# Patient Record
Sex: Male | Born: 1935 | Race: White | Hispanic: No | Marital: Married | Smoking: Former smoker
Health system: Southern US, Community
[De-identification: ages and names within clinical notes are randomized; demographics above are authoritative.]

## PROBLEM LIST (undated history)

## (undated) DIAGNOSIS — K219 Gastro-esophageal reflux disease without esophagitis: Secondary | ICD-10-CM

## (undated) DIAGNOSIS — Z860100 Personal history of colon polyps, unspecified: Secondary | ICD-10-CM

## (undated) DIAGNOSIS — M1990 Unspecified osteoarthritis, unspecified site: Secondary | ICD-10-CM

## (undated) DIAGNOSIS — F0390 Unspecified dementia without behavioral disturbance: Secondary | ICD-10-CM

## (undated) DIAGNOSIS — K5732 Diverticulitis of large intestine without perforation or abscess without bleeding: Secondary | ICD-10-CM

## (undated) DIAGNOSIS — I639 Cerebral infarction, unspecified: Secondary | ICD-10-CM

## (undated) DIAGNOSIS — K859 Acute pancreatitis without necrosis or infection, unspecified: Secondary | ICD-10-CM

## (undated) DIAGNOSIS — N138 Other obstructive and reflux uropathy: Secondary | ICD-10-CM

## (undated) DIAGNOSIS — I10 Essential (primary) hypertension: Secondary | ICD-10-CM

## (undated) DIAGNOSIS — R1312 Dysphagia, oropharyngeal phase: Secondary | ICD-10-CM

## (undated) DIAGNOSIS — J302 Other seasonal allergic rhinitis: Secondary | ICD-10-CM

## (undated) DIAGNOSIS — N401 Enlarged prostate with lower urinary tract symptoms: Secondary | ICD-10-CM

## (undated) DIAGNOSIS — J852 Abscess of lung without pneumonia: Secondary | ICD-10-CM

## (undated) DIAGNOSIS — E785 Hyperlipidemia, unspecified: Secondary | ICD-10-CM

## (undated) HISTORY — DX: Other seasonal allergic rhinitis: J30.2

## (undated) HISTORY — DX: Diverticulitis of large intestine without perforation or abscess without bleeding: K57.32

## (undated) HISTORY — DX: Abscess of lung without pneumonia: J85.2

## (undated) HISTORY — PX: APPENDECTOMY: SHX54

## (undated) HISTORY — DX: Benign prostatic hyperplasia with lower urinary tract symptoms: N13.8

## (undated) HISTORY — DX: Gastro-esophageal reflux disease without esophagitis: K21.9

## (undated) HISTORY — DX: Hyperlipidemia, unspecified: E78.5

## (undated) HISTORY — DX: Other obstructive and reflux uropathy: N40.1

## (undated) HISTORY — DX: Dysphagia, oropharyngeal phase: R13.12

## (undated) HISTORY — DX: Cerebral infarction, unspecified: I63.9

## (undated) HISTORY — DX: Personal history of colon polyps, unspecified: Z86.0100

## (undated) HISTORY — PX: CHOLECYSTECTOMY: SHX55

## (undated) HISTORY — DX: Unspecified osteoarthritis, unspecified site: M19.90

## (undated) HISTORY — DX: Acute pancreatitis without necrosis or infection, unspecified: K85.90

## (undated) HISTORY — DX: Personal history of colonic polyps: Z86.010

## (undated) HISTORY — DX: Essential (primary) hypertension: I10

---

## 1991-08-21 HISTORY — PX: BRONCHOSCOPY: SUR163

## 2000-05-06 ENCOUNTER — Encounter: Payer: Self-pay | Admitting: Endocrinology

## 2000-05-06 ENCOUNTER — Encounter: Admission: RE | Admit: 2000-05-06 | Discharge: 2000-05-06 | Payer: Self-pay | Admitting: Endocrinology

## 2000-05-09 ENCOUNTER — Ambulatory Visit: Admission: RE | Admit: 2000-05-09 | Discharge: 2000-05-09 | Payer: Self-pay | Admitting: Internal Medicine

## 2000-05-09 ENCOUNTER — Encounter (INDEPENDENT_AMBULATORY_CARE_PROVIDER_SITE_OTHER): Payer: Self-pay | Admitting: Specialist

## 2000-07-09 ENCOUNTER — Inpatient Hospital Stay (HOSPITAL_COMMUNITY): Admission: AD | Admit: 2000-07-09 | Discharge: 2000-07-10 | Payer: Self-pay | Admitting: Endocrinology

## 2000-07-09 ENCOUNTER — Encounter: Payer: Self-pay | Admitting: Endocrinology

## 2000-07-10 ENCOUNTER — Encounter: Payer: Self-pay | Admitting: Endocrinology

## 2000-07-16 ENCOUNTER — Ambulatory Visit (HOSPITAL_COMMUNITY): Admission: RE | Admit: 2000-07-16 | Discharge: 2000-07-16 | Payer: Self-pay | Admitting: Endocrinology

## 2001-04-08 ENCOUNTER — Encounter: Payer: Self-pay | Admitting: Endocrinology

## 2001-04-08 ENCOUNTER — Encounter: Admission: RE | Admit: 2001-04-08 | Discharge: 2001-04-08 | Payer: Self-pay | Admitting: Endocrinology

## 2001-11-04 ENCOUNTER — Encounter: Payer: Self-pay | Admitting: Endocrinology

## 2001-11-04 ENCOUNTER — Ambulatory Visit (HOSPITAL_COMMUNITY): Admission: RE | Admit: 2001-11-04 | Discharge: 2001-11-04 | Payer: Self-pay | Admitting: Endocrinology

## 2002-05-07 ENCOUNTER — Encounter (INDEPENDENT_AMBULATORY_CARE_PROVIDER_SITE_OTHER): Payer: Self-pay

## 2002-05-07 ENCOUNTER — Ambulatory Visit (HOSPITAL_COMMUNITY): Admission: RE | Admit: 2002-05-07 | Discharge: 2002-05-07 | Payer: Self-pay | Admitting: Gastroenterology

## 2004-08-28 ENCOUNTER — Ambulatory Visit (HOSPITAL_COMMUNITY): Admission: RE | Admit: 2004-08-28 | Discharge: 2004-08-28 | Payer: Self-pay | Admitting: Ophthalmology

## 2005-10-22 ENCOUNTER — Emergency Department (HOSPITAL_COMMUNITY): Admission: EM | Admit: 2005-10-22 | Discharge: 2005-10-22 | Payer: Self-pay | Admitting: Family Medicine

## 2007-04-08 ENCOUNTER — Observation Stay (HOSPITAL_COMMUNITY): Admission: EM | Admit: 2007-04-08 | Discharge: 2007-04-10 | Payer: Self-pay | Admitting: Emergency Medicine

## 2010-03-27 ENCOUNTER — Encounter: Admission: RE | Admit: 2010-03-27 | Discharge: 2010-03-27 | Payer: Self-pay | Admitting: Internal Medicine

## 2010-05-02 ENCOUNTER — Emergency Department (HOSPITAL_COMMUNITY): Admission: EM | Admit: 2010-05-02 | Discharge: 2010-05-02 | Payer: Self-pay | Admitting: Family Medicine

## 2010-07-13 ENCOUNTER — Emergency Department (HOSPITAL_COMMUNITY): Admission: EM | Admit: 2010-07-13 | Discharge: 2010-07-13 | Payer: Self-pay | Admitting: Emergency Medicine

## 2010-09-10 ENCOUNTER — Encounter: Payer: Self-pay | Admitting: Family Medicine

## 2010-10-31 LAB — CBC
HCT: 30.3 % — ABNORMAL LOW (ref 39.0–52.0)
MCV: 97.1 fL (ref 78.0–100.0)
Platelets: 280 10*3/uL (ref 150–400)
RBC: 3.12 MIL/uL — ABNORMAL LOW (ref 4.22–5.81)
RDW: 14.4 % (ref 11.5–15.5)
WBC: 6.4 10*3/uL (ref 4.0–10.5)

## 2010-10-31 LAB — DIFFERENTIAL
Basophils Absolute: 0 10*3/uL (ref 0.0–0.1)
Basophils Relative: 0 % (ref 0–1)
Eosinophils Absolute: 0.1 10*3/uL (ref 0.0–0.7)
Neutro Abs: 4.6 10*3/uL (ref 1.7–7.7)
Neutrophils Relative %: 71 % (ref 43–77)

## 2010-10-31 LAB — URINALYSIS, ROUTINE W REFLEX MICROSCOPIC
Hgb urine dipstick: NEGATIVE
Ketones, ur: NEGATIVE mg/dL
Urobilinogen, UA: 0.2 mg/dL (ref 0.0–1.0)

## 2010-10-31 LAB — COMPREHENSIVE METABOLIC PANEL
ALT: 29 U/L (ref 0–53)
AST: 20 U/L (ref 0–37)
Albumin: 3.1 g/dL — ABNORMAL LOW (ref 3.5–5.2)
Alkaline Phosphatase: 84 U/L (ref 39–117)
BUN: 15 mg/dL (ref 6–23)
CO2: 27 mEq/L (ref 19–32)
GFR calc Af Amer: >60 mL/min (ref >60)
Glucose, Bld: 112 mg/dL — ABNORMAL HIGH (ref 70–99)
Potassium: 3.7 mEq/L (ref 3.5–5.1)
Sodium: 137 mEq/L (ref 135–145)

## 2010-10-31 LAB — TYPE AND SCREEN

## 2010-11-16 ENCOUNTER — Encounter: Payer: Self-pay | Admitting: Internal Medicine

## 2010-11-16 ENCOUNTER — Ambulatory Visit (INDEPENDENT_AMBULATORY_CARE_PROVIDER_SITE_OTHER): Payer: Medicare Other | Admitting: Internal Medicine

## 2010-11-16 DIAGNOSIS — N400 Enlarged prostate without lower urinary tract symptoms: Secondary | ICD-10-CM | POA: Insufficient documentation

## 2010-11-16 DIAGNOSIS — Z860100 Personal history of colon polyps, unspecified: Secondary | ICD-10-CM

## 2010-11-16 DIAGNOSIS — E785 Hyperlipidemia, unspecified: Secondary | ICD-10-CM

## 2010-11-16 DIAGNOSIS — N138 Other obstructive and reflux uropathy: Secondary | ICD-10-CM

## 2010-11-16 DIAGNOSIS — M1990 Unspecified osteoarthritis, unspecified site: Secondary | ICD-10-CM | POA: Insufficient documentation

## 2010-11-16 DIAGNOSIS — Z8719 Personal history of other diseases of the digestive system: Secondary | ICD-10-CM | POA: Insufficient documentation

## 2010-11-16 DIAGNOSIS — R911 Solitary pulmonary nodule: Secondary | ICD-10-CM

## 2010-11-16 DIAGNOSIS — K219 Gastro-esophageal reflux disease without esophagitis: Secondary | ICD-10-CM

## 2010-11-16 DIAGNOSIS — I6350 Cerebral infarction due to unspecified occlusion or stenosis of unspecified cerebral artery: Secondary | ICD-10-CM

## 2010-11-16 DIAGNOSIS — K209 Esophagitis, unspecified without bleeding: Secondary | ICD-10-CM

## 2010-11-16 DIAGNOSIS — M129 Arthropathy, unspecified: Secondary | ICD-10-CM

## 2010-11-16 DIAGNOSIS — Z23 Encounter for immunization: Secondary | ICD-10-CM

## 2010-11-16 DIAGNOSIS — Z0000 Encounter for general adult medical examination without abnormal findings: Secondary | ICD-10-CM

## 2010-11-16 DIAGNOSIS — I10 Essential (primary) hypertension: Secondary | ICD-10-CM

## 2010-11-16 DIAGNOSIS — N401 Enlarged prostate with lower urinary tract symptoms: Secondary | ICD-10-CM

## 2010-11-16 DIAGNOSIS — H409 Unspecified glaucoma: Secondary | ICD-10-CM

## 2010-11-16 DIAGNOSIS — Z8673 Personal history of transient ischemic attack (TIA), and cerebral infarction without residual deficits: Secondary | ICD-10-CM | POA: Insufficient documentation

## 2010-11-16 DIAGNOSIS — I639 Cerebral infarction, unspecified: Secondary | ICD-10-CM

## 2010-11-16 MED ORDER — ZOSTER VACCINE LIVE 19400 UNT/0.65ML ~~LOC~~ SOLR
0.6500 mL | Freq: Once | SUBCUTANEOUS | Status: AC
  Administered 2010-11-16: 19400 [IU] via SUBCUTANEOUS

## 2010-11-16 NOTE — Progress Notes (Deleted)
Subjective:    Alexander Goodwin is a 77 y.o. male who presents to establish for on-going continuity care as a Dr. Dagoberto Ligas refugee.   He reports that he has had on-going right knee pain despite cortisone shots. He is having intra-orbital hemorrhage - seeing Dr. Eulah Pont for laser surgery.   He is for a annual medicare wellness exam. He has no specific medical issues to address today except as above.  He is 100% independent in all ADLs. He has had no falls and has mildly increased fall risk due to his bad knee. He has no signs or symptoms of depression. He remains very active and functional having just retired at age 60. His wife reports that he seems to have some memory problems which will be further evaluated at follow-up. He eats a healthy diet; does not smoke or drink. He does not exercise on a regular basis although he remains very active. Home safety: he has smoke alarms in the house; there are firearms in the house and he reports that ammunition is kept separate. There is no violence in the home. He has no risk factors for STDs, Hepatitis or HIV. He does see his dentist on a regular basis. He does not have excessive sun exposure. He has never had a blood transfusion. End of life care is discussed today.    Past Medical History  Diagnosis Date  . Diverticulitis of colon   . History of colon polyps   . Arthritis     right knee, hips, neck. He denies any major limitation in activities  . GERD (gastroesophageal reflux disease)     well controlled with medication. No nocturnal symptoms on a regular basis  . Glaucoma     both eyes, followed closely by Dr. Eulah Pont  . Esophagitis     Last EGD Fall '11   . Solitary lung nodule     LUL, stable over several years. s/p eval LHC-pulmonary  . Pancreatitis     resolved after GB surgery  . Stroke     TIAs - age 40  . BPH (benign prostatic hypertrophy) with urinary obstruction    Past Surgical History  Procedure Date  . Cholecystectomy     '88  - laparotomy ([place])  . Appendectomy     '88 along with GB   Family History  Problem Relation Age of Onset  . Diabetes Mother   . Hypertension Mother   . Heart disease Mother   . Stroke Father   . Heart disease Father   . Diabetes Father   . Cancer Sister     uterine cancer  . Cancer Brother   . COPD Brother   . Hypertension Brother   . Cancer Brother   . Cancer Sister     gyn malignancy  . Cancer Sister     pancreatic cancer   History   Social History  . Marital Status: Married    Spouse Name: N/A    Number of Children: N/A  . Years of Education: HSG   Occupational History  . 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 Smoking status: Former Smoker    Types: Cigarettes    Quit date: 08/20/1978  . Smokeless tobacco: Former Neurosurgeon    Quit date: 11/16/1990  . Alcohol Use: No  . Drug Use: No  . Sexually Active: Yes   Other Topics Concern  . Not on file   Social  History Narrative   HSG.   Married 1948. 3 sons - '52, '57,'58- 5 grandchildren, one deceased at 46 - OD.  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 Marriage in good health. End of Life Care -DNR/DNI,  No prolonged heroic or futile measures.     Review of Systems  Constitutional: Negative.  Negative for fever, chills, activity change and unexpected weight change.  HENT: Negative.  Negative for ear pain, congestion, neck stiffness and postnasal drip.  He may have mild high frequency hearing loss Eyes:  Negative for pain, discharge and visual disturbance. Has vitreal hemorrhage which is being treated  Respiratory: Negative.  Negative for chest tightness and wheezing.   Cardiovascular: Negative.  Negative for chest pain and palpitations.       [No decreased exercise tolerance Gastrointestinal: Negative.        [No change in bowel habit. No bloating or gas. No reflux or indigestion Genitourinary: Negative.  Negative for urgency,  frequency, flank pain and difficulty urinating.  Musculoskeletal: Negative.  Negative for myalgias, back pain, arthralgias and gait problem.  Neurological: Negative.  Negative for dizziness, tremors, weakness and headaches.  Hematological: Negative.  Negative for adenopathy.  Psychiatric/Behavioral: Negative for behavioral problems and dysphoric mood.  [all other systems reviewed and are negative

## 2010-11-17 ENCOUNTER — Encounter: Payer: Self-pay | Admitting: Internal Medicine

## 2010-11-17 ENCOUNTER — Telehealth: Payer: Self-pay | Admitting: Internal Medicine

## 2010-11-17 DIAGNOSIS — I10 Essential (primary) hypertension: Secondary | ICD-10-CM | POA: Insufficient documentation

## 2010-11-17 DIAGNOSIS — E785 Hyperlipidemia, unspecified: Secondary | ICD-10-CM | POA: Insufficient documentation

## 2010-11-17 NOTE — Progress Notes (Signed)
Subjective:    Patient ID: Alexander Goodwin, male    DOB: 04-10-1921, 77 y.o.   MRN: [id number]  HPI    Subjective:    Alexander Goodwin is a 77 y.o. male who presents to establish for on-going continuity care as a Dr. Dagoberto Ligas refugee.   He reports that he has had on-going right knee pain despite cortisone shots. He is having intra-orbital hemorrhage - seeing Dr. Eulah Pont for laser surgery.   He is for a annual medicare wellness exam. He has no specific medical issues to address today except as above.  He is 100% independent in all ADLs. He has had no falls and has mildly increased fall risk due to his bad knee. He has no signs or symptoms of depression. He remains very active and functional having just retired at age 78. His wife reports that he seems to have some memory problems which will be further evaluated at follow-up. He eats a healthy diet; does not smoke or drink. He does not exercise on a regular basis although he remains very active. Home safety: he has smoke alarms in the house; there are firearms in the house and he reports that ammunition is kept separate. There is no violence in the home. He has no risk factors for STDs, Hepatitis or HIV. He does see his dentist on a regular basis. He does not have excessive sun exposure. He has never had a blood transfusion. End of life care is discussed today.    Past Medical History  Diagnosis Date  . Diverticulitis of colon   . History of colon polyps   . Arthritis     right knee, hips, neck. He denies any major limitation in activities  . GERD (gastroesophageal reflux disease)     well controlled with medication. No nocturnal symptoms on a regular basis  . Glaucoma     both eyes, followed closely by Dr. Eulah Pont  . Esophagitis     Last EGD Fall '11   . Solitary lung nodule     LUL, stable over several years. s/p eval LHC-pulmonary  . Pancreatitis     resolved after GB surgery  . Stroke     TIAs - age 44  . BPH (benign prostatic  hypertrophy) with urinary obstruction    Past Surgical History  Procedure Date  . Cholecystectomy     '88 - laparotomy ([place])  . Appendectomy     '88 along with GB   Family History  Problem Relation Age of Onset  . Diabetes Mother   . Hypertension Mother   . Heart disease Mother   . Stroke Father   . Heart disease Father   . Diabetes Father   . Cancer Sister     uterine cancer  . Cancer Brother   . COPD Brother   . Hypertension Brother   . Cancer Brother   . Cancer Sister     gyn malignancy  . Cancer Sister     pancreatic cancer   History   Social History  . Marital Status: Married    Spouse Name: N/A    Number of Children: N/A  . Years of Education: HSG   Occupational History  . 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 Smoking status: Former Smoker    Types: Cigarettes    Quit date: 08/20/1978  . Smokeless tobacco: Former Neurosurgeon    Quit date: 11/16/1990  .  Alcohol Use: No  . Drug Use: No  . Sexually Active: Yes   Other Topics Concern  . Not on file   Social History Narrative   HSG.   Married 1948. 3 sons - '52, '57,'58- 5 grandchildren, one deceased at 37 - OD.  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 Marriage in good health. End of Life Care -DNR/DNI,  No prolonged heroic or futile measures.     Review of Systems  Constitutional: Negative.  Negative for fever, chills, activity change and unexpected weight change.  HENT: Negative.  Negative for ear pain, congestion, neck stiffness and postnasal drip.  He may have mild high frequency hearing loss Eyes:  Negative for pain, discharge and visual disturbance. Has vitreal hemorrhage which is being treated  Respiratory: Negative.  Negative for chest tightness and wheezing.   Cardiovascular: Negative.  Negative for chest pain and palpitations.       [No decreased exercise tolerance Gastrointestinal: Negative.        [No change in  bowel habit. No bloating or gas. No reflux or indigestion Genitourinary: Negative.  Negative for urgency, frequency, flank pain and difficulty urinating.  Musculoskeletal: Negative.  Negative for myalgias, back pain, arthralgias and gait problem.  Neurological: Negative.  Negative for dizziness, tremors, weakness and headaches.  Hematological: Negative.  Negative for adenopathy.  Psychiatric/Behavioral: Negative for behavioral problems and dysphoric mood.  [all other systems reviewed and are negative        Review of Systems     Objective:   Physical Exam  Nursing note and vitals reviewed. Constitutional: He is oriented to person, place, and time. He appears well-developed and well-nourished.       Elderly white male looking younger than his stated age.  HENT:  Head: Normocephalic and atraumatic.       Mild cerumen accumulation bilaterally but TMs remain visible  Eyes: Conjunctivae and EOM are normal. Pupils are equal, round, and reactive to light.  Neck: Normal range of motion. Neck supple. No thyromegaly present.  Cardiovascular: Normal rate, regular rhythm and normal heart sounds.   Pulmonary/Chest: Effort normal and breath sounds normal.  Abdominal: Soft.  Musculoskeletal: Normal range of motion. He exhibits no edema and no tenderness.  Neurological: He is alert and oriented to person, place, and time. He has normal reflexes.  Skin: Skin is warm and dry.       Chronic skin changes with areas of erythema. On the left cheek is a slightly raised red area that appears benign  Psychiatric: He has a normal mood and affect. His behavior is normal.          Assessment & Plan:  1. Arthritis - chronic knee pain and problems followed by Dr. Ranell Patrick. Mr. Chap is not a candidate for total knee replacement but may benefit from hyaluronidase injections.  Plan - defer to orthopedics  2. GERD - stable on present medications  3. Hypertension - well controlled on present medications.  He has had recent labs by Dr. Mertie Moores pending.  4. BPH - stable with minimal nocturia  5. Hyperlipidemia - medical therapy was started at time of CVA/TIA. Will discuss at future visits the overall benefits and risks of continuing medication.  6. Mental status - Mrs. Scheier has raised the question about memory loss.  Plan - will perform a MMSE at next office visit.   In summary - a very nice man who is establishing for on-going medical care. He will return in  the near future for further evaluation.

## 2010-11-17 NOTE — Telephone Encounter (Signed)
Forwarded to Dr. Norins for review. °

## 2010-12-15 ENCOUNTER — Ambulatory Visit (INDEPENDENT_AMBULATORY_CARE_PROVIDER_SITE_OTHER): Payer: Medicare Other | Admitting: Internal Medicine

## 2010-12-15 ENCOUNTER — Encounter: Payer: Self-pay | Admitting: Internal Medicine

## 2010-12-15 DIAGNOSIS — E785 Hyperlipidemia, unspecified: Secondary | ICD-10-CM

## 2010-12-15 DIAGNOSIS — R413 Other amnesia: Secondary | ICD-10-CM

## 2010-12-15 NOTE — Progress Notes (Signed)
  Subjective:    Patient ID: Alexander Goodwin, male    DOB: [birth year], 77 y.o.   MRN: [id number]  HPI Patient returns for follow-up. He has received the note from his new patient visit and has no corrections or additions.  We are also to discuss continuation of anti-lipemic medication and to do a MMSE  PMH, FamHx and SocHx reviewed for any changes and relevance.   Review of Systems No change from last visit. All reviewed systems negative except for some arthritic stiffness    Objective:   Physical Exam MMSE: 1. Day,date,year - OK on all 2. Content: president- ok   Gov. -ok    Current events -ok for general information 3. Number repitition: 5 fwd - 1 error   5 rev - no, 4 rev - 2 errors      World reversed - no 4. 3 word recall - 0/3 @ 5 min. 5. Serial 7's -ok      , nickles in $1.25-  ok     Change making - 1 error but corrected on second try 6. Naming objects -   ok       4 legged creatures- ok 7. Parables:  [place] - poor/concrete      Rolling stone - contcrete 8. Judgement:  Letter  Fair but not just right        [employer]  9. Clock face exercise - fluid        Assessment & Plan:  1. Memory assesment -OK, mild short-term memory loss. Not severe enough to warrant medication.   2. Hyperlipidemia - at age 19 we discussed the relative value of continuing anti-lipemic medication in regard to reducing the risk of progressive atherosclerosis. Little evidence to suggest that medication will make a significant difference at his age.

## 2011-01-02 NOTE — H&P (Signed)
Alexander Goodwin, Alexander Goodwin                ACCOUNT NO.:  [id number]   MEDICAL RECORD NO.:  [id number]          PATIENT TYPE:  INP   LOCATION:  5733                         FACILITY:  [facility]   PHYSICIAN:  Alfonse Alpers. Gegick, M.D.DATE OF BIRTH:  12/12/1920   DATE OF ADMISSION:  04/08/2007  DATE OF DISCHARGE:                              HISTORY & PHYSICAL   CHIEF COMPLAINT:  This is an 77 year old man who presents with a history  of GI bleeding.   HISTORY OF PRESENT ILLNESS:  The patient has had a history of family  carcinoma of the colon.  His brother had carcinoma of the colon.  He has  had a colonoscopy done in the past, and had a colonoscopy done one week  ago.  The colonoscopy showed three polyps that were removed.  He  presents now with having blood in his bowel movements which was  described as being dark.  His hemoglobin previously was 10.1 which is a  recent change that was being explored.  The patient denies any history  of pain in his abdomen.  He was taking Aciphex before admission.   He has a history of benign prostatic hypertrophy and currently is taking  Cardura for this with good results.   He has a history of hyperthyroidism.  This is mild and consistent with  Hashi-toxicosis.  The radioactive iodine uptake was 9%.  He was treated  with PTU with 50 mg.  He has euthyroid.   He has a history of aspergillosis.  This was confirmed by biopsy in  1993.   He has a history of transient ischemic attack in the past.  Had a global  amnesia episode.  No recent findings.  In January 1998, he had a MRI  brain scan which showed small vessel disease.   He has a history of anemia.  His hemoglobins had been doing well.  They  were in the 13 range, and then he had drop to 11.  He was probably  taking Mobic at that time, and stopped it, and his hemoglobin went back  up to 12.4.  And gradually, his hemoglobin has decreased again to a  level of 10.1 which is being evaluated at this time.  He  has a history  of impaired glucose tolerance.  A random sugar was 140.   PAST MEDICAL HISTORY:  Essentially as noted above.   MEDICATIONS PRIOR TO THIS ADMISSION:  1. Cardura 4 mg two h.s.  2. Propylthiouracil 50 mg one daily.  3. Enteric-coated aspirin 81 mg one daily.  4. Lipitor 10 mg each day (20 mg tablets cut in half).  5. Norvasc 5 mg one daily.  6. Aciphex 20 mg one daily.  7. Lorcet 10 mg p.r.n.  8. Enablex.   PERSONAL HISTORY:  Does not smoke or drink excessive amounts of alcohol.  No history of allergies at present.   REVIEW OF SYSTEMS:  His weight is very variable.  For the summer, he  routinely looses weight and this has been true again for approximately 3-  5 pounds.  CARDIOVASCULAR/RESPIRATORY:  No complaints.  GI: See above.  GU:  See above.   PHYSICAL EXAMINATION:  GENERAL:  A well-developed elderly man who  appears in no distress.  NECK:  Supple.  LUNGS:  Clear.  CARDIOVASCULAR:  Rhythm is regular.  ABDOMEN:  Soft.  No masses are present.  No tenderness is present.  No  organomegaly is present.  EXTREMITIES: No pedal edema.  NEUROMUSCULAR:  Essentially normal.   IMPRESSION:  1. GI bleeding probably secondary to gastric disease.  2. Hyperthyroidism (controlled with propylthiouracil).  3. Aspergillosis.  4. History of anemia.           ______________________________  Alfonse Alpers. Dagoberto Ligas, M.D.     CGG/MEDQ  D:  04/08/2007  T:  04/08/2007  Job:  [id number]

## 2011-01-02 NOTE — Discharge Summary (Signed)
NAME:  Alexander Goodwin, Alexander Goodwin                ACCOUNT NO.:  [id number]   MEDICAL RECORD NO.:  [id number]          PATIENT TYPE:  OBV   LOCATION:  5733                         FACILITY:  [facility]   PHYSICIAN:  Alfonse Alpers. Gegick, M.D.DATE OF BIRTH:  28-Feb-1921   DATE OF ADMISSION:  04/07/2007  DATE OF DISCHARGE:  04/10/2007                               DISCHARGE SUMMARY   HISTORY:  This is an 77 year old man who presents with a history of GI  bleeding.  The patient has had a history of carcinoma of the colon in  his family history.  His brother had the cancer.  He was having his  routine colonoscopy done at this time, and he has three polyps removed  from the cecal area.  The patient tolerated the procedure well and had  no difficulty.   Four days later, he presented to the emergency room with bloody stools  which were considered to be dark bloody.  The patient denies any history  of abdominal pain.  He does have a history of a mild anemia with a  hemoglobin of 10.5 prior to his problem.  This was being evaluated.   He has a history of several medical problems which include benign  prostatic hypertrophy, and he is taking Cardura for that.  He also has a  history of hyperthyroidism, which is being treated with propylthiouracil  and a history of aspergillosis.   The medications prior to admission included Cardura 4 mg two h.s.,  propylthiouracil 50 mg one daily, enteric-coated aspirin 81 mg one  daily, Lipitor 10 mg daily, Norvasc 5 mg one daily, Aciphex 20 mg one  daily and Lorcet p.r.n.   PERSONAL HISTORY:  He does not smoke or drink excessive amounts of  alcohol.   No history of allergies.   PHYSICAL EXAMINATION:  GENERAL:  Physical exam revealed a well-developed  man in no distress.  His lungs were clear.  CARDIOVASCULAR:  Rhythm was regular.  ABDOMEN:  Abdomen was soft.  EXTREMITIES:  No edema was present.   IMPRESSION ON ADMISSION:  1. Gastrointestinal (GI) bleeding, probably from  the polyp site that      previously had surgery.  2. History of hypertension.  3. Benign prostatic hypertrophy.  4. Aspergillosis.   HOSPITAL COURSE:  He was admitted to the hospital.  His hemoglobins were  monitored.  His hemoglobin decreased from 10.0 to 9 and then decreased  again to 8.6.  He is essentially asymptomatic, but the hemoglobin of 8.6  was somewhat worrisome and he was then given a unit of blood.  He is not  having any more active bleeding at this time.  He will then be  discharged after receiving 1 unit of blood.   DISCHARGE DIAGNOSES:  1. Gastrointestinal (GI) bleeding, probably secondary to the previous      polypectomy.  2. Anemia, combination of iron deficiency and also gastrointestinal      (GI) bleeding.  3. History of hyperthyroidism.  4. History of aspergillosis.   DISCHARGE MEDICATIONS:  He will continue taking:  1. Propylthiouracil 50 mg one daily.  2.  Lipitor 10 mg one daily.  3. Aciphex 20 mg one daily.   The Cardura, aspirin, and Norvasc will be held.   FOLLOWUP PLAN:  He will be seen tomorrow morning in the office of 8:30  in the morning, at which time a repeat hemoglobin will be done.   DISCHARGE DIET:  As tolerated.           ______________________________  Alfonse Alpers. Dagoberto Ligas, M.D.     CGG/MEDQ  D:  04/10/2007  T:  04/11/2007  Job:  [id number]

## 2011-01-02 NOTE — Consult Note (Signed)
Alexander Goodwin, Alexander Goodwin                ACCOUNT NO.:  [id number]   MEDICAL RECORD NO.:  [id number]          PATIENT TYPE:  INP   LOCATION:  5733                         FACILITY:  [facility]   PHYSICIAN:  Griffith Citron, M.D.DATE OF BIRTH:  09/04/1920   DATE OF CONSULTATION:  04/08/2007  DATE OF DISCHARGE:                                 CONSULTATION   GASTROENTEROLOGY CONSULTATION:   REASON FOR CONSULTATION:  An 77 year old black male admitted with a GI  hemorrhage.   HISTORY OF PRESENT ILLNESS:  The patient underwent colonoscopic  polypectomy April 03, 2007, five days ago, for surveillance with a  prior history of a right colon tubular villous adenoma.  His procedure  was notable for 3 polyps, all in the right colon.  The largest was a  tubular villous adenoma in the cecum.  All 3 were resected with snare  technique.  There was no immediate complication, and hemostasis was  maintained throughout the procedure.   The patient did well over the next 4-1/2 days.  However, beginning  yesterday around lunch time, he had an episode of diarrhea and noted  dark red stool.  The bowel movements became progressively darker, and he  had several prior to calling.  When I spoke with the patient yesterday,  he was weak, minimally dizzy without abdominal pain, nausea or vomiting,  fever, rigor, chill, or diaphoresis.  He was instructed to travel by  ambulance to the emergency room, which he did.  On arrival, he was  hemodynamically stable, found to have a hemoglobin of approximately 11  with a benign physical exam.  He was observed in the ER and shortly  after midnight was admitted because a follow-up hemoglobin had fallen to  10.  Given his advanced age, multiple comorbidities, it was felt safest  to admit the patient for closer observation and possible transfusion.   Since admission, the patient has not had any further bowel movements.  Most recent hemoglobin now 24 hours after the onset of  symptoms is  hemoglobin 9.2.  He is comfortable, resting in bed, no abdominal pain,  no nausea and vomiting, not particularly hungry.  No complaints of  weakness or dizziness, but the patient has been on bed rest since  admission.  Again, vital signs have been stable without fever.   PHYSICAL EXAMINATION:  GENERAL:  Elderly, frail, otherwise healthy,  alert, oriented, full affect, normal mood.  HEENT:  Mild conjunctival pallor and icteric sclerae.  NECK:  Supple.  No adenopathy or thyromegaly.  CHEST:  Clear.  CARDIAC:  Regular rhythm.  No gallop or murmur.  ABDOMEN:  Soft, nondistended, nontender, active bowel sounds.  No  organomegaly, no mass, no firmness.  RECTAL:  Not performed.  EXTREMITIES:  Without edema or cyanosis.   ASSESSMENT:  Lower gastrointestinal hemorrhage secondary to colonoscopic  snare polypectomy.  No reason to implicate upper gastrointestinal  pathology.  The patient has been maintained on Aciphex, has no symptoms  of dyspepsia or reflux.  Although the stool is dark and close to being  melanotic, I suspect this is a function of  slow transit of right-sided  gastrointestinal bleeding.  I think the patient's current clinical  course suggests that his bleeding is stopping, if it has not already  stopped, and he has remained stable.  I do not think further  intervention is required at this point in time.   RECOMMENDATION:  1. Advance diet tomorrow if clinically stable.  2. Advance activity level if clinically stable tomorrow.  3. Monitor H/H--aggressively transfuse if hemoglobin falls below 9.0.  4. Consider discharge April 10, 2007 depending on clinical course.  5. Remain available for further consultation.      Griffith Citron, M.D.  Electronically Signed     JRM/MEDQ  D:  04/08/2007  T:  04/09/2007  Job:  [id number]   cc:   Alfonse Alpers. Dagoberto Ligas, M.D.

## 2011-01-05 NOTE — Op Note (Signed)
NAME:  Alexander Goodwin, Alexander Goodwin                ACCOUNT NO.:  [id number]   MEDICAL RECORD NO.:  [id number]          PATIENT TYPE:  OIB   LOCATION:  2550                         FACILITY:  [facility]   PHYSICIAN:  Alford Highland. Rankin, M.D.   DATE OF BIRTH:  July 02, 1921   DATE OF PROCEDURE:  08/28/2004  DATE OF DISCHARGE:                                 OPERATIVE REPORT   PREOPERATIVE DIAGNOSIS:  1.  Epiretinal membrane, right eye.  2.  Subretinal hemorrhage, right eye.   POSTOPERATIVE DIAGNOSIS:  1.  Epiretinal membrane, right eye.  2.  Subretinal hemorrhage, right eye.  3.  Peripheral vasculopathy, right eye.   OPERATION PERFORMED:  1.  Posterior vitrectomy with membrane peel, epiretinal as well as internal      limiting membrane, right eye.  2.  Endolaser photocoagulation around areas of obvious vascular disease      peripherally 360 degrees.   SURGEON:  Alford Highland. Rankin, M.D.   ANESTHESIA:  Local retrobulbar, monitored anesthesia control.   COMPLICATIONS:  None.   ESTIMATED BLOOD LOSS:  Essentially no blood loss.   INDICATIONS FOR PROCEDURE:  The patient is a 77 year old man who understands  this is an attempt to improve visual functioning in this right eye on the  basis of loss of epiretinal  membrane and macular topographic distortion.  The patient understands the risks of anesthesia including the rare  occurrence of death but also to the eye including but not limited to  hemorrhage, infection, scarring, need for another surgery, no change in  vision, loss of vision and progression of disease despite intervention.  After appropriate signed consent was obtained, the patient was taken to the  operating room.   DESCRIPTION OF PROCEDURE:  In the operating room, preoperative monitoring  was followed by mild sedation.  0.75% Marcaine was delivered 5 mL  retrobulbar followed by an additional 5 mL laterally in the fashion of  modified Darel Hong.  The right periocular region was sterilely prepped  and  draped in the usual ophthalmic fashion.  A lid speculum was applied.  Conjunctival peritomy was fashioned temporally and supranasally.  A 4 mm  infusion secured 3.5 mm posterior to the limbus in the infratemporal  quadrant.  Placement in the vitreous cavity verified visually.  Superior  sclerotomies then fashioned.  Wild microscope was placed in position with  the Biomet attachment.  Core vitrectomy was then begun.  Vitreous was then  trimmed 360 degrees.  Endolaser panphotocoagulation was placed around the  areas of subretinal hemorrhage anterior to the equator located at the 9  o'clock position in this right eye.  This and further areas of obvious  microvascular damage anterior to the equator were noted 360 degrees and  these were treated with focal and panretinal photocoagulation.  At this time  [facility] forceps were then used to engage the epiretinal membrane temporal to  the fovea.  This was removed in a continuous sheet without difficulty from  the entire macular region. No complications occurred.  No obvious macular  holes were identified.  There was no reason to put gas  in the eye.  At this  time superior sclerotomies were then closed with 7-0 Vicryl.  The peripheral  retina was inspected and found to be free of retinal holes or tears.  The  infusion removed and similarly closed with 7-0 Vicryl.  Conjunctiva closed  with 7-0 Vicryl.  Subconjunctival injection of antibiotic and steroid  applied.  Sterile patch and Fox shield applied.  The patient was then  transferred to the recovery room in good and stable condition.      GAR/MEDQ  D:  08/28/2004  T:  08/28/2004  Job:  [id number]   cc:   Herby Abraham., M.D.  [address] [place]  [place] [address]  Fax: [phone]

## 2011-01-05 NOTE — Procedures (Signed)
[facility]  Patient:    CHRISTIANO, BLANDON                       MRN: [id number] Proc. Date: 05/09/00 Adm. Date:  [id number] Attending:  Avie Echevaria CC:         Alfonse Alpers. Dagoberto Ligas, M.D.                           Procedure Report  DATE OF BIRTH:  23-Jan-1921.  REFERRING PHYSICIAN:  Alfonse Alpers. Dagoberto Ligas, M.D.  PROCEDURE:  Fiberoptic bronchoscopy with a transbronchial biopsy of the left upper lobe and bronchial brushings as well as washings.  SURGEON:  Charlaine Dalton. Sherene Sires, M.D.  HISTORY AND INDICATIONS:  Please see comprehensive pulmonary consultation note performed in the office yesterday.  Briefly, this is a 77 year old white male with a history of chronic changes in the left upper lobe, now presenting with hemoptysis and cavitary formation in the left upper lobe, differential diagnosis being granulomatous disease and malignancy as well as superinfection of a cavity with mycetoma.  There has been no change since yesterday in terms of his exam.  Patient agreed to the procedure after full discussion of the risks, benefits and alternatives.  DESCRIPTION OF PROCEDURE:  He maintained greater than 90% saturation throughout the procedure and sinus rhythm.  He received a total of Versed 2.5 mg IV and Demerol 50 mg IV for adequate sedation and cough suppression.  The right naris and oropharynx were liberally anesthetized with 10% lidocaine spray; the right naris was additionally prepared with 2% lidocaine jelly.  Using a standard flexible fiberoptic bronchoscope, the right naris was easily cannulated, with good visualization of the entire oropharynx and larynx.  The cords moved normally and there were no apparent upper airway lesions.  Using an additional 1% lidocaine as needed, the entire tracheobronchial tree was explored bilaterally with the following findings:  #1 - Trachea, carina and all the major airways were widely patent with no focal  endobronchial lesions.  The exception was the left upper lobe which was concentrically narrowed to the point where it was difficult to selectively cannulate the segments; however, there were no focal endobronchial lesions.  I attempted to direct the bronchoscope into the apicoposterior segment of the left upper lobe, with limited access to the cavity.  I biopsied what I believed was the lateral inferior wall of the cavity but could not directly access this lesion.  Additionally, I was able to reach the same aspect of the cavity with the brush.  I could not direct the bronchoscope to the medial aspect of this lesion; I therefore lavaged the left upper lobe vigorously and terminated the procedure at this point, with minimal bleeding encountered.  Followup chest x-ray is pending.  Samples were sent as above and will be checked when available.  If he continues to have episodes of hemoptysis, we may need to consider a left upper lobectomy at some point, assuming that he is a good operative candidate for pulmonary resection, which has yet to be evaluated. DD:  05/09/00 TD:  05/10/00 Job: [id number]

## 2011-01-05 NOTE — Discharge Summary (Signed)
. [facility]  Patient:    Alexander Goodwin, Alexander Goodwin                       MRN: [id number] Adm. Date:  [id number] Disc. Date: [id number] Attending:  Melody Haver                           Discharge Summary  HISTORY OF PRESENT ILLNESS:  This is a 77 year old man who has a history of dyslipidemia and also has had a history of intermittent episodes of confusion associated with hypertension.  The patient has had this evaluated in the past and he has had carotid artery Dopplers in the past, and also an MRI brain scan, the most recent which was in 1998.  He has been in his usual state of health without a history of hypertension and he presents now with an episode of confusion.  This is similar to the episode he has had in the past.  When he was examined in the office his blood pressure was noted to be markedly elevated with a systolic blood pressure of 200.  In view of the fact that this was a new event associated with the hypertension, he was admitted to the hospital for observation.  He also has a history of a cavitary lesion in his left upper lung which has been considered to be aspergillosis.  He has had only occasional hemoptysis with this recently.  His physical examination on admission revealed a well-developed man who appeared in no distress.  His confusion was present. Neuromuscular examination showed no specific findings other than the confusion.  IMPRESSION ON ADMISSION: 1. Hypertension (episodic). 2. Transient ischemic attack. 3. History of dyslipidemia. 4. History of aspergillosis.  HOSPITAL COURSE:  He was admitted to the hospital for observation.  He was given 2.5 mg of Norvasc at night and within an hour that completely cleared his symptoms.  The morning of his first hospitalization day he was completely asymptomatic.  His blood pressure had decreased.  It was decided to send the patient home and do the remainder of the evaluation so  therefore he was then discharged.  IMPRESSION ON DISCHARGE: 1. Transient ischemic attack. 2. Hypertension (episodic--rule out pheochromocytoma with laboratory    studies to be done in the future). 3. Dyslipidemia.  MEDICATIONS:  He will continue taking the same medications that he has been taking, but add Norvasc 2.5 mg q.d. and also Cardura 4 mg q.d. will be continued.  DIET:  His diet on discharge will be no added salt.  ACTIVITY:  Activity on discharge, as tolerated.  PLAN OF FOLLOWUP:  He will be seen in the office on Monday.  He will also have his brain scan and carotid Dopplers done. DD:  07/10/00 TD:  07/11/00 Job: [id number]

## 2011-03-06 ENCOUNTER — Encounter: Payer: Self-pay | Admitting: Internal Medicine

## 2011-03-12 ENCOUNTER — Other Ambulatory Visit: Payer: Self-pay | Admitting: Ophthalmology

## 2011-03-16 ENCOUNTER — Encounter: Payer: Self-pay | Admitting: Internal Medicine

## 2011-03-16 ENCOUNTER — Ambulatory Visit (INDEPENDENT_AMBULATORY_CARE_PROVIDER_SITE_OTHER): Payer: Medicare Other | Admitting: Internal Medicine

## 2011-03-16 DIAGNOSIS — N401 Enlarged prostate with lower urinary tract symptoms: Secondary | ICD-10-CM

## 2011-03-16 DIAGNOSIS — I10 Essential (primary) hypertension: Secondary | ICD-10-CM

## 2011-03-16 DIAGNOSIS — N139 Obstructive and reflux uropathy, unspecified: Secondary | ICD-10-CM

## 2011-03-16 DIAGNOSIS — E785 Hyperlipidemia, unspecified: Secondary | ICD-10-CM

## 2011-03-16 NOTE — Progress Notes (Signed)
  Subjective:    Patient ID: Alexander Goodwin, male    DOB: 10-Aug-1921, 77 y.o.   MRN: [id number]  HPI Alexander Goodwin reutrns for follow-up being last seen June 12. He had surgery Monday for an eye lesion and has done well. He had a palpebral lesion left lower eyelid. He does have some bruising. He is doing  Well otherwise.  He did receive and review the complete note from his first visit and has no additions or corrections.  PMH, FamHx and SocHx reviewed for any changes and relevance.   Review of Systems  Constitutional: Negative.   HENT: Negative.   Eyes: Positive for discharge.       Recent eye surgery left. No signs of complications  Respiratory: Negative.   Cardiovascular: Negative.   Neurological: Negative.   Hematological: Negative.        Objective:   Physical Exam Vitals - reviewed and stable Gen'l - elderly white man in no distress HEENT - [place]/AT, left lower eyelid mildly erythematous with a free suture end. No drainage. Mild infra-orbital bruising Nec - supple Chest - mild kyphosis Lungs - CTAP Cor - 2+ radial pulse, RRR Abdomen - BS+, soft Extremities - no deformity, no joint swelling or erythema Neuro -= A&O x 3, CN II-XII intact, DTR's 2+ patellar, biceps       Assessment & Plan:   No problem-specific assessment & plan notes found for this encounter.

## 2011-03-17 NOTE — Assessment & Plan Note (Signed)
BP Readings from Last 3 Encounters:  03/16/11 122/68  12/15/10 120/60  11/16/10 110/58   Continued good control. No change in regimen

## 2011-03-17 NOTE — Assessment & Plan Note (Signed)
Long standing problem. He has not had any retention problems.

## 2011-03-17 NOTE — Assessment & Plan Note (Signed)
No lab results available. Will review when available and order appropriate lab at that time.

## 2011-04-09 ENCOUNTER — Other Ambulatory Visit: Payer: Self-pay | Admitting: *Deleted

## 2011-04-09 MED ORDER — DOXAZOSIN MESYLATE 8 MG PO TABS: 8.0000 mg | ORAL_TABLET | Freq: Every day | ORAL | Status: DC

## 2011-04-11 ENCOUNTER — Other Ambulatory Visit: Payer: Self-pay | Admitting: Internal Medicine

## 2011-05-17 ENCOUNTER — Other Ambulatory Visit (INDEPENDENT_AMBULATORY_CARE_PROVIDER_SITE_OTHER): Payer: Medicare Other

## 2011-05-17 ENCOUNTER — Other Ambulatory Visit: Payer: Self-pay | Admitting: Internal Medicine

## 2011-05-17 DIAGNOSIS — K219 Gastro-esophageal reflux disease without esophagitis: Secondary | ICD-10-CM

## 2011-05-17 DIAGNOSIS — I10 Essential (primary) hypertension: Secondary | ICD-10-CM

## 2011-05-18 LAB — CBC WITH DIFFERENTIAL/PLATELET
Basophils Relative: 1.5 % (ref 0.0–3.0)
Eosinophils Absolute: 0.1 10*3/uL (ref 0.0–0.7)
Lymphocytes Relative: 28.3 % (ref 12.0–46.0)
Lymphs Abs: 1.6 10*3/uL (ref 0.7–4.0)
MCV: 98.6 fl (ref 78.0–100.0)
Monocytes Absolute: 0.6 10*3/uL (ref 0.1–1.0)
Neutro Abs: 3.3 10*3/uL (ref 1.4–7.7)
Platelets: 195 10*3/uL (ref 150.0–400.0)
WBC: 5.7 10*3/uL (ref 4.5–10.5)

## 2011-05-18 LAB — BASIC METABOLIC PANEL
CO2: 30 mEq/L (ref 19–32)
Calcium: 9.2 mg/dL (ref 8.4–10.5)
Chloride: 108 mEq/L (ref 96–112)

## 2011-05-21 ENCOUNTER — Encounter: Payer: Self-pay | Admitting: Internal Medicine

## 2011-06-01 LAB — I-STAT 8, (EC8 V) (CONVERTED LAB)
Glucose, Bld: 138 — ABNORMAL HIGH
Potassium: 4.8
TCO2: 19
pCO2, Ven: 25.1 — ABNORMAL LOW
pH, Ven: 7.465 — ABNORMAL HIGH

## 2011-06-01 LAB — TYPE AND SCREEN: ABO/RH(D): O POS

## 2011-06-01 LAB — CBC
HCT: 26.8 — ABNORMAL LOW
HCT: 27.1 — ABNORMAL LOW
Hemoglobin: 8.4 — ABNORMAL LOW
Hemoglobin: 9 — ABNORMAL LOW
Hemoglobin: 9 — ABNORMAL LOW
MCHC: 32.7
MCHC: 32.8
MCHC: 32.8
MCHC: 33.1
MCHC: 33.2
MCV: 84.8
MCV: 85
MCV: 85.2
Platelets: 186
Platelets: 192
Platelets: 193
Platelets: 206
Platelets: 218
RBC: 3.06 — ABNORMAL LOW
RBC: 3.14 — ABNORMAL LOW
RBC: 3.23 — ABNORMAL LOW
RBC: 3.57 — ABNORMAL LOW
RDW: 20.5 — ABNORMAL HIGH
RDW: 20.8 — ABNORMAL HIGH
RDW: 21.1 — ABNORMAL HIGH
WBC: 4.3
WBC: 4.7
WBC: 4.9
WBC: 5.5
WBC: 6.8

## 2011-06-01 LAB — COMPREHENSIVE METABOLIC PANEL
ALT: 13
AST: 18
CO2: 28
Chloride: 110
Creatinine, Ser: 1
GFR calc Af Amer: >60
GFR calc non Af Amer: >60
Sodium: 142
Total Bilirubin: 0.9

## 2011-06-01 LAB — PREPARE RBC (CROSSMATCH)

## 2011-06-01 LAB — PROTIME-INR: Prothrombin Time: 14.2

## 2011-06-05 ENCOUNTER — Other Ambulatory Visit: Payer: Self-pay | Admitting: Internal Medicine

## 2011-06-05 DIAGNOSIS — Z760 Encounter for issue of repeat prescription: Secondary | ICD-10-CM

## 2011-06-11 ENCOUNTER — Ambulatory Visit (INDEPENDENT_AMBULATORY_CARE_PROVIDER_SITE_OTHER): Payer: Medicare Other | Admitting: Internal Medicine

## 2011-06-11 ENCOUNTER — Encounter: Payer: Self-pay | Admitting: Internal Medicine

## 2011-06-11 VITALS — BP 130/70 | HR 72 | Temp 97.4°F | Resp 16 | Wt 157.0 lb

## 2011-06-11 DIAGNOSIS — K5900 Constipation, unspecified: Secondary | ICD-10-CM

## 2011-06-11 DIAGNOSIS — R911 Solitary pulmonary nodule: Secondary | ICD-10-CM

## 2011-06-11 NOTE — Progress Notes (Signed)
  Subjective:    Patient ID: Alexander Goodwin, male    DOB: 09-28-1920, 77 y.o.   MRN: [id number]  HPI Alexander Goodwin presents feeling weak and low on energy. Last week he was also constipated for which he took to PM doses of miralax followed the next day by fecal incontinence with several lesser episodes of fecal soiling. He was unaware of the first episode of stooling. Currently he is doing well - no continued bowel problems.  I have reviewed the patient's medical history in detail and updated the computerized patient record.    Review of Systems System review is negative for any constitutional, cardiac, pulmonary, GI or neuro symptoms or complaints other than as described in the HPI.     Objective:   Physical Exam Vitals noted Gen'l - WNWD elderly white man in no distress Cor - RRR Abd- BS +, no guarding or rebound, no masses, no tenderness       Assessment & Plan:  Constipation - patient with an overly vigorous response to miralax. Bowel habit now normal.  Plan - for future constipation recommend using MOM

## 2011-06-12 NOTE — Assessment & Plan Note (Signed)
For routine follow-up CXR

## 2011-06-13 ENCOUNTER — Ambulatory Visit (INDEPENDENT_AMBULATORY_CARE_PROVIDER_SITE_OTHER)
Admission: RE | Admit: 2011-06-13 | Discharge: 2011-06-13 | Disposition: A | Discharge status: Home / Self Care | Payer: Medicare Other | Source: Ambulatory Visit | Attending: Internal Medicine | Admitting: Internal Medicine

## 2011-06-13 DIAGNOSIS — R911 Solitary pulmonary nodule: Secondary | ICD-10-CM

## 2011-06-14 ENCOUNTER — Encounter: Payer: Self-pay | Admitting: Internal Medicine

## 2011-07-10 ENCOUNTER — Telehealth: Payer: Self-pay | Admitting: *Deleted

## 2011-07-10 NOTE — Telephone Encounter (Signed)
Ok to refill tramadol x 5

## 2011-07-10 NOTE — Telephone Encounter (Signed)
Pt is requesting refill on tramadol.  

## 2011-07-11 NOTE — Telephone Encounter (Signed)
Called pt to confirm that Tramadol is the medication he is needing to be refilled. I do not see this medication on pts list and the pharmacy does not have medication on file either. Called pt to get confirmation on this waiting on call back

## 2011-07-24 NOTE — Telephone Encounter (Signed)
I have called and left several message for pt to return my call to verify Tramadol prescription. I do not see where pt takes in centricity or epic. I will waiting for pt to call me back to confirm

## 2011-08-27 DIAGNOSIS — H4011X0 Primary open-angle glaucoma, stage unspecified: Secondary | ICD-10-CM | POA: Diagnosis not present

## 2011-09-10 DIAGNOSIS — R3915 Urgency of urination: Secondary | ICD-10-CM | POA: Diagnosis not present

## 2011-09-10 DIAGNOSIS — R351 Nocturia: Secondary | ICD-10-CM | POA: Diagnosis not present

## 2011-10-05 DIAGNOSIS — L570 Actinic keratosis: Secondary | ICD-10-CM | POA: Diagnosis not present

## 2011-10-05 DIAGNOSIS — D485 Neoplasm of uncertain behavior of skin: Secondary | ICD-10-CM | POA: Diagnosis not present

## 2011-10-05 DIAGNOSIS — L0100 Impetigo, unspecified: Secondary | ICD-10-CM | POA: Diagnosis not present

## 2011-10-08 ENCOUNTER — Telehealth: Payer: Self-pay | Admitting: Internal Medicine

## 2011-10-08 NOTE — Telephone Encounter (Signed)
Patient requesting to transfer out of the practice to Catha Gosselin, MD.  Will send ROI to new address [address], [address], [address] [place], [place], [place] [address].

## 2011-11-02 DIAGNOSIS — L259 Unspecified contact dermatitis, unspecified cause: Secondary | ICD-10-CM | POA: Diagnosis not present

## 2011-11-02 DIAGNOSIS — L820 Inflamed seborrheic keratosis: Secondary | ICD-10-CM | POA: Diagnosis not present

## 2011-11-06 DIAGNOSIS — Z961 Presence of intraocular lens: Secondary | ICD-10-CM | POA: Diagnosis not present

## 2011-11-06 DIAGNOSIS — H409 Unspecified glaucoma: Secondary | ICD-10-CM | POA: Diagnosis not present

## 2011-11-06 DIAGNOSIS — H4011X0 Primary open-angle glaucoma, stage unspecified: Secondary | ICD-10-CM | POA: Diagnosis not present

## 2011-11-09 DIAGNOSIS — G459 Transient cerebral ischemic attack, unspecified: Secondary | ICD-10-CM | POA: Diagnosis not present

## 2011-11-09 DIAGNOSIS — E785 Hyperlipidemia, unspecified: Secondary | ICD-10-CM | POA: Diagnosis not present

## 2011-11-09 DIAGNOSIS — E0590 Thyrotoxicosis, unspecified without thyrotoxic crisis or storm: Secondary | ICD-10-CM | POA: Diagnosis not present

## 2011-11-09 DIAGNOSIS — Z86010 Personal history of colonic polyps: Secondary | ICD-10-CM | POA: Diagnosis not present

## 2011-11-09 DIAGNOSIS — I10 Essential (primary) hypertension: Secondary | ICD-10-CM | POA: Diagnosis not present

## 2011-11-09 DIAGNOSIS — H409 Unspecified glaucoma: Secondary | ICD-10-CM | POA: Diagnosis not present

## 2011-11-09 DIAGNOSIS — D649 Anemia, unspecified: Secondary | ICD-10-CM | POA: Diagnosis not present

## 2011-12-06 DIAGNOSIS — H401190 Primary open-angle glaucoma, unspecified eye, stage unspecified: Secondary | ICD-10-CM | POA: Insufficient documentation

## 2011-12-06 DIAGNOSIS — H4011X0 Primary open-angle glaucoma, stage unspecified: Secondary | ICD-10-CM | POA: Diagnosis not present

## 2011-12-06 DIAGNOSIS — H409 Unspecified glaucoma: Secondary | ICD-10-CM | POA: Diagnosis not present

## 2011-12-11 DIAGNOSIS — D235 Other benign neoplasm of skin of trunk: Secondary | ICD-10-CM | POA: Diagnosis not present

## 2011-12-11 DIAGNOSIS — L820 Inflamed seborrheic keratosis: Secondary | ICD-10-CM | POA: Diagnosis not present

## 2011-12-11 DIAGNOSIS — L570 Actinic keratosis: Secondary | ICD-10-CM | POA: Diagnosis not present

## 2012-01-31 DIAGNOSIS — H4011X0 Primary open-angle glaucoma, stage unspecified: Secondary | ICD-10-CM | POA: Diagnosis not present

## 2012-01-31 DIAGNOSIS — H409 Unspecified glaucoma: Secondary | ICD-10-CM | POA: Diagnosis not present

## 2012-02-12 ENCOUNTER — Other Ambulatory Visit: Payer: Self-pay

## 2012-02-12 MED ORDER — DOXAZOSIN MESYLATE 8 MG PO TABS: ORAL_TABLET | ORAL | Status: DC

## 2012-03-10 ENCOUNTER — Other Ambulatory Visit: Payer: Self-pay | Admitting: Family Medicine

## 2012-03-10 DIAGNOSIS — Z79899 Other long term (current) drug therapy: Secondary | ICD-10-CM | POA: Diagnosis not present

## 2012-03-10 DIAGNOSIS — R042 Hemoptysis: Secondary | ICD-10-CM | POA: Diagnosis not present

## 2012-03-11 ENCOUNTER — Ambulatory Visit
Admission: RE | Admit: 2012-03-11 | Discharge: 2012-03-11 | Disposition: A | Discharge status: Home / Self Care | Payer: Medicare Other | Source: Ambulatory Visit | Attending: Family Medicine | Admitting: Family Medicine

## 2012-03-11 DIAGNOSIS — D71 Functional disorders of polymorphonuclear neutrophils: Secondary | ICD-10-CM | POA: Diagnosis not present

## 2012-03-11 DIAGNOSIS — R042 Hemoptysis: Secondary | ICD-10-CM | POA: Diagnosis not present

## 2012-03-11 DIAGNOSIS — R222 Localized swelling, mass and lump, trunk: Secondary | ICD-10-CM | POA: Diagnosis not present

## 2012-03-11 MED ORDER — IOHEXOL 300 MG/ML  SOLN
75.0000 mL | Freq: Once | INTRAMUSCULAR | Status: AC | PRN
  Administered 2012-03-11: 75 mL via INTRAVENOUS

## 2012-03-13 ENCOUNTER — Ambulatory Visit (INDEPENDENT_AMBULATORY_CARE_PROVIDER_SITE_OTHER): Payer: Medicare Other | Admitting: Critical Care Medicine

## 2012-03-13 ENCOUNTER — Encounter: Payer: Self-pay | Admitting: Critical Care Medicine

## 2012-03-13 VITALS — BP 110/70 | HR 64 | Temp 97.8°F | Ht 64.0 in | Wt 170.0 lb

## 2012-03-13 DIAGNOSIS — R222 Localized swelling, mass and lump, trunk: Secondary | ICD-10-CM

## 2012-03-13 DIAGNOSIS — R911 Solitary pulmonary nodule: Secondary | ICD-10-CM

## 2012-03-13 DIAGNOSIS — R918 Other nonspecific abnormal finding of lung field: Secondary | ICD-10-CM

## 2012-03-13 NOTE — Patient Instructions (Addendum)
We will set up a bronchoscopy for Aug 7th Wed at 9am. Arrive at [place] at [facility] Nothing by mouth the night before after midnight. No change in medications for now

## 2012-03-13 NOTE — Progress Notes (Signed)
Subjective:    Patient ID: Alexander Goodwin, male    DOB: 11/20/1920, 78 y.o.   MRN: [id number]  HPI 78 y.o. WM This patient is referred for evaluation of new onset right upper lobe masslike lesions in the setting of prior left upper lobe nodular density with negative workup in the past including bronchoscopy Left upper lobe lesion has regressed the right upper lobe lesions are noted  Pt noted onset of phlegm in throat and noted hemoptysis several weeks ago.  Pt notes hoarseness and cough at night for several months.  Cough is worse after eating. Pt denies dysphagia. Exsmoker.   No prior PNA.  No Copd Dx.   No TB exposure.  Weight is sl up.  Appt is ok No pain.  Pt notes poor energy.  Blood is mixed with mucus and mucus is gray. This patient because of the above symptom complex had radiographs performed which showed abnormalities and subsequent CT of the chest was performed showing right upper lobe lesions which are negative the patient is referred for further evaluation  Past Medical History  Diagnosis Date  . Diverticulitis of colon   . History of colon polyps   . Arthritis     right knee, hips, neck. He denies any major limitation in activities  . GERD (gastroesophageal reflux disease)     well controlled with medication. No nocturnal symptoms on a regular basis  . Glaucoma     both eyes, followed closely by Dr. Cashwell  . Esophagitis     Last EGD Fall '11   . Solitary lung nodule     LUL, stable over several years. s/p eval [facility]-pulmonary  . Pancreatitis     resolved after GB surgery  . Stroke     TIAs - age 85  . BPH (benign prostatic hypertrophy) with urinary obstruction   . Hyperlipemia   . Hypertension   . Seasonal allergies      Family History  Problem Relation Age of Onset  . Diabetes Mother   . Hypertension Mother   . Heart disease Mother   . Stroke Father   . Heart disease Father   . Diabetes Father   . Cancer Sister     uterine cancer  . Cancer Brother   .  COPD Brother   . Hypertension Brother   . Cancer Brother   . Cancer Sister     gyn malignancy  . Cancer Sister     pancreatic cancer  . Allergies Sister   . Allergies Son   . Rheum arthritis Father   . Asthma Sister   . Asthma Son      History   Social History  . Marital Status: Married    Spouse Name: N/A    Number of Children: 3  . Years of Education: HSG   Occupational History  . 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 Smoking status: Former Smoker -- 0.5 packs/day for 30 years    Types: Cigarettes    Quit date: 08/20/1978  . Smokeless tobacco: Former User    Quit date: 11/16/1990  . Alcohol Use: No  . Drug Use: No  . Sexually Active: Yes   Other Topics Concern  . Not on file   Social History Narrative   HSG.   Married 1948. 3 sons - '52, '57,'58- 5 grandchildren, one deceased at 21 - OD.  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 Marriage in good health. End of Life Care -DNR/DNI,  No prolonged heroic or futile measures.      No Known Allergies   Outpatient Prescriptions Prior to Visit  Medication Sig Dispense Refill  . acetaminophen (TYLENOL ARTHRITIS PAIN) 650 MG CR tablet Take 650 mg by mouth every 8 (eight) hours as needed.        . amLODipine (NORVASC) 10 MG tablet Take 10 mg by mouth daily.        . aspirin 81 MG tablet Take 81 mg by mouth daily.        . calcium-vitamin D (OSCAL 500/200 D-3) 500-200 MG-UNIT per tablet Take 1 tablet by mouth daily.        . dorzolamide-timolol (COSOPT) 22.3-6.8 MG/ML ophthalmic solution 1 drop 2 (two) times daily.        . doxazosin (CARDURA) 8 MG tablet TAKE 1 TABLET BY MOUTH EVERY DAY  30 tablet  6  . ketorolac (ACULAR) 0.4 % SOLN 1 drop 4 (four) times daily.        . Multiple Vitamins-Minerals (OCUVITE ADULT FORMULA PO) Take by mouth daily.        . Omeprazole Magnesium (PRILOSEC OTC PO) Take by mouth daily.        . furosemide (LASIX)  40 MG tablet TAKE 1 TABLET BY MOUTH ONCE DAILY  90 tablet  3  . atorvastatin (LIPITOR) 10 MG tablet Take 10 mg by mouth daily.             Review of Systems  Constitutional: Positive for fatigue. Negative for fever, chills, diaphoresis, activity change, appetite change and unexpected weight change.  HENT: Positive for hearing loss, congestion, rhinorrhea, dental problem, voice change and postnasal drip. Negative for ear pain, nosebleeds, sore throat, facial swelling, sneezing, mouth sores, trouble swallowing, neck pain, neck stiffness, sinus pressure, tinnitus and ear discharge.   Eyes: Positive for discharge and itching. Negative for photophobia and visual disturbance.  Respiratory: Positive for cough and shortness of breath. Negative for apnea, choking, chest tightness, wheezing and stridor.   Cardiovascular: Negative for chest pain, palpitations and leg swelling.  Gastrointestinal: Negative for nausea, vomiting, abdominal pain, constipation, blood in stool and abdominal distention.  Genitourinary: Positive for frequency. Negative for dysuria, urgency, hematuria, flank pain, decreased urine volume and difficulty urinating.  Musculoskeletal: Negative for myalgias, back pain, joint swelling, arthralgias and gait problem.  Skin: Negative for color change, pallor and rash.  Neurological: Negative for dizziness, tremors, seizures, syncope, speech difficulty, weakness, light-headedness, numbness and headaches.  Hematological: Negative for adenopathy. Bruises/bleeds easily.  Psychiatric/Behavioral: Negative for confusion, disturbed wake/sleep cycle and agitation. The patient is nervous/anxious.        Objective:   Physical Exam Filed Vitals:   03/13/12 1523  BP: 110/70  Pulse: 64  Temp: 97.8 F (36.6 C)  TempSrc: Oral  Height: 5' 4" (1.626 m)  Weight: 170 lb (77.111 kg)  SpO2: 96%    Gen: Pleasant, well-nourished, elderly male  in no distress,  normal affect  ENT: No lesions,  mouth  clear,  oropharynx clear, no postnasal drip  Neck: No JVD, no TMG, no carotid bruits  Lungs: No use of accessory muscles, no dullness to percussion, clear without rales or rhonchi  Cardiovascular: RRR, heart sounds normal, no murmur or gallops, no peripheral edema  Abdomen: soft and NT, no HSM,  BS normal  Musculoskeletal: No deformities, no cyanosis or clubbing  Neuro: alert, non focal  Skin:   Warm, no lesions or rashes   CT chest 03/10/2012 IMPRESSION:  1. New 3.0 x 2.3 cm mass in the right apex with spiculated margins  and pleural retraction. In addition, there is a new mass-like  lesion in the inferior right upper lobe with areas of internal  cavitation. Both of these lesions are nonspecific, but are  concerning. Differential considerations would include both  infection (particularly given the prior history of a large cavitary  lesion in the left apex demonstrated on prior chest CT 04/08/2001)  or neoplasm for both lesions.        Assessment & Plan:   Lung mass New right upper lobe mass 3 x 2.3 cm with spiculated margins and pleural retraction and also new masslike lesion in inferior right upper lobe with internal cavitation prior left upper lobe lesion had been previously biopsied and was nondiagnostic has regressed Above findings are worrisome for malignancy Plan Pursue bronchoscopy PET scan at a later date  Solitary lung nodule Left upper lobe nodular lesion has a bit regressed over the past several years has been previously evaluated and was nondiagnostic on bronchoscopy Please review the lung mass assessment   Updated Medication List Outpatient Encounter Prescriptions as of 03/13/2012  Medication Sig Dispense Refill  . acetaminophen (TYLENOL ARTHRITIS PAIN) 650 MG CR tablet Take 650 mg by mouth every 8 (eight) hours as needed.        . amLODipine (NORVASC) 10 MG tablet Take 10 mg by mouth daily.        . aspirin 81 MG tablet Take 81 mg by mouth daily.          . calcium-vitamin D (OSCAL 500/200 D-3) 500-200 MG-UNIT per tablet Take 1 tablet by mouth daily.        . dorzolamide-timolol (COSOPT) 22.3-6.8 MG/ML ophthalmic solution 1 drop 2 (two) times daily.        . doxazosin (CARDURA) 8 MG tablet TAKE 1 TABLET BY MOUTH EVERY DAY  30 tablet  6  . furosemide (LASIX) 40 MG tablet       . ketorolac (ACULAR) 0.4 % SOLN 1 drop 4 (four) times daily.        . Multiple Vitamins-Minerals (OCUVITE ADULT FORMULA PO) Take by mouth daily.        . Omeprazole Magnesium (PRILOSEC OTC PO) Take by mouth daily.        . DISCONTD: furosemide (LASIX) 40 MG tablet TAKE 1 TABLET BY MOUTH ONCE DAILY  90 tablet  3  . DISCONTD: atorvastatin (LIPITOR) 10 MG tablet Take 10 mg by mouth daily.            

## 2012-03-14 DIAGNOSIS — J852 Abscess of lung without pneumonia: Secondary | ICD-10-CM | POA: Insufficient documentation

## 2012-03-14 NOTE — Assessment & Plan Note (Signed)
Left upper lobe nodular lesion has a bit regressed over the past several years has been previously evaluated and was nondiagnostic on bronchoscopy Please review the lung mass assessment

## 2012-03-14 NOTE — Assessment & Plan Note (Signed)
New right upper lobe mass 3 x 2.3 cm with spiculated margins and pleural retraction and also new masslike lesion in inferior right upper lobe with internal cavitation prior left upper lobe lesion had been previously biopsied and was nondiagnostic has regressed Above findings are worrisome for malignancy Plan Pursue bronchoscopy PET scan at a later date

## 2012-03-26 ENCOUNTER — Ambulatory Visit (HOSPITAL_COMMUNITY): Payer: Medicare Other

## 2012-03-26 ENCOUNTER — Ambulatory Visit (HOSPITAL_COMMUNITY)
Admission: RE | Admit: 2012-03-26 | Discharge: 2012-03-26 | Disposition: A | Discharge status: Home / Self Care | Payer: Medicare Other | Source: Ambulatory Visit | Attending: Critical Care Medicine | Admitting: Critical Care Medicine

## 2012-03-26 ENCOUNTER — Encounter (HOSPITAL_COMMUNITY)
Admission: RE | Disposition: A | Discharge status: Home / Self Care | Payer: Self-pay | Source: Ambulatory Visit | Attending: Critical Care Medicine

## 2012-03-26 ENCOUNTER — Encounter (HOSPITAL_COMMUNITY): Payer: Self-pay

## 2012-03-26 ENCOUNTER — Telehealth: Payer: Self-pay | Admitting: Critical Care Medicine

## 2012-03-26 DIAGNOSIS — E785 Hyperlipidemia, unspecified: Secondary | ICD-10-CM | POA: Diagnosis not present

## 2012-03-26 DIAGNOSIS — K219 Gastro-esophageal reflux disease without esophagitis: Secondary | ICD-10-CM | POA: Diagnosis not present

## 2012-03-26 DIAGNOSIS — R222 Localized swelling, mass and lump, trunk: Secondary | ICD-10-CM | POA: Insufficient documentation

## 2012-03-26 DIAGNOSIS — J690 Pneumonitis due to inhalation of food and vomit: Secondary | ICD-10-CM

## 2012-03-26 DIAGNOSIS — Z79899 Other long term (current) drug therapy: Secondary | ICD-10-CM | POA: Diagnosis not present

## 2012-03-26 DIAGNOSIS — R918 Other nonspecific abnormal finding of lung field: Secondary | ICD-10-CM

## 2012-03-26 DIAGNOSIS — R1310 Dysphagia, unspecified: Secondary | ICD-10-CM

## 2012-03-26 DIAGNOSIS — I10 Essential (primary) hypertension: Secondary | ICD-10-CM | POA: Diagnosis not present

## 2012-03-26 DIAGNOSIS — J984 Other disorders of lung: Secondary | ICD-10-CM | POA: Diagnosis not present

## 2012-03-26 HISTORY — PX: VIDEO BRONCHOSCOPY: SHX5072

## 2012-03-26 SURGERY — BRONCHOSCOPY, WITH FLUOROSCOPY
Anesthesia: Moderate Sedation | Laterality: Bilateral

## 2012-03-26 MED ORDER — CLINDAMYCIN HCL 300 MG PO CAPS: 300.0000 mg | ORAL_CAPSULE | Freq: Three times a day (TID) | ORAL | Status: AC

## 2012-03-26 MED ORDER — PHENYLEPHRINE HCL 0.25 % NA SOLN
NASAL | Status: DC | PRN
  Administered 2012-03-26: 2 via NASAL

## 2012-03-26 MED ORDER — ALBUTEROL SULFATE (5 MG/ML) 0.5% IN NEBU
2.5000 mg | INHALATION_SOLUTION | Freq: Once | RESPIRATORY_TRACT | Status: AC
  Administered 2012-03-26: 2.5 mg via RESPIRATORY_TRACT

## 2012-03-26 MED ORDER — MIDAZOLAM HCL 10 MG/2ML IJ SOLN
INTRAMUSCULAR | Status: DC | PRN
  Administered 2012-03-26: 3 mg via INTRAVENOUS

## 2012-03-26 MED ORDER — LIDOCAINE HCL 2 % EX GEL
CUTANEOUS | Status: DC | PRN
  Administered 2012-03-26: 1

## 2012-03-26 MED ORDER — SODIUM CHLORIDE 0.9 % IV SOLN
INTRAVENOUS | Status: DC
  Administered 2012-03-26: 10 mL/h via INTRAVENOUS

## 2012-03-26 MED ORDER — MIDAZOLAM HCL 10 MG/2ML IJ SOLN
INTRAMUSCULAR | Status: AC
  Filled 2012-03-26: qty 4

## 2012-03-26 MED ORDER — FENTANYL CITRATE 0.05 MG/ML IJ SOLN
INTRAMUSCULAR | Status: DC | PRN
  Administered 2012-03-26: 50 ug via INTRAVENOUS

## 2012-03-26 MED ORDER — LIDOCAINE HCL (PF) 1 % IJ SOLN
INTRAMUSCULAR | Status: DC | PRN
  Administered 2012-03-26: 6 mL

## 2012-03-26 MED ORDER — FENTANYL CITRATE 0.05 MG/ML IJ SOLN
INTRAMUSCULAR | Status: AC
  Filled 2012-03-26: qty 4

## 2012-03-26 NOTE — Interval H&P Note (Signed)
Pt seen and examined and there are no interval changes in hx or physical exam.  The pt is ready for FOB.  Shan Levans Beeper  [phone]  Cell  [phone]  If no response or cell goes to voicemail, call beeper [phone]

## 2012-03-26 NOTE — Telephone Encounter (Signed)
Called, spoke with pt.  Informed him cleocin rx sent to pharm by Dr. Delford Field.  He verbalized understanding of this.  We have scheduled him to see Dr. Delford Field on Aug 22 at 4:30 pm in [place] -- pt aware.  I spoke with Bjorn Loser about the orders.  States this dept is already closed.  Will follow up on orders tomorrow.

## 2012-03-26 NOTE — Progress Notes (Signed)
I agree with above Alexander Goodwin  

## 2012-03-26 NOTE — H&P (View-Only) (Signed)
Subjective:    Patient ID: Alexander Goodwin, male    DOB: 1920/12/19, 78 y.o.   MRN: [id number]  HPI 78 y.o. WM This patient is referred for evaluation of new onset right upper lobe masslike lesions in the setting of prior left upper lobe nodular density with negative workup in the past including bronchoscopy Left upper lobe lesion has regressed the right upper lobe lesions are noted  Pt noted onset of phlegm in throat and noted hemoptysis several weeks ago.  Pt notes hoarseness and cough at night for several months.  Cough is worse after eating. Pt denies dysphagia. Exsmoker.   No prior PNA.  No Copd Dx.   No TB exposure.  Weight is sl up.  Appt is ok No pain.  Pt notes poor energy.  Blood is mixed with mucus and mucus is gray. This patient because of the above symptom complex had radiographs performed which showed abnormalities and subsequent CT of the chest was performed showing right upper lobe lesions which are negative the patient is referred for further evaluation  Past Medical History  Diagnosis Date  . Diverticulitis of colon   . History of colon polyps   . Arthritis     right knee, hips, neck. He denies any major limitation in activities  . GERD (gastroesophageal reflux disease)     well controlled with medication. No nocturnal symptoms on a regular basis  . Glaucoma     both eyes, followed closely by Dr. Eulah Pont  . Esophagitis     Last EGD Fall '11   . Solitary lung nodule     LUL, stable over several years. s/p eval [facility]-pulmonary  . Pancreatitis     resolved after GB surgery  . Stroke     TIAs - age 46  . BPH (benign prostatic hypertrophy) with urinary obstruction   . Hyperlipemia   . Hypertension   . Seasonal allergies      Family History  Problem Relation Age of Onset  . Diabetes Mother   . Hypertension Mother   . Heart disease Mother   . Stroke Father   . Heart disease Father   . Diabetes Father   . Cancer Sister     uterine cancer  . Cancer Brother   .  COPD Brother   . Hypertension Brother   . Cancer Brother   . Cancer Sister     gyn malignancy  . Cancer Sister     pancreatic cancer  . Allergies Sister   . Allergies Son   . Rheum arthritis Father   . Asthma Sister   . Asthma Son      History   Social History  . Marital Status: Married    Spouse Name: N/A    Number of Children: 3  . Years of Education: HSG   Occupational History  . 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 Smoking status: Former Smoker -- 0.5 packs/day for 30 years    Types: Cigarettes    Quit date: 08/20/1978  . Smokeless tobacco: Former Neurosurgeon    Quit date: 11/16/1990  . Alcohol Use: No  . Drug Use: No  . Sexually Active: Yes   Other Topics Concern  . Not on file   Social History Narrative   HSG.   Married 1948. 3 sons - '52, '57,'58- 5 grandchildren, one deceased at 15 - OD.  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 Marriage in good health. End of Life Care -DNR/DNI,  No prolonged heroic or futile measures.      No Known Allergies   Outpatient Prescriptions Prior to Visit  Medication Sig Dispense Refill  . acetaminophen (TYLENOL ARTHRITIS PAIN) 650 MG CR tablet Take 650 mg by mouth every 8 (eight) hours as needed.        Marland Kitchen amLODipine (NORVASC) 10 MG tablet Take 10 mg by mouth daily.        Marland Kitchen aspirin 81 MG tablet Take 81 mg by mouth daily.        . calcium-vitamin D (OSCAL 500/200 D-3) 500-200 MG-UNIT per tablet Take 1 tablet by mouth daily.        . dorzolamide-timolol (COSOPT) 22.3-6.8 MG/ML ophthalmic solution 1 drop 2 (two) times daily.        Marland Kitchen doxazosin (CARDURA) 8 MG tablet TAKE 1 TABLET BY MOUTH EVERY DAY  30 tablet  6  . ketorolac (ACULAR) 0.4 % SOLN 1 drop 4 (four) times daily.        . Multiple Vitamins-Minerals (OCUVITE ADULT FORMULA PO) Take by mouth daily.        . Omeprazole Magnesium (PRILOSEC OTC PO) Take by mouth daily.        . furosemide (LASIX)  40 MG tablet TAKE 1 TABLET BY MOUTH ONCE DAILY  90 tablet  3  . atorvastatin (LIPITOR) 10 MG tablet Take 10 mg by mouth daily.             Review of Systems  Constitutional: Positive for fatigue. Negative for fever, chills, diaphoresis, activity change, appetite change and unexpected weight change.  HENT: Positive for hearing loss, congestion, rhinorrhea, dental problem, voice change and postnasal drip. Negative for ear pain, nosebleeds, sore throat, facial swelling, sneezing, mouth sores, trouble swallowing, neck pain, neck stiffness, sinus pressure, tinnitus and ear discharge.   Eyes: Positive for discharge and itching. Negative for photophobia and visual disturbance.  Respiratory: Positive for cough and shortness of breath. Negative for apnea, choking, chest tightness, wheezing and stridor.   Cardiovascular: Negative for chest pain, palpitations and leg swelling.  Gastrointestinal: Negative for nausea, vomiting, abdominal pain, constipation, blood in stool and abdominal distention.  Genitourinary: Positive for frequency. Negative for dysuria, urgency, hematuria, flank pain, decreased urine volume and difficulty urinating.  Musculoskeletal: Negative for myalgias, back pain, joint swelling, arthralgias and gait problem.  Skin: Negative for color change, pallor and rash.  Neurological: Negative for dizziness, tremors, seizures, syncope, speech difficulty, weakness, light-headedness, numbness and headaches.  Hematological: Negative for adenopathy. Bruises/bleeds easily.  Psychiatric/Behavioral: Negative for confusion, disturbed wake/sleep cycle and agitation. The patient is nervous/anxious.        Objective:   Physical Exam Filed Vitals:   03/13/12 1523  BP: 110/70  Pulse: 64  Temp: 97.8 F (36.6 C)  TempSrc: Oral  Height: 5\' 4"  (1.626 m)  Weight: 170 lb (77.111 kg)  SpO2: 96%    Gen: Pleasant, well-nourished, elderly male  in no distress,  normal affect  ENT: No lesions,  mouth  clear,  oropharynx clear, no postnasal drip  Neck: No JVD, no TMG, no carotid bruits  Lungs: No use of accessory muscles, no dullness to percussion, clear without rales or rhonchi  Cardiovascular: RRR, heart sounds normal, no murmur or gallops, no peripheral edema  Abdomen: soft and NT, no HSM,  BS normal  Musculoskeletal: No deformities, no cyanosis or clubbing  Neuro: alert, non focal  Skin:  Warm, no lesions or rashes   CT chest 03/10/2012 IMPRESSION:  1. New 3.0 x 2.3 cm mass in the right apex with spiculated margins  and pleural retraction. In addition, there is a new mass-like  lesion in the inferior right upper lobe with areas of internal  cavitation. Both of these lesions are nonspecific, but are  concerning. Differential considerations would include both  infection (particularly given the prior history of a large cavitary  lesion in the left apex demonstrated on prior chest CT 04/08/2001)  or neoplasm for both lesions.        Assessment & Plan:   Lung mass New right upper lobe mass 3 x 2.3 cm with spiculated margins and pleural retraction and also new masslike lesion in inferior right upper lobe with internal cavitation prior left upper lobe lesion had been previously biopsied and was nondiagnostic has regressed Above findings are worrisome for malignancy Plan Pursue bronchoscopy PET scan at a later date  Solitary lung nodule Left upper lobe nodular lesion has a bit regressed over the past several years has been previously evaluated and was nondiagnostic on bronchoscopy Please review the lung mass assessment   Updated Medication List Outpatient Encounter Prescriptions as of 03/13/2012  Medication Sig Dispense Refill  . acetaminophen (TYLENOL ARTHRITIS PAIN) 650 MG CR tablet Take 650 mg by mouth every 8 (eight) hours as needed.        Marland Kitchen amLODipine (NORVASC) 10 MG tablet Take 10 mg by mouth daily.        Marland Kitchen aspirin 81 MG tablet Take 81 mg by mouth daily.          . calcium-vitamin D (OSCAL 500/200 D-3) 500-200 MG-UNIT per tablet Take 1 tablet by mouth daily.        . dorzolamide-timolol (COSOPT) 22.3-6.8 MG/ML ophthalmic solution 1 drop 2 (two) times daily.        Marland Kitchen doxazosin (CARDURA) 8 MG tablet TAKE 1 TABLET BY MOUTH EVERY DAY  30 tablet  6  . furosemide (LASIX) 40 MG tablet       . ketorolac (ACULAR) 0.4 % SOLN 1 drop 4 (four) times daily.        . Multiple Vitamins-Minerals (OCUVITE ADULT FORMULA PO) Take by mouth daily.        . Omeprazole Magnesium (PRILOSEC OTC PO) Take by mouth daily.        Marland Kitchen DISCONTD: furosemide (LASIX) 40 MG tablet TAKE 1 TABLET BY MOUTH ONCE DAILY  90 tablet  3  . DISCONTD: atorvastatin (LIPITOR) 10 MG tablet Take 10 mg by mouth daily.

## 2012-03-26 NOTE — Op Note (Signed)
Bronchoscopy Procedure Note  Date of Operation: 03/26/2012  Pre-op Diagnosis: RUL cavitary lung mass   Post-op Diagnosis: same  Surgeon: Shan Levans  Anesthesia: Monitored Local Anesthesia with Sedation, Versed 3mg  IV,  Fentanyl IV  Operation: Flexible fiberoptic bronchoscopy, diagnostic   Findings: No endobronchial lesions. Copious thick secretions from RUL removed.  Specimen: TBBx, Bronch washings RUL  Estimated Blood Loss: less than 50   Complications: None  Indications and History: The patient is a 78 y.o. male with RUL cavitary lung mass.  The risks, benefits, complications, treatment options and expected outcomes were discussed with the patient.  The possibilities of reaction to medication, pulmonary aspiration, perforation of a viscus, bleeding, failure to diagnose a condition and creating a complication requiring transfusion or operation were discussed with the patient who freely signed the consent.    Description of Procedure: The patient was re-examined in the bronchoscopy suite and the site of surgery properly noted/marked.  The patient was identified as Alexander Goodwin and the procedure verified as Flexible Fiberoptic Bronchoscopy.  A Time Out was held and the above information confirmed.   After the induction of topical nasopharyngeal anesthesia, the patient was positioned  and the bronchoscope was passed through the R  nares. The vocal cords were visualized and  1% buffered lidocaine 5 ml was topically placed onto the cords. The cords were normal. The scope was then passed into the trachea.  1% buffered lidocaine 5 ml was used topically on the carina.  Careful inspection of the tracheal lumen was accomplished. The scope was sequentially passed into the left main and then left upper and lower bronchi and segmental bronchi.    The scope was then withdrawn and advanced into the right main bronchus and then into the RUL, RML, and RLL bronchi and segmental bronchi.   TBBx  and Bronchial washings were  done and there was two  specimen.   Endobronchial findings: none Trachea: Normal mucosa Carina: Normal mucosa Right main bronchus: Normal mucosa Right upper lobe bronchus: Edematous mucosa with thick tenaceous mucus Right middle  lobe bronchus: Normal mucosa Right lower  lobe bronchus: Normal mucosa Left main bronchus: Normal mucosa Left upper lobe bronchus: Normal mucosa Left lower lobe bronchus: Normal mucosa  The Patient was taken to the Endoscopy Recovery area in satisfactory condition.  Attestation: I performed the procedure.  Shan Levans

## 2012-03-26 NOTE — Telephone Encounter (Signed)
crystal  This pt will need a swallow study : modified barium swallow and standard barium swallow with speech path at [facility] done I put orders in but ? If correc  Dx is aspiration pneumonia and dysphagia  He needs Rov in two weeks at [place]  Call him to tell him I sent Rx for cleocin to his pharmacy

## 2012-03-26 NOTE — Progress Notes (Signed)
Video bronchoscopy procedure performed, Biopsy intervention performed. Bronchial washing intervention performed.

## 2012-03-27 ENCOUNTER — Encounter (HOSPITAL_COMMUNITY): Payer: Self-pay | Admitting: Critical Care Medicine

## 2012-03-27 ENCOUNTER — Encounter (HOSPITAL_COMMUNITY): Payer: Self-pay

## 2012-03-27 NOTE — Telephone Encounter (Signed)
Pt set up for mbs @[facility]  04/03/12@1 :00pm with barium swollow to follow@1 :30pm pt is aware of these appts Tobe Sos

## 2012-03-27 NOTE — Telephone Encounter (Signed)
Spoke with Almyra Free. Advised Dr. Delford Field does want pt to have BOTH MBS and standard barrium swallow at [facility].  She will schedule these and make sure orders are in correctly while scheduling.  Will forward msg to her to follow up on.  Libby, pls advise.  Thank you.  Note:  Pt is aware he will be receiving call to schedule these tests.

## 2012-03-28 LAB — CULTURE, RESPIRATORY W GRAM STAIN: Special Requests: NORMAL

## 2012-04-01 LAB — LEGIONELLA CULTURE

## 2012-04-03 ENCOUNTER — Ambulatory Visit (HOSPITAL_COMMUNITY)
Admit: 2012-04-03 | Discharge: 2012-04-03 | Disposition: A | Discharge status: Home / Self Care | Payer: Medicare Other | Attending: Critical Care Medicine | Admitting: Critical Care Medicine

## 2012-04-03 ENCOUNTER — Ambulatory Visit (HOSPITAL_COMMUNITY)
Admission: RE | Admit: 2012-04-03 | Discharge: 2012-04-03 | Disposition: A | Discharge status: Home / Self Care | Payer: Medicare Other | Source: Ambulatory Visit | Attending: Critical Care Medicine | Admitting: Critical Care Medicine

## 2012-04-03 DIAGNOSIS — J690 Pneumonitis due to inhalation of food and vomit: Secondary | ICD-10-CM

## 2012-04-03 DIAGNOSIS — R222 Localized swelling, mass and lump, trunk: Secondary | ICD-10-CM | POA: Diagnosis not present

## 2012-04-03 DIAGNOSIS — K449 Diaphragmatic hernia without obstruction or gangrene: Secondary | ICD-10-CM | POA: Diagnosis not present

## 2012-04-03 DIAGNOSIS — R1310 Dysphagia, unspecified: Secondary | ICD-10-CM | POA: Insufficient documentation

## 2012-04-03 DIAGNOSIS — Z79899 Other long term (current) drug therapy: Secondary | ICD-10-CM | POA: Diagnosis not present

## 2012-04-03 DIAGNOSIS — K219 Gastro-esophageal reflux disease without esophagitis: Secondary | ICD-10-CM | POA: Diagnosis not present

## 2012-04-03 DIAGNOSIS — I10 Essential (primary) hypertension: Secondary | ICD-10-CM | POA: Diagnosis not present

## 2012-04-03 DIAGNOSIS — E785 Hyperlipidemia, unspecified: Secondary | ICD-10-CM | POA: Diagnosis not present

## 2012-04-03 NOTE — Procedures (Addendum)
Objective Swallowing Evaluation:    Patient Details  Name: Alexander Goodwin MRN: [id number] Date of Birth: 1921/05/12  Today's Date: 04/03/2012 Time: 4098-1191 SLP Time Calculation (min): 49 min  Past Medical History:  Past Medical History  Diagnosis Date  . Diverticulitis of colon   . History of colon polyps   . Arthritis     right knee, hips, neck. He denies any major limitation in activities  . GERD (gastroesophageal reflux disease)     well controlled with medication. No nocturnal symptoms on a regular basis  . Glaucoma     both eyes, followed closely by Dr. Eulah Pont  . Esophagitis     Last EGD Fall '11   . Solitary lung nodule     LUL, stable over several years. s/p eval [facility]-pulmonary  . Pancreatitis     resolved after GB surgery  . Stroke     TIAs - age 37  . BPH (benign prostatic hypertrophy) with urinary obstruction   . Hyperlipemia   . Hypertension   . Seasonal allergies    Past Surgical History:  Past Surgical History  Procedure Date  . Cholecystectomy     '88 - laparotomy ([place])  . Appendectomy     '88 along with GB  . Bronchoscopy 1993  . Video bronchoscopy 03/26/2012    Procedure: VIDEO BRONCHOSCOPY WITH FLUORO;  Surgeon: Storm Frisk, MD;  Location: [facility];  Service: Cardiopulmonary;  Laterality: Bilateral;   HPI:  78 yo male referred by Dr Delford Field for MBS and esophagram due to concerns pt may be aspirating.  PMH + for diverticulosis of colon, colon polyps, GERD, glaucoma, lung nodule (stable), HTN, HLD, BPH, TIAs, pancreatitis, esophagitis Fall 2011, exsmoker.  Pt reports hoarseness and cough at night and after eating as well as sensation of phlegm in pharynx over the last six months.  He denies seasonal allergies.  Pt takes omeprazole but takes it after he eats-advised him to ask timing of PPI administration to maximize benefit.  Spouse states pt will wake in the middle of night coughing and take cough drop - advised to use caution with this given his  dysphagia.      Assessment / Plan / Recommendation Clinical Impression  Dysphagia Diagnosis: Mild oral phase dysphagia;Mild pharyngeal phase dysphagia;Mild cervical esophageal phase dysphagia Clinical impression: Pt presents with mild oropharyngeal dysphagia without aspiration/significant laryngeal penetration.  Mild oropharyngeal residuals (likely due to weakness) noted with all consistencies, but both cued and reflexive dry swallows aided clearance.  Premature spillage of oral residuals into pharynx with delayed reflexive swallow noted.  Advised pt/family to aspiration precautions to maximize airway protection especially given pt's weakness, lack of sensation to pharyngeal stasis and pulmonary issues.       Treatment Recommendation       Diet Recommendation Regular;Dysphagia 3 (Mechanical Soft);Thin liquid (avoid mixed consistencies)   Liquid Administration via: Straw;Cup Medication Administration: Whole meds with puree Supervision: Staff feed patient Compensations: Small sips/bites;Slow rate Postural Changes and/or Swallow Maneuvers: Out of bed for meals;Seated upright 90 degrees;Upright 30-60 min after meal    Other  Recommendations Oral Care Recommendations: Oral care BID   Follow Up Recommendations       Frequency and Duration        Pertinent Vitals/Pain Afebrile, decreased    SLP Swallow Goals     General HPI: 78 yo male referred by Dr Delford Field for MBS and esophagram due to concerns pt may be aspirating.  PMH + for diverticulosis of colon,  colon polyps, GERD, glaucoma, lung nodule (stable), HTN, HLD, BPH, TIAs, pancreatitis, esophagitis Fall 2011, exsmoker.  Pt reports hoarseness and cough at night and after eating as well as sensation of phlegm in pharynx over the last six months.  He denies seasonal allergies.  Pt takes omeprazole but takes it after he eats-advised him to ask timing of PPI administration to maximize benefit.  Spouse states pt will wake in the middle of night  coughing and take cough drop - advised to use caution with this given his dysphagia.  Previous Swallow Assessment: EGD Fall 2011 Diet Prior to this Study: Regular;Thin liquids Respiratory Status: Room air History of Recent Intubation: No Behavior/Cognition: Alert;Cooperative;Pleasant mood Oral Cavity - Dentition: Dentures, top;Adequate natural dentition Oral Motor / Sensory Function: Impaired motor Oral impairment: Left facial;Left labial (slight droop of eye, labial, facial: spouse states since TIA) Self-Feeding Abilities: Able to feed self Patient Positioning: Upright in chair Baseline Vocal Quality: Low vocal intensity Volitional Cough: Strong Volitional Swallow: Able to elicit Anatomy:  (appearance of osteophyte at C6-C7, pt denies pain in neck) Pharyngeal Secretions: Not observed secondary MBS    Reason for Referral     Oral Phase   Oral phase Impaired  Pharyngeal Phase Pharyngeal Phase: Impaired   Cervical Esophageal Phase     GO Functional Assessment Tool Used: mbs Functional Limitations: Swallowing Swallow Current Status ([id number]): At least 1 percent but less than 20 percent impaired, limited or restricted Swallow Goal Status [phone]): At least 1 percent but less than 20 percent impaired, limited or restricted Swallow Discharge Status [phone]): At least 1 percent but less than 20 percent impaired, limited or restricted     Cervical Esophageal Phase: Impaired    Chales Abrahams 04/03/2012, 4:23 PM

## 2012-04-07 ENCOUNTER — Institutional Professional Consult (permissible substitution): Payer: Medicare Other | Admitting: Internal Medicine

## 2012-04-07 ENCOUNTER — Telehealth: Payer: Self-pay | Admitting: *Deleted

## 2012-04-07 NOTE — Telephone Encounter (Signed)
Pt states he was given a shingle injection and was told by md that he could get it for $35.00. Received bill & he was charge $248.00. Gave pt billing # to call.Marland KitchenMarland KitchenMarland Kitchen8/19/13@2 :49pm/LMB

## 2012-04-10 ENCOUNTER — Ambulatory Visit (HOSPITAL_BASED_OUTPATIENT_CLINIC_OR_DEPARTMENT_OTHER)
Admission: RE | Admit: 2012-04-10 | Discharge: 2012-04-10 | Disposition: A | Discharge status: Home / Self Care | Payer: Medicare Other | Source: Ambulatory Visit | Attending: Critical Care Medicine | Admitting: Critical Care Medicine

## 2012-04-10 ENCOUNTER — Encounter: Payer: Self-pay | Admitting: Critical Care Medicine

## 2012-04-10 ENCOUNTER — Ambulatory Visit (INDEPENDENT_AMBULATORY_CARE_PROVIDER_SITE_OTHER): Payer: Medicare Other | Admitting: Critical Care Medicine

## 2012-04-10 VITALS — BP 118/70 | HR 60 | Temp 97.9°F | Ht 65.0 in | Wt 166.0 lb

## 2012-04-10 DIAGNOSIS — J852 Abscess of lung without pneumonia: Secondary | ICD-10-CM | POA: Diagnosis not present

## 2012-04-10 NOTE — Progress Notes (Signed)
Subjective:    Patient ID: Alexander Goodwin, male    DOB: Oct 06, 1920, 78 y.o.   MRN: [id number]  HPI  78 y.o. WM This patient is referred for evaluation of new onset right upper lobe masslike lesions in the setting of prior left upper lobe nodular density with negative workup in the past including bronchoscopy Left upper lobe lesion has regressed the right upper lobe lesions are noted  Pt noted onset of phlegm in throat and noted hemoptysis several weeks ago.  Pt notes hoarseness and cough at night for several months.  Cough is worse after eating. Pt denies dysphagia. Exsmoker.   No prior PNA.  No Copd Dx.   No TB exposure.  Weight is sl up.  Appt is ok No pain.  Pt notes poor energy.  Blood is mixed with mucus and mucus is gray. This patient because of the above symptom complex had radiographs performed which showed abnormalities and subsequent CT of the chest was performed showing right upper lobe lesions which are negative the patient is referred for further evaluation  04/10/2012 Cough is better.  No real chest pain.   Pt denies any significant sore throat, nasal congestion or excess secretions, fever, chills, sweats, unintended weight loss, pleurtic or exertional chest pain, orthopnea PND, or leg swelling Pt denies any increase in rescue therapy over baseline, denies waking up needing it or having any early am or nocturnal exacerbations of coughing/wheezing/or dyspnea. Pt also denies any obvious fluctuation in symptoms with  weather or environmental change or other alleviating or aggravating factors    Past Medical History  Diagnosis Date  . Diverticulitis of colon   . History of colon polyps   . Arthritis     right knee, hips, neck. He denies any major limitation in activities  . GERD (gastroesophageal reflux disease)     well controlled with medication. No nocturnal symptoms on a regular basis  . Glaucoma     both eyes, followed closely by Dr. Eulah Pont  . Esophagitis     Last EGD  Fall '11   . Solitary lung nodule     LUL, stable over several years. s/p eval LHC-pulmonary  . Pancreatitis     resolved after GB surgery  . Stroke     TIAs - age 82  . BPH (benign prostatic hypertrophy) with urinary obstruction   . Hyperlipemia   . Hypertension   . Seasonal allergies      Family History  Problem Relation Age of Onset  . Diabetes Mother   . Hypertension Mother   . Heart disease Mother   . Stroke Father   . Heart disease Father   . Diabetes Father   . Cancer Sister     uterine cancer  . Cancer Brother   . COPD Brother   . Hypertension Brother   . Cancer Brother   . Cancer Sister     gyn malignancy  . Cancer Sister     pancreatic cancer  . Allergies Sister   . Allergies Son   . Rheum arthritis Father   . Asthma Sister   . Asthma Son      History   Social History  . Marital Status: Married    Spouse Name: N/A    Number of Children: 3  . Years of Education: HSG   Occupational History  . 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 Smoking status: Former  Smoker -- 0.5 packs/day for 30 years    Types: Cigarettes    Quit date: 08/20/1978  . Smokeless tobacco: Former Neurosurgeon    Types: Chew    Quit date: 11/16/1990  . Alcohol Use: No  . Drug Use: No  . Sexually Active: Yes   Other Topics Concern  . Not on file   Social History Narrative   HSG.   Married 1948. 3 sons - '52, '57,'58- 5 grandchildren, one deceased at 67 - OD.  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 Marriage in good health. End of Life Care -DNR/DNI,  No prolonged heroic or futile measures.      Allergies  Allergen Reactions  . Clindamycin/Lincomycin     itching     Outpatient Prescriptions Prior to Visit  Medication Sig Dispense Refill  . acetaminophen (TYLENOL ARTHRITIS PAIN) 650 MG CR tablet Take 650 mg by mouth every 8 (eight) hours as needed.        Marland Kitchen amLODipine (NORVASC) 10 MG tablet Take  10 mg by mouth daily.        Marland Kitchen aspirin 81 MG tablet Take 81 mg by mouth daily.        . calcium-vitamin D (OSCAL 500/200 D-3) 500-200 MG-UNIT per tablet Take 1 tablet by mouth daily.        . dorzolamide-timolol (COSOPT) 22.3-6.8 MG/ML ophthalmic solution Place 1 drop into both eyes 2 (two) times daily.       Marland Kitchen doxazosin (CARDURA) 8 MG tablet TAKE 1 TABLET BY MOUTH EVERY DAY  30 tablet  6  . furosemide (LASIX) 40 MG tablet Take 20 mg by mouth as needed.       . Multiple Vitamins-Minerals (OCUVITE ADULT FORMULA PO) Take by mouth daily.        . Omeprazole Magnesium (PRILOSEC OTC PO) Take by mouth daily.        Marland Kitchen ketorolac (ACULAR) 0.4 % SOLN 1 drop.            Review of Systems  Constitutional: Positive for fatigue. Negative for fever, chills, diaphoresis, activity change, appetite change and unexpected weight change.  HENT: Positive for hearing loss, congestion, rhinorrhea, dental problem, voice change and postnasal drip. Negative for ear pain, nosebleeds, sore throat, facial swelling, sneezing, mouth sores, trouble swallowing, neck pain, neck stiffness, sinus pressure, tinnitus and ear discharge.   Eyes: Positive for discharge and itching. Negative for photophobia and visual disturbance.  Respiratory: Positive for cough and shortness of breath. Negative for apnea, choking, chest tightness, wheezing and stridor.   Cardiovascular: Negative for chest pain, palpitations and leg swelling.  Gastrointestinal: Negative for nausea, vomiting, abdominal pain, constipation, blood in stool and abdominal distention.  Genitourinary: Positive for frequency. Negative for dysuria, urgency, hematuria, flank pain, decreased urine volume and difficulty urinating.  Musculoskeletal: Negative for myalgias, back pain, joint swelling, arthralgias and gait problem.  Skin: Negative for color change, pallor and rash.  Neurological: Negative for dizziness, tremors, seizures, syncope, speech difficulty, weakness,  light-headedness, numbness and headaches.  Hematological: Negative for adenopathy. Bruises/bleeds easily.  Psychiatric/Behavioral: Negative for confusion, disturbed wake/sleep cycle and agitation. The patient is nervous/anxious.        Objective:   Physical Exam  Filed Vitals:   04/10/12 1638  BP: 118/70  Pulse: 60  Temp: 97.9 F (36.6 C)  TempSrc: Oral  Height: 5\' 5"  (1.651 m)  Weight: 75.297 kg (166 lb)  SpO2: 97%    Gen: Pleasant, well-nourished, elderly male  in no distress,  normal affect  ENT: No lesions,  mouth clear,  oropharynx clear, no postnasal drip  Neck: No JVD, no TMG, no carotid bruits  Lungs: No use of accessory muscles, no dullness to percussion, clear without rales or rhonchi  Cardiovascular: RRR, heart sounds normal, no murmur or gallops, no peripheral edema  Abdomen: soft and NT, no HSM,  BS normal  Musculoskeletal: No deformities, no cyanosis or clubbing  Neuro: alert, non focal  Skin: Warm, no lesions or rashes   CT chest 03/10/2012 IMPRESSION:  1. New 3.0 x 2.3 cm mass in the right apex with spiculated margins  and pleural retraction. In addition, there is a new mass-like  lesion in the inferior right upper lobe with areas of internal  cavitation. Both of these lesions are nonspecific, but are  concerning. Differential considerations would include both  infection (particularly given the prior history of a large cavitary  lesion in the left apex demonstrated on prior chest CT 04/08/2001)  or neoplasm for both lesions.   Dg Chest 2 View  04/10/2012  *RADIOLOGY REPORT*  Clinical Data: Lung abscess  CHEST - 2 VIEW  Comparison: March 26, 2012  Findings: Chest x-ray demonstrate a 4.4 x 3 cm lesion mass-like lesion in the inferior right upper lobe unchanged.  A second masslike lesion is identified in the medial right apex and is unchanged.  There are no new pulmonary lesion identified.  There is no pneumothorax.  The aorta is tortuous.  The heart  size is normal. The soft tissue and osseous structures are stable.  IMPRESSION: Two right-sided lung lesions presumably the patient's known lung abscess, not significantly changed compared to prior March 26, 2012.   Original Report Authenticated By: Sherian Rein, M.D.        Assessment & Plan:   Lung abscess Lung abscess RUL due to microaspiration with oropharyngeal dysphagia due to neck anatomy and motor weakness s/p prior TIAs Plan Augmentin x 10days (note allergy to cleocin) Pt has seen SLP and has reviewed dietary changes,  Also will keep HOB elevated 30degrees Return 2 months    Updated Medication List Outpatient Encounter Prescriptions as of 04/10/2012  Medication Sig Dispense Refill  . acetaminophen (TYLENOL ARTHRITIS PAIN) 650 MG CR tablet Take 650 mg by mouth every 8 (eight) hours as needed.        Marland Kitchen amLODipine (NORVASC) 10 MG tablet Take 10 mg by mouth daily.        Marland Kitchen aspirin 81 MG tablet Take 81 mg by mouth daily.        . brimonidine (ALPHAGAN P) 0.1 % SOLN Twice daily to left eye and three times daily to right eye      . calcium-vitamin D (OSCAL 500/200 D-3) 500-200 MG-UNIT per tablet Take 1 tablet by mouth daily.        . dorzolamide-timolol (COSOPT) 22.3-6.8 MG/ML ophthalmic solution Place 1 drop into both eyes 2 (two) times daily.       Marland Kitchen doxazosin (CARDURA) 8 MG tablet TAKE 1 TABLET BY MOUTH EVERY DAY  30 tablet  6  . furosemide (LASIX) 40 MG tablet Take 20 mg by mouth as needed.       . latanoprost (XALATAN) 0.005 % ophthalmic solution Place 1 drop into both eyes at bedtime.      . Multiple Vitamins-Minerals (OCUVITE ADULT FORMULA PO) Take by mouth daily.        . Omeprazole Magnesium (PRILOSEC OTC PO) Take by mouth daily.        Marland Kitchen  DISCONTD: ketorolac (ACULAR) 0.4 % SOLN 1 drop.

## 2012-04-10 NOTE — Patient Instructions (Addendum)
Chest xray today No other medication changes Return 2 months

## 2012-04-11 NOTE — Progress Notes (Signed)
Quick Note:  lmomtcb to inform pt of results. Also, where does pt want rx sent to. ______

## 2012-04-11 NOTE — Progress Notes (Signed)
Quick Note:  Notify the patient that the Xray is unchanged. We need to call in :  Augmentin 875mg  bid x 10days ______

## 2012-04-11 NOTE — Assessment & Plan Note (Signed)
Lung abscess RUL due to microaspiration with oropharyngeal dysphagia due to neck anatomy and motor weakness s/p prior TIAs Plan Augmentin x 10days (note allergy to cleocin) Pt has seen SLP and has reviewed dietary changes,  Also will keep HOB elevated 30degrees Return 2 months

## 2012-04-14 ENCOUNTER — Telehealth: Payer: Self-pay | Admitting: Critical Care Medicine

## 2012-04-14 MED ORDER — AMOXICILLIN-POT CLAVULANATE 875-125 MG PO TABS: 1.0000 | ORAL_TABLET | Freq: Two times a day (BID) | ORAL | Status: DC

## 2012-04-14 NOTE — Progress Notes (Signed)
Quick Note:  Called, spoke with pt's wife. She will have pt call back as he is not home right now. ______

## 2012-04-14 NOTE — Telephone Encounter (Signed)
Pt returning call about results as follows:  Notes Recorded by Storm Frisk, MD on 04/11/2012 at 12:08 PM Notify the patient that the Xray is unchanged. We need to call in :  Augmentin 875mg  bid x 10days  Pt states send rx to [facility]. Rx sent.Carron Curie, CMA

## 2012-04-16 ENCOUNTER — Telehealth: Payer: Self-pay | Admitting: Critical Care Medicine

## 2012-04-16 MED ORDER — LEVOFLOXACIN 500 MG PO TABS: ORAL_TABLET | ORAL | Status: DC

## 2012-04-16 NOTE — Telephone Encounter (Signed)
Dc augmentin Rx levaquin 500mg  /d x 5 days

## 2012-04-16 NOTE — Telephone Encounter (Signed)
Called and spoke patient and informed him of PW recs as listed below.  Discontinnued augmentin and Rx sent to verified pharmacy-- [facility]-- for Levaquin 500mg /d x5 days.  Patient verbalized understanding and nothing further needed at this time.

## 2012-04-16 NOTE — Telephone Encounter (Signed)
Spoke with patient-States he took his first dose of Augmentin yesterday-had diarrhea, nausea, and chills. He did take the RX with food. Pt has not taken his dose today as he would like recs from PW and if another abx is needed.

## 2012-04-25 LAB — FUNGUS CULTURE W SMEAR
Fungal Smear: NONE SEEN
Special Requests: NORMAL

## 2012-05-01 DIAGNOSIS — Z23 Encounter for immunization: Secondary | ICD-10-CM | POA: Diagnosis not present

## 2012-05-01 DIAGNOSIS — R197 Diarrhea, unspecified: Secondary | ICD-10-CM | POA: Diagnosis not present

## 2012-05-02 DIAGNOSIS — R197 Diarrhea, unspecified: Secondary | ICD-10-CM | POA: Diagnosis not present

## 2012-05-08 ENCOUNTER — Ambulatory Visit (HOSPITAL_BASED_OUTPATIENT_CLINIC_OR_DEPARTMENT_OTHER)
Admission: RE | Admit: 2012-05-08 | Discharge: 2012-05-08 | Disposition: A | Discharge status: Home / Self Care | Payer: Medicare Other | Source: Ambulatory Visit | Attending: Critical Care Medicine | Admitting: Critical Care Medicine

## 2012-05-08 ENCOUNTER — Encounter: Payer: Self-pay | Admitting: Critical Care Medicine

## 2012-05-08 ENCOUNTER — Other Ambulatory Visit: Payer: Self-pay | Admitting: *Deleted

## 2012-05-08 ENCOUNTER — Ambulatory Visit (INDEPENDENT_AMBULATORY_CARE_PROVIDER_SITE_OTHER): Payer: Medicare Other | Admitting: Critical Care Medicine

## 2012-05-08 VITALS — BP 132/76 | HR 57 | Temp 97.7°F | Ht 65.0 in | Wt 165.0 lb

## 2012-05-08 DIAGNOSIS — R042 Hemoptysis: Secondary | ICD-10-CM | POA: Insufficient documentation

## 2012-05-08 DIAGNOSIS — R222 Localized swelling, mass and lump, trunk: Secondary | ICD-10-CM | POA: Insufficient documentation

## 2012-05-08 DIAGNOSIS — J852 Abscess of lung without pneumonia: Secondary | ICD-10-CM

## 2012-05-08 LAB — AFB CULTURE WITH SMEAR (NOT AT ARMC)

## 2012-05-08 MED ORDER — MOXIFLOXACIN HCL 400 MG PO TABS: 400.0000 mg | ORAL_TABLET | Freq: Every day | ORAL | Status: DC

## 2012-05-08 MED ORDER — METRONIDAZOLE 250 MG PO TABS: 500.0000 mg | ORAL_TABLET | Freq: Three times a day (TID) | ORAL | Status: DC

## 2012-05-08 NOTE — Progress Notes (Signed)
Quick Note:  Notify the patient that the Xray still shows the lung abscess, unchanged Continue current meds as prescribed at todays office visit ______

## 2012-05-08 NOTE — Patient Instructions (Addendum)
Stop Aspirin Stop prilosec Start Avelox one daily for 7days Extend metronidazole for 10 more days Stay on Align Chest xray today Return 2 weeks

## 2012-05-08 NOTE — Progress Notes (Signed)
Subjective:    Patient ID: Alexander Goodwin, male    DOB: 11/11/1920, 78 y.o.   MRN: [id number]  HPI  78 y.o. WM This patient is referred for evaluation of new onset right upper lobe masslike lesions in the setting of prior left upper lobe nodular density with negative workup in the past including bronchoscopy Left upper lobe lesion has regressed the right upper lobe lesions are noted  Pt noted onset of phlegm in throat and noted hemoptysis several weeks ago.  Pt notes hoarseness and cough at night for several months.  Cough is worse after eating. Pt denies dysphagia. Exsmoker.   No prior PNA.  No Copd Dx.   No TB exposure.  Weight is sl up.  Appt is ok No pain.  Pt notes poor energy.  Blood is mixed with mucus and mucus is gray. This patient because of the above symptom complex had radiographs performed which showed abnormalities and subsequent CT of the chest was performed showing right upper lobe lesions which are negative the patient is referred for further evaluation  04/10/2012 Cough is better.  No real chest pain.   Pt denies any significant sore throat, nasal congestion or excess secretions, fever, chills, sweats, unintended weight loss, pleurtic or exertional chest pain, orthopnea PND, or leg swelling Pt denies any increase in rescue therapy over baseline, denies waking up needing it or having any early am or nocturnal exacerbations of coughing/wheezing/or dyspnea. Pt also denies any obvious fluctuation in symptoms with  weather or environmental change or other alleviating or aggravating factors  05/08/2012 Started coughing blood up again over past weekend.  Dark blood, mixed with mucus.  Prior to weekend no mucus to bring up.  No fever.  No chest pain.  No real change in dyspnea. Pt notes more fatigue.  Weight is the same.  No dysphagia is noted. Note pt had colitis with augmentin. C Diff positive. Now on flagyl and align.   Past Medical History  Diagnosis Date  . Diverticulitis of  colon   . History of colon polyps   . Arthritis     right knee, hips, neck. He denies any major limitation in activities  . GERD (gastroesophageal reflux disease)     well controlled with medication. No nocturnal symptoms on a regular basis  . Glaucoma     both eyes, followed closely by Dr. Eulah Pont  . Esophagitis     Last EGD Fall '11   . Solitary lung nodule     LUL, stable over several years. s/p eval LHC-pulmonary  . Pancreatitis     resolved after GB surgery  . Stroke     TIAs - age 63  . BPH (benign prostatic hypertrophy) with urinary obstruction   . Hyperlipemia   . Hypertension   . Seasonal allergies      Family History  Problem Relation Age of Onset  . Diabetes Mother   . Hypertension Mother   . Heart disease Mother   . Stroke Father   . Heart disease Father   . Diabetes Father   . Cancer Sister     uterine cancer  . Cancer Brother   . COPD Brother   . Hypertension Brother   . Cancer Brother   . Cancer Sister     gyn malignancy  . Cancer Sister     pancreatic cancer  . Allergies Sister   . Allergies Son   . Rheum arthritis Father   . Asthma Sister   .  Asthma Son      History   Social History  . Marital Status: Married    Spouse Name: N/A    Number of Children: 3  . Years of Education: HSG   Occupational History  . 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 Smoking status: Former Smoker -- 0.5 packs/day for 30 years    Types: Cigarettes    Quit date: 08/20/1978  . Smokeless tobacco: Former Neurosurgeon    Types: Chew    Quit date: 11/16/1990  . Alcohol Use: No  . Drug Use: No  . Sexually Active: Yes   Other Topics Concern  . Not on file   Social History Narrative   HSG.   Married 1948. 3 sons - '52, '57,'58- 5 grandchildren, one deceased at 89 - OD.  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 Marriage in good health. End of Life Care -DNR/DNI,  No prolonged heroic or  futile measures.      Allergies  Allergen Reactions  . Clindamycin/Lincomycin     itching     Outpatient Prescriptions Prior to Visit  Medication Sig Dispense Refill  . acetaminophen (TYLENOL ARTHRITIS PAIN) 650 MG CR tablet Take 650 mg by mouth every 8 (eight) hours as needed.        Marland Kitchen amLODipine (NORVASC) 10 MG tablet Take 10 mg by mouth daily.        . brimonidine (ALPHAGAN P) 0.1 % SOLN Twice daily to left eye and three times daily to right eye      . calcium-vitamin D (OSCAL 500/200 D-3) 500-200 MG-UNIT per tablet Take 1 tablet by mouth daily.        . dorzolamide-timolol (COSOPT) 22.3-6.8 MG/ML ophthalmic solution Place 1 drop into both eyes 2 (two) times daily.       Marland Kitchen doxazosin (CARDURA) 8 MG tablet TAKE 1 TABLET BY MOUTH EVERY DAY  30 tablet  6  . furosemide (LASIX) 40 MG tablet Take 20 mg by mouth as needed.       . latanoprost (XALATAN) 0.005 % ophthalmic solution Place 1 drop into both eyes at bedtime.      . Multiple Vitamins-Minerals (OCUVITE ADULT FORMULA PO) Take by mouth daily.        Marland Kitchen aspirin 81 MG tablet Take 81 mg by mouth daily.        . Omeprazole Magnesium (PRILOSEC OTC PO) Take by mouth daily.        Marland Kitchen levofloxacin (LEVAQUIN) 500 MG tablet Take one tablet per day for 5 days  5 tablet  0       Review of Systems  Constitutional: Positive for fatigue. Negative for fever, chills, diaphoresis, activity change, appetite change and unexpected weight change.  HENT: Positive for hearing loss, congestion, rhinorrhea, dental problem, voice change and postnasal drip. Negative for ear pain, nosebleeds, sore throat, facial swelling, sneezing, mouth sores, trouble swallowing, neck pain, neck stiffness, sinus pressure, tinnitus and ear discharge.   Eyes: Positive for discharge and itching. Negative for photophobia and visual disturbance.  Respiratory: Positive for cough and shortness of breath. Negative for apnea, choking, chest tightness, wheezing and stridor.     Cardiovascular: Negative for chest pain, palpitations and leg swelling.  Gastrointestinal: Negative for nausea, vomiting, abdominal pain, constipation, blood in stool and abdominal distention.  Genitourinary: Positive for frequency. Negative for dysuria, urgency, hematuria, flank pain, decreased urine volume and difficulty  urinating.  Musculoskeletal: Negative for myalgias, back pain, joint swelling, arthralgias and gait problem.  Skin: Negative for color change, pallor and rash.  Neurological: Negative for dizziness, tremors, seizures, syncope, speech difficulty, weakness, light-headedness, numbness and headaches.  Hematological: Negative for adenopathy. Bruises/bleeds easily.  Psychiatric/Behavioral: Negative for confusion, disturbed wake/sleep cycle and agitation. The patient is nervous/anxious.        Objective:   Physical Exam  Filed Vitals:   05/08/12 1121  BP: 132/76  Pulse: 57  Temp: 97.7 F (36.5 C)  TempSrc: Oral  Height: 5\' 5"  (1.651 m)  Weight: 165 lb (74.844 kg)  SpO2: 97%    Gen: Pleasant, well-nourished, elderly male  in no distress,  normal affect  ENT: No lesions,  mouth clear,  oropharynx clear, no postnasal drip  Neck: No JVD, no TMG, no carotid bruits  Lungs: No use of accessory muscles, no dullness to percussion, clear without rales or rhonchi  Cardiovascular: RRR, heart sounds normal, no murmur or gallops, no peripheral edema  Abdomen: soft and NT, no HSM,  BS normal  Musculoskeletal: No deformities, no cyanosis or clubbing  Neuro: alert, non focal  Skin: Warm, no lesions or rashes   CT chest 03/10/2012 IMPRESSION:  1. New 3.0 x 2.3 cm mass in the right apex with spiculated margins  and pleural retraction. In addition, there is a new mass-like  lesion in the inferior right upper lobe with areas of internal  cavitation. Both of these lesions are nonspecific, but are  concerning. Differential considerations would include both  infection  (particularly given the prior history of a large cavitary  lesion in the left apex demonstrated on prior chest CT 04/08/2001)  or neoplasm for both lesions.   Dg Chest 2 View  05/08/2012  *RADIOLOGY REPORT*  Clinical Data: Hemoptysis, lung abscess versus cavitary mass right upper lobe  CHEST - 2 VIEW  Comparison: 04/10/2012  Findings: Borderline enlargement of cardiac silhouette. Calcified tortuous aorta. Pulmonary vascularity normal. Again identified opacity with central lucency identified in right upper lobe adjacent to minor fissure, 5.7 x 3.6 cm, slightly increased in size. Additional focal opacity at the medial right upper lobe consistent with additional mass, 3.0 x 2.9 cm, grossly unchanged. Question scarring left apex. Remaining lungs clear. No pleural effusion or pneumothorax. Bones appear slightly demineralized. Chronic compression deformity of a mid thoracic vertebra, stable.  IMPRESSION: Persistent cavitary mass in right upper lobe adjacent to minor fissure slightly increased in size since previous exam, question abscess though tumor is not excluded with this radiographic appearance. Additional non cavitary mass at right apex, unchanged, tumor not excluded. No significant interval change.   Original Report Authenticated By: Lollie Marrow, M.D.        Assessment & Plan:   Lung abscess Recurrent right upper lobe lung mass due to microaspiration. Note this could still represent malignancy despite negative bronchoscopy. This patient is not a candidate for surgical intervention given his age and other medical conditions.  Plan Repeat Avelox course 4 mg daily for 7 days The patient will receive extended course of metronidazole to cover for recurrent Clostridium difficile colitis  Continue probiotic  Will hold off on further diagnostic procedures given the patient's age and concomitant conditions.     Updated Medication List Outpatient Encounter Prescriptions as of 05/08/2012  Medication  Sig Dispense Refill  . acetaminophen (TYLENOL ARTHRITIS PAIN) 650 MG CR tablet Take 650 mg by mouth every 8 (eight) hours as needed.        Marland Kitchen  amLODipine (NORVASC) 10 MG tablet Take 10 mg by mouth daily.        . brimonidine (ALPHAGAN P) 0.1 % SOLN Twice daily to left eye and three times daily to right eye      . calcium-vitamin D (OSCAL 500/200 D-3) 500-200 MG-UNIT per tablet Take 1 tablet by mouth daily.        . dorzolamide-timolol (COSOPT) 22.3-6.8 MG/ML ophthalmic solution Place 1 drop into both eyes 2 (two) times daily.       Marland Kitchen doxazosin (CARDURA) 8 MG tablet TAKE 1 TABLET BY MOUTH EVERY DAY  30 tablet  6  . furosemide (LASIX) 40 MG tablet Take 20 mg by mouth as needed.       . latanoprost (XALATAN) 0.005 % ophthalmic solution Place 1 drop into both eyes at bedtime.      . Multiple Vitamins-Minerals (OCUVITE ADULT FORMULA PO) Take by mouth daily.        . Probiotic Product (ALIGN PO) Take 2 capsules by mouth daily.      Marland Kitchen DISCONTD: aspirin 81 MG tablet Take 81 mg by mouth daily.        Marland Kitchen DISCONTD: metroNIDAZOLE (FLAGYL) 250 MG tablet Take 500 mg by mouth 3 (three) times daily.      Marland Kitchen DISCONTD: metroNIDAZOLE (FLAGYL) 250 MG tablet Take 2 tablets (500 mg total) by mouth 3 (three) times daily.  30 tablet  0  . DISCONTD: Omeprazole Magnesium (PRILOSEC OTC PO) Take by mouth daily.        Marland Kitchen DISCONTD: levofloxacin (LEVAQUIN) 500 MG tablet Take one tablet per day for 5 days  5 tablet  0  . DISCONTD: moxifloxacin (AVELOX) 400 MG tablet Take 1 tablet (400 mg total) by mouth daily.  7 tablet  0

## 2012-05-08 NOTE — Assessment & Plan Note (Signed)
Recurrent right upper lobe lung mass due to microaspiration. Moderate assist due to same Plan Repeat Avelox course 4 mg daily for 7 days The patient will receive extended course of metronidazole to cover for recurrent Clostridium difficile colitis  Continue probiotic  Review chest x-ray

## 2012-05-12 ENCOUNTER — Telehealth: Payer: Self-pay | Admitting: Critical Care Medicine

## 2012-05-12 NOTE — Telephone Encounter (Signed)
Result Note     Notify the patient that the Xray still shows the lung abscess, unchanged   Continue current meds as prescribed at todays office visit   I spoke with patient about results and he verbalized understanding and had no questions

## 2012-05-13 ENCOUNTER — Telehealth: Payer: Self-pay | Admitting: Critical Care Medicine

## 2012-05-13 NOTE — Telephone Encounter (Signed)
i dont know how much time he has.  He should make that decision with his family

## 2012-05-13 NOTE — Telephone Encounter (Signed)
Spoke with pt. I went over his cxr results again, and he understands that there is no change in the abscess, but he is wanting to know "how serious is it"? He states that his children want to have him and his spouse move back home from retirement facility, but if he is not going to be around for much longer he wants to stay where he is. I advised that he should keep pending ov with PW for 05/22/12 and they can discuss this at that time. He states will keep appt, but needs advise from PW sooner since he has to make a decision on his living situation. PW, please advise, thanks!

## 2012-05-13 NOTE — Telephone Encounter (Signed)
PT called back wanting to speak to nurse about xray results Leanora Ivanoff

## 2012-05-13 NOTE — Telephone Encounter (Signed)
Spoke with pt and notified of recs per PW He verbalized understanding and states nothing further needed 

## 2012-05-15 ENCOUNTER — Telehealth: Payer: Self-pay | Admitting: Critical Care Medicine

## 2012-05-15 NOTE — Telephone Encounter (Signed)
Looks like RX was [employer] sent by [employer] to med center [facility] but then recent by nurse to [facility]. I have caleld med center in [place] since this was not cancelled after the 2nd prescription was sent to other pharmacy to cancel avelox rx from 05/08/12. I have called and made pt aware of this. He understood and needed nothing further

## 2012-05-22 ENCOUNTER — Encounter: Payer: Self-pay | Admitting: Critical Care Medicine

## 2012-05-22 ENCOUNTER — Ambulatory Visit (INDEPENDENT_AMBULATORY_CARE_PROVIDER_SITE_OTHER): Payer: Medicare Other | Admitting: Critical Care Medicine

## 2012-05-22 VITALS — BP 118/60 | HR 55 | Temp 97.9°F | Ht 66.0 in | Wt 161.0 lb

## 2012-05-22 DIAGNOSIS — J852 Abscess of lung without pneumonia: Secondary | ICD-10-CM | POA: Diagnosis not present

## 2012-05-22 MED ORDER — FAMOTIDINE 40 MG PO TABS: 40.0000 mg | ORAL_TABLET | Freq: Every day | ORAL | Status: DC

## 2012-05-22 NOTE — Patient Instructions (Addendum)
Stop omeprazole Start Pepcid 40mg  at bedtime No further antibiotics Follow a reflux diet No surgery for now Return 2 months or sooner if worse

## 2012-05-22 NOTE — Progress Notes (Signed)
Subjective:    Patient ID: Alexander Goodwin, male    DOB: [birth year], 78 y.o.   MRN: [id number]  HPI  78 y.o. WM This patient is referred for evaluation of new onset right upper lobe masslike lesions in the setting of prior left upper lobe nodular density with negative workup in the past including bronchoscopy Left upper lobe lesion has regressed the right upper lobe lesions are noted  Pt noted onset of phlegm in throat and noted hemoptysis several weeks ago.  Pt notes hoarseness and cough at night for several months.  Cough is worse after eating. Pt denies dysphagia. Exsmoker.   No prior PNA.  No Copd Dx.   No TB exposure.  Weight is sl up.  Appt is ok No pain.  Pt notes poor energy.  Blood is mixed with mucus and mucus is gray. This patient because of the above symptom complex had radiographs performed which showed abnormalities and subsequent CT of the chest was performed showing right upper lobe lesions which are negative the patient is referred for further evaluation  04/10/2012 Cough is better.  No real chest pain.   Pt denies any significant sore throat, nasal congestion or excess secretions, fever, chills, sweats, unintended weight loss, pleurtic or exertional chest pain, orthopnea PND, or leg swelling Pt denies any increase in rescue therapy over baseline, denies waking up needing it or having any early am or nocturnal exacerbations of coughing/wheezing/or dyspnea. Pt also denies any obvious fluctuation in symptoms with  weather or environmental change or other alleviating or aggravating factors  05/08/2012 Started coughing blood up again over past weekend.  Dark blood, mixed with mucus.  Prior to weekend no mucus to bring up.  No fever.  No chest pain.  No real change in dyspnea. Pt notes more fatigue.  Weight is the same.  No dysphagia is noted. Note pt had colitis with augmentin. C Diff positive. Now on flagyl and align.  05/22/2012 Finished ABX. Not much change in coughing. Past  Medical History  Diagnosis Date  . Diverticulitis of colon   . History of colon polyps   . Arthritis     right knee, hips, neck. He denies any major limitation in activities  . GERD (gastroesophageal reflux disease)     well controlled with medication. No nocturnal symptoms on a regular basis  . Glaucoma     both eyes, followed closely by Dr. Eulah Pont  . Esophagitis     Last EGD Fall '11   . Solitary lung nodule     LUL, stable over several years. s/p eval LHC-pulmonary  . Pancreatitis     resolved after GB surgery  . Stroke     TIAs - age 77  . BPH (benign prostatic hypertrophy) with urinary obstruction   . Hyperlipemia   . Hypertension   . Seasonal allergies      Family History  Problem Relation Age of Onset  . Diabetes Mother   . Hypertension Mother   . Heart disease Mother   . Stroke Father   . Heart disease Father   . Diabetes Father   . Cancer Sister     uterine cancer  . Cancer Brother   . COPD Brother   . Hypertension Brother   . Cancer Brother   . Cancer Sister     gyn malignancy  . Cancer Sister     pancreatic cancer  . Allergies Sister   . Allergies Son   . Rheum arthritis Father   .  Asthma Sister   . Asthma Son      History   Social History  . Marital Status: Married    Spouse Name: N/A    Number of Children: 3  . Years of Education: HSG   Occupational History  . 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 Smoking status: Former Smoker -- 0.5 packs/day for 30 years    Types: Cigarettes    Quit date: 08/20/1978  . Smokeless tobacco: Former Neurosurgeon    Types: Chew    Quit date: 11/16/1990  . Alcohol Use: No  . Drug Use: No  . Sexually Active: Yes   Other Topics Concern  . Not on file   Social History Narrative   HSG.   Married 1948. 3 sons - '52, '57,'58- 5 grandchildren, one deceased at 66 - OD.  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 Marriage in good  health. End of Life Care -DNR/DNI,  No prolonged heroic or futile measures.      Allergies  Allergen Reactions  . Clindamycin/Lincomycin     itching     Outpatient Prescriptions Prior to Visit  Medication Sig Dispense Refill  . acetaminophen (TYLENOL ARTHRITIS PAIN) 650 MG CR tablet Take 650 mg by mouth every 8 (eight) hours as needed.        Marland Kitchen amLODipine (NORVASC) 10 MG tablet Take 10 mg by mouth daily.        . brimonidine (ALPHAGAN P) 0.1 % SOLN Twice daily to left eye and three times daily to right eye      . calcium-vitamin D (OSCAL 500/200 D-3) 500-200 MG-UNIT per tablet Take 1 tablet by mouth daily.        . dorzolamide-timolol (COSOPT) 22.3-6.8 MG/ML ophthalmic solution Place 1 drop into both eyes 2 (two) times daily.       Marland Kitchen doxazosin (CARDURA) 8 MG tablet TAKE 1 TABLET BY MOUTH EVERY DAY  30 tablet  6  . furosemide (LASIX) 40 MG tablet Take 20 mg by mouth as needed.       . latanoprost (XALATAN) 0.005 % ophthalmic solution Place 1 drop into both eyes at bedtime.      . Multiple Vitamins-Minerals (OCUVITE ADULT FORMULA PO) Take by mouth daily.        . metroNIDAZOLE (FLAGYL) 250 MG tablet Take 2 tablets (500 mg total) by mouth 3 (three) times daily.  30 tablet  0  . moxifloxacin (AVELOX) 400 MG tablet Take 1 tablet (400 mg total) by mouth daily.  7 tablet  0  . Probiotic Product (ALIGN PO) Take 2 capsules by mouth daily.           Review of Systems  Constitutional: Positive for fatigue. Negative for fever, chills, diaphoresis, activity change, appetite change and unexpected weight change.  HENT: Positive for hearing loss, congestion, rhinorrhea, dental problem, voice change and postnasal drip. Negative for ear pain, nosebleeds, sore throat, facial swelling, sneezing, mouth sores, trouble swallowing, neck pain, neck stiffness, sinus pressure, tinnitus and ear discharge.   Eyes: Positive for discharge and itching. Negative for photophobia and visual disturbance.  Respiratory:  Positive for cough and shortness of breath. Negative for apnea, choking, chest tightness, wheezing and stridor.   Cardiovascular: Negative for chest pain, palpitations and leg swelling.  Gastrointestinal: Negative for nausea, vomiting, abdominal pain, constipation, blood in stool and abdominal distention.  Genitourinary: Positive for frequency. Negative for  dysuria, urgency, hematuria, flank pain, decreased urine volume and difficulty urinating.  Musculoskeletal: Negative for myalgias, back pain, joint swelling, arthralgias and gait problem.  Skin: Negative for color change, pallor and rash.  Neurological: Negative for dizziness, tremors, seizures, syncope, speech difficulty, weakness, light-headedness, numbness and headaches.  Hematological: Negative for adenopathy. Bruises/bleeds easily.  Psychiatric/Behavioral: Negative for confusion, disturbed wake/sleep cycle and agitation. The patient is nervous/anxious.        Objective:   Physical Exam  Filed Vitals:   05/22/12 1416  BP: 118/60  Pulse: 55  Temp: 97.9 F (36.6 C)  TempSrc: Oral  Height: 5\' 6"  (1.676 m)  Weight: 73.029 kg (161 lb)  SpO2: 98%    Gen: Pleasant, well-nourished, elderly male  in no distress,  normal affect  ENT: No lesions,  mouth clear,  oropharynx clear, no postnasal drip  Neck: No JVD, no TMG, no carotid bruits  Lungs: No use of accessory muscles, no dullness to percussion, clear without rales or rhonchi  Cardiovascular: RRR, heart sounds normal, no murmur or gallops, no peripheral edema  Abdomen: soft and NT, no HSM,  BS normal  Musculoskeletal: No deformities, no cyanosis or clubbing  Neuro: alert, non focal  Skin: Warm, no lesions or rashes   CT chest 03/10/2012 IMPRESSION:  1. New 3.0 x 2.3 cm mass in the right apex with spiculated margins  and pleural retraction. In addition, there is a new mass-like  lesion in the inferior right upper lobe with areas of internal  cavitation. Both of these  lesions are nonspecific, but are  concerning. Differential considerations would include both  infection (particularly given the prior history of a large cavitary  lesion in the left apex demonstrated on prior chest CT 04/08/2001)  or neoplasm for both lesions.   No results found.     Assessment & Plan:   Lung abscess Lung abscess, unchanged paln Stop omeprazole Start Pepcid 40mg  at bedtime No further antibiotics Follow a reflux diet No plans for surgical approach    Updated Medication List Outpatient Encounter Prescriptions as of 05/22/2012  Medication Sig Dispense Refill  . acetaminophen (TYLENOL ARTHRITIS PAIN) 650 MG CR tablet Take 650 mg by mouth every 8 (eight) hours as needed.        Marland Kitchen amLODipine (NORVASC) 10 MG tablet Take 10 mg by mouth daily.        . brimonidine (ALPHAGAN P) 0.1 % SOLN Twice daily to left eye and three times daily to right eye      . calcium-vitamin D (OSCAL 500/200 D-3) 500-200 MG-UNIT per tablet Take 1 tablet by mouth daily.        . dorzolamide-timolol (COSOPT) 22.3-6.8 MG/ML ophthalmic solution Place 1 drop into both eyes 2 (two) times daily.       Marland Kitchen doxazosin (CARDURA) 8 MG tablet TAKE 1 TABLET BY MOUTH EVERY DAY  30 tablet  6  . furosemide (LASIX) 40 MG tablet Take 20 mg by mouth as needed.       . latanoprost (XALATAN) 0.005 % ophthalmic solution Place 1 drop into both eyes at bedtime.      . Multiple Vitamins-Minerals (OCUVITE ADULT FORMULA PO) Take by mouth daily.        . famotidine (PEPCID) 40 MG tablet Take 1 tablet (40 mg total) by mouth at bedtime.  30 tablet  6  . DISCONTD: metroNIDAZOLE (FLAGYL) 250 MG tablet Take 2 tablets (500 mg total) by mouth 3 (three) times daily.  30 tablet  0  .  DISCONTD: moxifloxacin (AVELOX) 400 MG tablet Take 1 tablet (400 mg total) by mouth daily.  7 tablet  0  . DISCONTD: Probiotic Product (ALIGN PO) Take 2 capsules by mouth daily.

## 2012-05-24 NOTE — Assessment & Plan Note (Signed)
Lung abscess, unchanged paln Stop omeprazole Start Pepcid 40mg  at bedtime No further antibiotics Follow a reflux diet No plans for surgical approach

## 2012-05-28 DIAGNOSIS — R918 Other nonspecific abnormal finding of lung field: Secondary | ICD-10-CM | POA: Diagnosis not present

## 2012-05-28 DIAGNOSIS — K5289 Other specified noninfective gastroenteritis and colitis: Secondary | ICD-10-CM | POA: Diagnosis not present

## 2012-05-28 DIAGNOSIS — I10 Essential (primary) hypertension: Secondary | ICD-10-CM | POA: Diagnosis not present

## 2012-06-05 ENCOUNTER — Ambulatory Visit: Payer: Medicare Other | Admitting: Critical Care Medicine

## 2012-06-11 ENCOUNTER — Ambulatory Visit (INDEPENDENT_AMBULATORY_CARE_PROVIDER_SITE_OTHER)
Admission: RE | Admit: 2012-06-11 | Discharge: 2012-06-11 | Disposition: A | Discharge status: Home / Self Care | Payer: Medicare Other | Source: Ambulatory Visit | Attending: Critical Care Medicine | Admitting: Critical Care Medicine

## 2012-06-11 ENCOUNTER — Encounter: Payer: Self-pay | Admitting: Critical Care Medicine

## 2012-06-11 ENCOUNTER — Ambulatory Visit (INDEPENDENT_AMBULATORY_CARE_PROVIDER_SITE_OTHER): Payer: Medicare Other | Admitting: Critical Care Medicine

## 2012-06-11 VITALS — BP 102/60 | HR 74 | Temp 97.3°F | Ht 65.0 in | Wt 156.2 lb

## 2012-06-11 DIAGNOSIS — R042 Hemoptysis: Secondary | ICD-10-CM

## 2012-06-11 DIAGNOSIS — J984 Other disorders of lung: Secondary | ICD-10-CM | POA: Diagnosis not present

## 2012-06-11 DIAGNOSIS — J852 Abscess of lung without pneumonia: Secondary | ICD-10-CM | POA: Diagnosis not present

## 2012-06-11 DIAGNOSIS — K219 Gastro-esophageal reflux disease without esophagitis: Secondary | ICD-10-CM | POA: Diagnosis not present

## 2012-06-11 NOTE — Progress Notes (Signed)
Subjective:    Patient ID: Alexander Goodwin, male    DOB: 1920/11/08, 78 y.o.   MRN: [id number]  HPI  78 y.o. WM This patient is referred for evaluation of new onset right upper lobe masslike lesions in the setting of prior left upper lobe nodular density with negative workup in the past including bronchoscopy Left upper lobe lesion has regressed the right upper lobe lesions are noted  Pt noted onset of phlegm in throat and noted hemoptysis several weeks ago.  Pt notes hoarseness and cough at night for several months.  Cough is worse after eating. Pt denies dysphagia. Exsmoker.   No prior PNA.  No Copd Dx.   No TB exposure.  Weight is sl up.  Appt is ok No pain.  Pt notes poor energy.  Blood is mixed with mucus and mucus is gray. This patient because of the above symptom complex had radiographs performed which showed abnormalities and subsequent CT of the chest was performed showing right upper lobe lesions which are negative the patient is referred for further evaluation  04/10/2012 Cough is better.  No real chest pain.   Pt denies any significant sore throat, nasal congestion or excess secretions, fever, chills, sweats, unintended weight loss, pleurtic or exertional chest pain, orthopnea PND, or leg swelling Pt denies any increase in rescue therapy over baseline, denies waking up needing it or having any early am or nocturnal exacerbations of coughing/wheezing/or dyspnea. Pt also denies any obvious fluctuation in symptoms with  weather or environmental change or other alleviating or aggravating factors  05/08/2012 Started coughing blood up again over past weekend.  Dark blood, mixed with mucus.  Prior to weekend no mucus to bring up.  No fever.  No chest pain.  No real change in dyspnea. Pt notes more fatigue.  Weight is the same.  No dysphagia is noted. Note pt had colitis with augmentin. C Diff positive. Now on flagyl and align.  05/22/2012 Finished ABX. Not much change in  coughing.   06/11/2012 Pt notes dark red mucus, 1/2 tsp  In past week.  No chest pain.  No fever.  No wheezing.  Not that dyspnea. Pt notes buring in abdomen.     Past Medical History  Diagnosis Date  . Diverticulitis of colon   . History of colon polyps   . Arthritis     right knee, hips, neck. He denies any major limitation in activities  . GERD (gastroesophageal reflux disease)     well controlled with medication. No nocturnal symptoms on a regular basis  . Glaucoma(365)     both eyes, followed closely by Dr. Eulah Pont  . Esophagitis     Last EGD Fall '11   . Solitary lung nodule     LUL, stable over several years. s/p eval LHC-pulmonary  . Pancreatitis     resolved after GB surgery  . Stroke     TIAs - age 39  . BPH (benign prostatic hypertrophy) with urinary obstruction   . Hyperlipemia   . Hypertension   . Seasonal allergies      Family History  Problem Relation Age of Onset  . Diabetes Mother   . Hypertension Mother   . Heart disease Mother   . Stroke Father   . Heart disease Father   . Diabetes Father   . Cancer Sister     uterine cancer  . Cancer Brother   . COPD Brother   . Hypertension Brother   . Cancer Brother   .  Cancer Sister     gyn malignancy  . Cancer Sister     pancreatic cancer  . Allergies Sister   . Allergies Son   . Rheum arthritis Father   . Asthma Sister   . Asthma Son      History   Social History  . Marital Status: Married    Spouse Name: N/A    Number of Children: 3  . Years of Education: HSG   Occupational History  . 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 Smoking status: Former Smoker -- 0.5 packs/day for 30 years    Types: Pipe, Cigars    Quit date: 08/20/1978  . Smokeless tobacco: Former Neurosurgeon    Types: Chew    Quit date: 11/16/1990  . Alcohol Use: No  . Drug Use: No  . Sexually Active: Yes   Other Topics Concern  . Not on file   Social History  Narrative   HSG.   Married 1948. 3 sons - '52, '57,'58- 5 grandchildren, one deceased at 83 - OD.  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 Marriage in good health. End of Life Care -DNR/DNI,  No prolonged heroic or futile measures.      Allergies  Allergen Reactions  . Clindamycin/Lincomycin     itching     Outpatient Prescriptions Prior to Visit  Medication Sig Dispense Refill  . acetaminophen (TYLENOL ARTHRITIS PAIN) 650 MG CR tablet Take 650 mg by mouth every 8 (eight) hours as needed.        Marland Kitchen amLODipine (NORVASC) 10 MG tablet Take 10 mg by mouth daily.        . brimonidine (ALPHAGAN P) 0.1 % SOLN Twice daily to left eye and three times daily to right eye      . calcium-vitamin D (OSCAL 500/200 D-3) 500-200 MG-UNIT per tablet Take 1 tablet by mouth 2 (two) times daily.       . dorzolamide-timolol (COSOPT) 22.3-6.8 MG/ML ophthalmic solution Place 1 drop into both eyes 2 (two) times daily.       Marland Kitchen doxazosin (CARDURA) 8 MG tablet TAKE 1 TABLET BY MOUTH EVERY DAY  30 tablet  6  . famotidine (PEPCID) 40 MG tablet Take 1 tablet (40 mg total) by mouth at bedtime.  30 tablet  6  . furosemide (LASIX) 40 MG tablet Take 20 mg by mouth as needed.       . latanoprost (XALATAN) 0.005 % ophthalmic solution Place 1 drop into both eyes at bedtime.      . Multiple Vitamins-Minerals (OCUVITE ADULT FORMULA PO) Take by mouth daily.             Review of Systems  Constitutional: Positive for fatigue. Negative for fever, chills, diaphoresis, activity change, appetite change and unexpected weight change.  HENT: Positive for hearing loss, congestion, rhinorrhea, dental problem, voice change and postnasal drip. Negative for ear pain, nosebleeds, sore throat, facial swelling, sneezing, mouth sores, trouble swallowing, neck pain, neck stiffness, sinus pressure, tinnitus and ear discharge.   Eyes: Positive for discharge and itching. Negative for photophobia and visual disturbance.   Respiratory: Positive for cough and shortness of breath. Negative for apnea, choking, chest tightness, wheezing and stridor.   Cardiovascular: Negative for chest pain, palpitations and leg swelling.  Gastrointestinal: Negative for nausea, vomiting, abdominal pain, constipation, blood in stool and abdominal distention.  Genitourinary: Positive for frequency. Negative for  dysuria, urgency, hematuria, flank pain, decreased urine volume and difficulty urinating.  Musculoskeletal: Negative for myalgias, back pain, joint swelling, arthralgias and gait problem.  Skin: Negative for color change, pallor and rash.  Neurological: Negative for dizziness, tremors, seizures, syncope, speech difficulty, weakness, light-headedness, numbness and headaches.  Hematological: Negative for adenopathy. Bruises/bleeds easily.  Psychiatric/Behavioral: Negative for confusion, disturbed wake/sleep cycle and agitation. The patient is nervous/anxious.        Objective:   Physical Exam  Filed Vitals:   06/11/12 1153  BP: 102/60  Pulse: 74  Temp: 97.3 F (36.3 C)  TempSrc: Oral  Height: 5\' 5"  (1.651 m)  Weight: 156 lb 3.2 oz (70.852 kg)  SpO2: 97%    Gen: Pleasant, well-nourished, elderly male  in no distress,  normal affect  ENT: No lesions,  mouth clear,  oropharynx clear, no postnasal drip  Neck: No JVD, no TMG, no carotid bruits  Lungs: No use of accessory muscles, no dullness to percussion, clear without rales or rhonchi  Cardiovascular: RRR, heart sounds normal, no murmur or gallops, no peripheral edema  Abdomen: soft and NT, no HSM,  BS normal  Musculoskeletal: No deformities, no cyanosis or clubbing  Neuro: alert, non focal  Skin: Warm, no lesions or rashes   CT chest 03/10/2012 IMPRESSION:  1. New 3.0 x 2.3 cm mass in the right apex with spiculated margins  and pleural retraction. In addition, there is a new mass-like  lesion in the inferior right upper lobe with areas of internal   cavitation. Both of these lesions are nonspecific, but are  concerning. Differential considerations would include both  infection (particularly given the prior history of a large cavitary  lesion in the left apex demonstrated on prior chest CT 04/08/2001)  or neoplasm for both lesions.   Dg Chest 2 View  06/11/2012  *RADIOLOGY REPORT*  Clinical Data: Hemoptysis, lung cavitation  CHEST - 2 VIEW  Comparison: 05/08/2012  Findings: Cavitary lesion in the right mid lung measures 3.6 x 5.1 cm, mildly decreased.  Cavitary lesion in the right lung apex measures 2.9 x 3.1 cm, unchanged.  No pleural effusion or pneumothorax.  Cardiomediastinal silhouette is within normal limits.  Mild to moderate compression deformities of two mid thoracic vertebral bodies, unchanged.  IMPRESSION: 2.6 x 5.1 cm cavitary lesion in the right mid lung, mildly decreased.  2.9 x 3.1 cm cavitary lesion in the right lung apex, unchanged.   Original Report Authenticated By: Charline Bills, M.D.        Assessment & Plan:   Lung abscess Lung abscess, unchanged in RUL MIld hemoptysis without infection seen GERD and high level reflux associated with microaspiration with mild oropharyngeal dysfunction Hx of C diff colitis with Rx of lung abscess with cleocin Plan GI referral COnt pepcid     Updated Medication List Outpatient Encounter Prescriptions as of 06/11/2012  Medication Sig Dispense Refill  . acetaminophen (TYLENOL ARTHRITIS PAIN) 650 MG CR tablet Take 650 mg by mouth every 8 (eight) hours as needed.        Marland Kitchen amLODipine (NORVASC) 10 MG tablet Take 10 mg by mouth daily.        . brimonidine (ALPHAGAN P) 0.1 % SOLN Twice daily to left eye and three times daily to right eye      . calcium-vitamin D (OSCAL 500/200 D-3) 500-200 MG-UNIT per tablet Take 1 tablet by mouth 2 (two) times daily.       . dorzolamide-timolol (COSOPT) 22.3-6.8 MG/ML ophthalmic solution Place  1 drop into both eyes 2 (two) times daily.       Marland Kitchen  doxazosin (CARDURA) 8 MG tablet TAKE 1 TABLET BY MOUTH EVERY DAY  30 tablet  6  . famotidine (PEPCID) 40 MG tablet Take 1 tablet (40 mg total) by mouth at bedtime.  30 tablet  6  . furosemide (LASIX) 40 MG tablet Take 20 mg by mouth as needed.       . latanoprost (XALATAN) 0.005 % ophthalmic solution Place 1 drop into both eyes at bedtime.      . MetroNIDAZOLE (FLAGYL PO) Take 1 tablet by mouth daily. Unsure of strength      . Multiple Vitamins-Minerals (OCUVITE ADULT FORMULA PO) Take by mouth daily.

## 2012-06-11 NOTE — Assessment & Plan Note (Signed)
Lung abscess, unchanged in RUL MIld hemoptysis without infection seen GERD and high level reflux associated with microaspiration with mild oropharyngeal dysfunction Hx of C diff colitis with Rx of lung abscess with cleocin Plan GI referral COnt pepcid

## 2012-06-11 NOTE — Patient Instructions (Addendum)
No change in medications A referral to Gastroenterology will be made Call if bleeding worsens

## 2012-06-12 ENCOUNTER — Ambulatory Visit: Payer: Medicare Other | Admitting: Critical Care Medicine

## 2012-06-23 DIAGNOSIS — K219 Gastro-esophageal reflux disease without esophagitis: Secondary | ICD-10-CM | POA: Diagnosis not present

## 2012-07-11 DIAGNOSIS — B0239 Other herpes zoster eye disease: Secondary | ICD-10-CM | POA: Diagnosis not present

## 2012-07-16 DIAGNOSIS — B029 Zoster without complications: Secondary | ICD-10-CM | POA: Diagnosis not present

## 2012-07-16 DIAGNOSIS — H409 Unspecified glaucoma: Secondary | ICD-10-CM | POA: Diagnosis not present

## 2012-07-16 DIAGNOSIS — H4011X0 Primary open-angle glaucoma, stage unspecified: Secondary | ICD-10-CM | POA: Diagnosis not present

## 2012-07-21 DIAGNOSIS — B029 Zoster without complications: Secondary | ICD-10-CM | POA: Diagnosis not present

## 2012-07-21 DIAGNOSIS — R634 Abnormal weight loss: Secondary | ICD-10-CM | POA: Diagnosis not present

## 2012-07-21 DIAGNOSIS — R1013 Epigastric pain: Secondary | ICD-10-CM | POA: Diagnosis not present

## 2012-07-21 DIAGNOSIS — R10819 Abdominal tenderness, unspecified site: Secondary | ICD-10-CM | POA: Diagnosis not present

## 2012-07-23 ENCOUNTER — Encounter: Payer: Self-pay | Admitting: Critical Care Medicine

## 2012-07-23 ENCOUNTER — Ambulatory Visit (INDEPENDENT_AMBULATORY_CARE_PROVIDER_SITE_OTHER): Payer: Medicare Other | Admitting: Critical Care Medicine

## 2012-07-23 VITALS — BP 80/50 | HR 69 | Temp 97.8°F | Ht 65.0 in | Wt 153.8 lb

## 2012-07-23 DIAGNOSIS — J852 Abscess of lung without pneumonia: Secondary | ICD-10-CM

## 2012-07-23 NOTE — Progress Notes (Signed)
Subjective:    Patient ID: Alexander Goodwin, male    DOB: Apr 13, 1921, 78 y.o.   MRN: [id number]  HPI  78 y.o. WM This patient is referred for evaluation of new onset right upper lobe masslike lesions in the setting of prior left upper lobe nodular density with negative workup in the past including bronchoscopy Left upper lobe lesion has regressed the right upper lobe lesions are noted  Pt noted onset of phlegm in throat and noted hemoptysis several weeks ago.  Pt notes hoarseness and cough at night for several months.  Cough is worse after eating. Pt denies dysphagia. Exsmoker.   No prior PNA.  No Copd Dx.   No TB exposure.  Weight is sl up.  Appt is ok No pain.  Pt notes poor energy.  Blood is mixed with mucus and mucus is gray. This patient because of the above symptom complex had radiographs performed which showed abnormalities and subsequent CT of the chest was performed showing right upper lobe lesions which are negative the patient is referred for further evaluation  04/10/2012 Cough is better.  No real chest pain.   Pt denies any significant sore throat, nasal congestion or excess secretions, fever, chills, sweats, unintended weight loss, pleurtic or exertional chest pain, orthopnea PND, or leg swelling Pt denies any increase in rescue therapy over baseline, denies waking up needing it or having any early am or nocturnal exacerbations of coughing/wheezing/or dyspnea. Pt also denies any obvious fluctuation in symptoms with  weather or environmental change or other alleviating or aggravating factors  05/08/2012 Started coughing blood up again over past weekend.  Dark blood, mixed with mucus.  Prior to weekend no mucus to bring up.  No fever.  No chest pain.  No real change in dyspnea. Pt notes more fatigue.  Weight is the same.  No dysphagia is noted. Note pt had colitis with augmentin. C Diff positive. Now on flagyl and align.  05/22/2012 Finished ABX. Not much change in  coughing.   06/11/2012 Pt notes dark red mucus, 1/2 tsp  In past week.  No chest pain.  No fever.  No wheezing.  Not that dyspnea. Pt notes buring in abdomen.      07/23/2012 Pt still coughing dark blood and phlegm .  Pt saw [facility].  GI MD did not have any recs other than rx omeprazole. Pt given a diet to follow. No chest pain. No burning in abdomen.  Overate on thanksgiving but other wise ok.     Past Medical History  Diagnosis Date  . Diverticulitis of colon   . History of colon polyps   . Arthritis     right knee, hips, neck. He denies any major limitation in activities  . GERD (gastroesophageal reflux disease)     well controlled with medication. No nocturnal symptoms on a regular basis  . Glaucoma(365)     both eyes, followed closely by Dr. Eulah Pont  . Esophagitis     Last EGD Fall '11   . Solitary lung nodule     LUL, stable over several years. s/p eval [facility]-p[facility]  . Pancreatitis     resolved after GB surgery  . Stroke     TIAs - age 46  . BPH (benign prostatic hypertrophy) with urinary obstruction   . Hyperlipemia   . Hypertension   . Seasonal allergies      Family History  Problem Relation Age of Onset  . Diabetes Mother   . Hypertension Mother   .  Heart disease Mother   . Stroke Father   . Heart disease Father   . Diabetes Father   . Cancer Sister     uterine cancer  . Cancer Brother   . COPD Brother   . Hypertension Brother   . Cancer Brother   . Cancer Sister     gyn malignancy  . Cancer Sister     pancreatic cancer  . Allergies Sister   . Allergies Son   . Rheum arthritis Father   . Asthma Sister   . Asthma Son      History   Social History  . Marital Status: Married    Spouse Name: N/A    Number of Children: 3  . Years of Education: HSG   Occupational History  . 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 Smoking status: Former Smoker -- 0.5 packs/day for 30 years     Types: Pipe, Cigars    Quit date: 08/20/1978  . Smokeless tobacco: Former Neurosurgeon    Types: Chew    Quit date: 11/16/1990  . Alcohol Use: No  . Drug Use: No  . Sexually Active: Yes   Other Topics Concern  . Not on file   Social History Narrative   HSG.   Married 1948. 3 sons - '52, '57,'58- 5 grandchildren, one deceased at 52 - OD.  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 Marriage in good health. End of Life Care -DNR/DNI,  No prolonged heroic or futile measures.      Allergies  Allergen Reactions  . Clindamycin/Lincomycin     itching     Outpatient Prescriptions Prior to Visit  Medication Sig Dispense Refill  . acetaminophen (TYLENOL ARTHRITIS PAIN) 650 MG CR tablet Take 650 mg by mouth every 8 (eight) hours as needed.        Marland Kitchen amLODipine (NORVASC) 10 MG tablet Take 10 mg by mouth daily.        . brimonidine (ALPHAGAN P) 0.1 % SOLN Twice daily to left eye and three times daily to right eye      . calcium-vitamin D (OSCAL 500/200 D-3) 500-200 MG-UNIT per tablet Take 1 tablet by mouth 2 (two) times daily.       . dorzolamide-timolol (COSOPT) 22.3-6.8 MG/ML ophthalmic solution Place 1 drop into both eyes 2 (two) times daily.       Marland Kitchen doxazosin (CARDURA) 8 MG tablet TAKE 1 TABLET BY MOUTH EVERY DAY  30 tablet  6  . famotidine (PEPCID) 40 MG tablet Take 1 tablet (40 mg total) by mouth at bedtime.  30 tablet  6  . furosemide (LASIX) 40 MG tablet Take 20 mg by mouth as needed.       . latanoprost (XALATAN) 0.005 % ophthalmic solution Place 1 drop into both eyes at bedtime.      . MetroNIDAZOLE (FLAGYL PO) Take 1 tablet by mouth daily. Unsure of strength      . Multiple Vitamins-Minerals (OCUVITE ADULT FORMULA PO) Take by mouth daily.             Review of Systems  Constitutional: Positive for fatigue. Negative for fever, chills, diaphoresis, activity change, appetite change and unexpected weight change.  HENT: Positive for hearing loss, congestion, rhinorrhea,  dental problem, voice change and postnasal drip. Negative for ear pain, nosebleeds, sore throat, facial swelling, sneezing, mouth sores, trouble swallowing, neck pain, neck stiffness, sinus  pressure, tinnitus and ear discharge.   Eyes: Positive for discharge and itching. Negative for photophobia and visual disturbance.  Respiratory: Positive for cough and shortness of breath. Negative for apnea, choking, chest tightness, wheezing and stridor.   Cardiovascular: Negative for chest pain, palpitations and leg swelling.  Gastrointestinal: Negative for nausea, vomiting, abdominal pain, constipation, blood in stool and abdominal distention.  Genitourinary: Positive for frequency. Negative for dysuria, urgency, hematuria, flank pain, decreased urine volume and difficulty urinating.  Musculoskeletal: Negative for myalgias, back pain, joint swelling, arthralgias and gait problem.  Skin: Negative for color change, pallor and rash.  Neurological: Negative for dizziness, tremors, seizures, syncope, speech difficulty, weakness, light-headedness, numbness and headaches.  Hematological: Negative for adenopathy. Bruises/bleeds easily.  Psychiatric/Behavioral: Negative for confusion, sleep disturbance and agitation. The patient is nervous/anxious.        Objective:   Physical Exam  Filed Vitals:   07/23/12 1336  BP: 80/50  Pulse: 69  Temp: 97.8 F (36.6 C)  TempSrc: Oral  Height: 5\' 5"  (1.651 m)  Weight: 153 lb 12.8 oz (69.763 kg)  SpO2: 96%    Gen: Pleasant, well-nourished, elderly male  in no distress,  normal affect  ENT: No lesions,  mouth clear,  oropharynx clear, no postnasal drip  Neck: No JVD, no TMG, no carotid bruits  Lungs: No use of accessory muscles, no dullness to percussion, clear without rales or rhonchi  Cardiovascular: RRR, heart sounds normal, no murmur or gallops, no peripheral edema  Abdomen: soft and NT, no HSM,  BS normal  Musculoskeletal: No deformities, no cyanosis or  clubbing  Neuro: alert, non focal  Skin: Warm, no lesions or rashes   CT chest 03/10/2012 IMPRESSION:  1. New 3.0 x 2.3 cm mass in the right apex with spiculated margins  and pleural retraction. In addition, there is a new mass-like  lesion in the inferior right upper lobe with areas of internal  cavitation. Both of these lesions are nonspecific, but are  concerning. Differential considerations would include both  infection (particularly given the prior history of a large cavitary  lesion in the left apex demonstrated on prior chest CT 04/08/2001)  or neoplasm for both lesions.   10/23: CXR IMPRESSION:  2.6 x 5.1 cm cavitary lesion in the right mid lung, mildly  decreased.  2.9 x 3.1 cm cavitary lesion in the right lung apex, unchanged.        Assessment & Plan:   Lung abscess Right upper lobe and left upper lobe cavitary lesions stable to improved do to microaspiration and lung abscess Plan Observe off antibiotics at this time Return 6 months    Updated Medication List Outpatient Encounter Prescriptions as of 07/23/2012  Medication Sig Dispense Refill  . acetaminophen (TYLENOL ARTHRITIS PAIN) 650 MG CR tablet Take 650 mg by mouth every 8 (eight) hours as needed.        Marland Kitchen amLODipine (NORVASC) 10 MG tablet Take 10 mg by mouth daily.        . brimonidine (ALPHAGAN P) 0.1 % SOLN Twice daily to left eye and three times daily to right eye      . calcium-vitamin D (OSCAL 500/200 D-3) 500-200 MG-UNIT per tablet Take 1 tablet by mouth 2 (two) times daily.       . dorzolamide-timolol (COSOPT) 22.3-6.8 MG/ML ophthalmic solution Place 1 drop into both eyes 2 (two) times daily.       Marland Kitchen doxazosin (CARDURA) 8 MG tablet TAKE 1 TABLET BY MOUTH EVERY DAY  30 tablet  6  . famotidine (PEPCID) 40 MG tablet Take 1 tablet (40 mg total) by mouth at bedtime.  30 tablet  6  . furosemide (LASIX) 40 MG tablet Take 20 mg by mouth as needed.       . latanoprost (XALATAN) 0.005 % ophthalmic solution  Place 1 drop into both eyes at bedtime.      . MetroNIDAZOLE (FLAGYL PO) Take 1 tablet by mouth daily. Unsure of strength      . Multiple Vitamins-Minerals (OCUVITE ADULT FORMULA PO) Take by mouth daily.        . traMADol (ULTRAM) 50 MG tablet Take 50 mg by mouth 3 (three) times daily as needed.      . valACYclovir (VALTREX) 500 MG tablet Take 500 mg by mouth 3 (three) times daily.

## 2012-07-23 NOTE — Patient Instructions (Addendum)
No change in medications. Return in   3 months 

## 2012-07-24 NOTE — Assessment & Plan Note (Signed)
Right upper lobe and left upper lobe cavitary lesions stable to improved do to microaspiration and lung abscess Plan Observe off antibiotics at this time Return 6 months

## 2012-08-01 DIAGNOSIS — B0239 Other herpes zoster eye disease: Secondary | ICD-10-CM | POA: Diagnosis not present

## 2012-08-06 ENCOUNTER — Emergency Department: Payer: Self-pay | Admitting: Emergency Medicine

## 2012-08-06 DIAGNOSIS — R51 Headache: Secondary | ICD-10-CM | POA: Diagnosis not present

## 2012-08-06 DIAGNOSIS — Z043 Encounter for examination and observation following other accident: Secondary | ICD-10-CM | POA: Diagnosis not present

## 2012-08-06 DIAGNOSIS — B029 Zoster without complications: Secondary | ICD-10-CM | POA: Diagnosis not present

## 2012-08-06 DIAGNOSIS — Z79899 Other long term (current) drug therapy: Secondary | ICD-10-CM | POA: Diagnosis not present

## 2012-08-06 DIAGNOSIS — IMO0002 Reserved for concepts with insufficient information to code with codable children: Secondary | ICD-10-CM | POA: Diagnosis not present

## 2012-08-06 DIAGNOSIS — Z8673 Personal history of transient ischemic attack (TIA), and cerebral infarction without residual deficits: Secondary | ICD-10-CM | POA: Diagnosis not present

## 2012-08-08 DIAGNOSIS — B0239 Other herpes zoster eye disease: Secondary | ICD-10-CM | POA: Diagnosis not present

## 2012-08-08 DIAGNOSIS — H4011X0 Primary open-angle glaucoma, stage unspecified: Secondary | ICD-10-CM | POA: Diagnosis not present

## 2012-08-08 DIAGNOSIS — H409 Unspecified glaucoma: Secondary | ICD-10-CM | POA: Diagnosis not present

## 2012-08-29 DIAGNOSIS — H409 Unspecified glaucoma: Secondary | ICD-10-CM | POA: Diagnosis not present

## 2012-08-29 DIAGNOSIS — H4011X0 Primary open-angle glaucoma, stage unspecified: Secondary | ICD-10-CM | POA: Diagnosis not present

## 2012-10-02 ENCOUNTER — Ambulatory Visit: Payer: Medicare Other | Admitting: Critical Care Medicine

## 2012-10-07 ENCOUNTER — Other Ambulatory Visit: Payer: Self-pay | Admitting: Internal Medicine

## 2012-10-09 ENCOUNTER — Ambulatory Visit (HOSPITAL_BASED_OUTPATIENT_CLINIC_OR_DEPARTMENT_OTHER)
Admission: RE | Admit: 2012-10-09 | Discharge: 2012-10-09 | Disposition: A | Discharge status: Home / Self Care | Payer: Medicare Other | Source: Ambulatory Visit | Attending: Critical Care Medicine | Admitting: Critical Care Medicine

## 2012-10-09 ENCOUNTER — Ambulatory Visit (INDEPENDENT_AMBULATORY_CARE_PROVIDER_SITE_OTHER): Payer: Medicare Other | Admitting: Critical Care Medicine

## 2012-10-09 ENCOUNTER — Encounter: Payer: Self-pay | Admitting: Critical Care Medicine

## 2012-10-09 VITALS — BP 110/62 | HR 65 | Temp 97.8°F | Ht 65.0 in | Wt 152.0 lb

## 2012-10-09 DIAGNOSIS — J852 Abscess of lung without pneumonia: Secondary | ICD-10-CM

## 2012-10-09 NOTE — Progress Notes (Signed)
Subjective:    Patient ID: Alexander Goodwin, male    DOB: 05-Jan-1921, 79 y.o.   MRN: [id number]  HPI  79 y.o. 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 No dyspnea .   On PPI.  No real chest pain.  No f/c/s.  No QHS symptoms  Pt will rattle at night.  No change in swallow function. Pt denies any significant sore throat, nasal congestion or excess secretions, fever, chills, sweats, unintended weight loss, pleurtic or exertional chest pain, orthopnea PND, or leg swelling Pt denies any increase in rescue therapy over baseline, denies waking up needing it or having any early am or nocturnal exacerbations of coughing/wheezing/or dyspnea. Pt also denies any obvious fluctuation in symptoms with  weather or environmental change or other alleviating or aggravating factors   Past Medical History  Diagnosis Date  . Diverticulitis of colon   . History of colon polyps   . Arthritis     right knee, hips, neck. He denies any major limitation in activities  . GERD (gastroesophageal reflux disease)     well controlled with medication. No nocturnal symptoms on a regular basis  . Glaucoma(365)     both eyes, followed closely by Dr. Eulah Pont  . Esophagitis     Last EGD Fall '11   . Abscess of lung without pneumonia     Right upper lobe chronic lung abscess and prior left upper lobe lung abscess due to chronic aspiration  . Pancreatitis     resolved after GB surgery  . Stroke     TIAs - age 42  . BPH (benign prostatic hypertrophy) with urinary obstruction   . Hyperlipemia   . Hypertension   . Seasonal allergies   . Dysphagia, oropharyngeal      Family History  Problem Relation Age of Onset  . Diabetes Mother   . Hypertension Mother   . Heart disease Mother   . Stroke Father   . Heart disease Father   . Diabetes Father   . Cancer Sister     uterine cancer  . Cancer Brother   . COPD Brother   . Hypertension Brother   .  Cancer Brother   . Cancer Sister     gyn malignancy  . Cancer Sister     pancreatic cancer  . Allergies Sister   . Allergies Son   . Rheum arthritis Father   . Asthma Sister   . Asthma Son      History   Social History  . Marital Status: Married    Spouse Name: N/A    Number of Children: 3  . Years of Education: HSG   Occupational History  . 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 Smoking status: Former Smoker -- 0.50 packs/day for 30 years    Types: Pipe, Cigars    Quit date: 08/20/1978  . Smokeless tobacco: Former Neurosurgeon    Types: Chew    Quit date: 11/16/1990  . Alcohol Use: No  . Drug Use: No  . Sexually Active: Yes   Other Topics Concern  . Not on file   Social History Narrative   HSG.   Married 1948. 3 sons - '52, '57,'58- 5 grandchildren, one deceased at 64 - OD.  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 Marriage in good health. End  of Life Care -DNR/DNI,  No prolonged heroic or futile measures.      Allergies  Allergen Reactions  . Clindamycin/Lincomycin     itching     Outpatient Prescriptions Prior to Visit  Medication Sig Dispense Refill  . acetaminophen (TYLENOL ARTHRITIS PAIN) 650 MG CR tablet Take 650 mg by mouth every 8 (eight) hours as needed.        Marland Kitchen amLODipine (NORVASC) 10 MG tablet Take 10 mg by mouth daily.        . brimonidine (ALPHAGAN P) 0.1 % SOLN Twice daily to left eye and three times daily to right eye      . calcium-vitamin D (OSCAL 500/200 D-3) 500-200 MG-UNIT per tablet Take 1 tablet by mouth 2 (two) times daily.       . dorzolamide-timolol (COSOPT) 22.3-6.8 MG/ML ophthalmic solution Place 1 drop into both eyes 2 (two) times daily.       Marland Kitchen doxazosin (CARDURA) 8 MG tablet TAKE 1 TABLET BY MOUTH ONCE DAILY  30 tablet  2  . furosemide (LASIX) 40 MG tablet Take 20 mg by mouth as needed.       . latanoprost (XALATAN) 0.005 % ophthalmic solution Place 1 drop  into both eyes at bedtime.      . Multiple Vitamins-Minerals (OCUVITE ADULT FORMULA PO) Take by mouth daily.        . traMADol (ULTRAM) 50 MG tablet Take 50 mg by mouth 3 (three) times daily as needed.      . famotidine (PEPCID) 40 MG tablet Take 1 tablet (40 mg total) by mouth at bedtime.  30 tablet  6  . MetroNIDAZOLE (FLAGYL PO) Take 1 tablet by mouth daily. Unsure of strength      . valACYclovir (VALTREX) 500 MG tablet Take 500 mg by mouth 3 (three) times daily.       No facility-administered medications prior to visit.       Review of Systems  Constitutional: Positive for fatigue. Negative for fever, chills, diaphoresis, activity change, appetite change and unexpected weight change.  HENT: Positive for hearing loss, congestion, rhinorrhea, dental problem, voice change and postnasal drip. Negative for ear pain, nosebleeds, sore throat, facial swelling, sneezing, mouth sores, trouble swallowing, neck pain, neck stiffness, sinus pressure, tinnitus and ear discharge.   Eyes: Positive for discharge and itching. Negative for photophobia and visual disturbance.  Respiratory: Positive for cough and shortness of breath. Negative for apnea, choking, chest tightness, wheezing and stridor.   Cardiovascular: Negative for chest pain, palpitations and leg swelling.  Gastrointestinal: Negative for nausea, vomiting, abdominal pain, constipation, blood in stool and abdominal distention.  Genitourinary: Positive for frequency. Negative for dysuria, urgency, hematuria, flank pain, decreased urine volume and difficulty urinating.  Musculoskeletal: Negative for myalgias, back pain, joint swelling, arthralgias and gait problem.  Skin: Negative for color change, pallor and rash.  Neurological: Negative for dizziness, tremors, seizures, syncope, speech difficulty, weakness, light-headedness, numbness and headaches.  Hematological: Negative for adenopathy. Bruises/bleeds easily.  Psychiatric/Behavioral: Negative  for confusion, sleep disturbance and agitation. The patient is nervous/anxious.        Objective:   Physical Exam  Filed Vitals:   10/09/12 1620  BP: 110/62  Pulse: 65  Temp: 97.8 F (36.6 C)  TempSrc: Oral  Height: 5\' 5"  (1.651 m)  Weight: 152 lb (68.947 kg)  SpO2: 95%    Gen: Pleasant, well-nourished, elderly male  in no distress,  normal affect  ENT: No lesions,  mouth clear,  oropharynx  clear, no postnasal drip  Neck: No JVD, no TMG, no carotid bruits  Lungs: No use of accessory muscles, no dullness to percussion, scattered right upper lobe bronchi  Cardiovascular: RRR, heart sounds normal, no murmur or gallops, no peripheral edema  Abdomen: soft and NT, no HSM,  BS normal  Musculoskeletal: No deformities, no cyanosis or clubbing  Neuro: alert, non focal  Skin: Warm, no lesions or rashes   Dg Chest 2 View  10/09/2012  *RADIOLOGY REPORT*  Clinical Data: Lung abscess.  CHEST - 2 VIEW  Comparison: 06/11/2012  Findings: There are persistent abscesses in the right lung apex and in the right midzone adjacent to the minor fissure.  The left lung is clear.  Heart size and vascularity are normal. Old compression fractures in the upper thoracic spine.  No effusions.  IMPRESSION: Persistent abscesses in the right upper lobe with slightly thicker walls than on the prior study.  No other change.   Original Report Authenticated By: Francene Boyers, M.D.       Assessment & Plan:   Lung abscess Right upper lobe lung abscesses x2 unchanged with thickening cavitation wall. Mild hemoptysis associated. Given patient's age and comorbidities the patient is not a surgical candidate for lung resection. The patient also has history of recurrent Clostridium difficile toxin colitis therefore chronic antibiotics are not an option  Plan Continued observation and palliation    Updated Medication List Outpatient Encounter Prescriptions as of 10/09/2012  Medication Sig Dispense Refill  .  acetaminophen (TYLENOL ARTHRITIS PAIN) 650 MG CR tablet Take 650 mg by mouth every 8 (eight) hours as needed.        Marland Kitchen amLODipine (NORVASC) 10 MG tablet Take 10 mg by mouth daily.        . brimonidine (ALPHAGAN P) 0.1 % SOLN Twice daily to left eye and three times daily to right eye      . calcium-vitamin D (OSCAL 500/200 D-3) 500-200 MG-UNIT per tablet Take 1 tablet by mouth 2 (two) times daily.       . dorzolamide-timolol (COSOPT) 22.3-6.8 MG/ML ophthalmic solution Place 1 drop into both eyes 2 (two) times daily.       Marland Kitchen doxazosin (CARDURA) 8 MG tablet TAKE 1 TABLET BY MOUTH ONCE DAILY  30 tablet  2  . furosemide (LASIX) 40 MG tablet Take 20 mg by mouth as needed.       . gabapentin (NEURONTIN) 300 MG capsule Take 300 mg by mouth 2 (two) times daily.      Marland Kitchen latanoprost (XALATAN) 0.005 % ophthalmic solution Place 1 drop into both eyes at bedtime.      . Multiple Vitamins-Minerals (OCUVITE ADULT FORMULA PO) Take by mouth daily.        Marland Kitchen omeprazole (PRILOSEC) 20 MG capsule Take 20 mg by mouth daily.      . traMADol (ULTRAM) 50 MG tablet Take 50 mg by mouth 3 (three) times daily as needed.      . [DISCONTINUED] famotidine (PEPCID) 40 MG tablet Take 1 tablet (40 mg total) by mouth at bedtime.  30 tablet  6  . [DISCONTINUED] MetroNIDAZOLE (FLAGYL PO) Take 1 tablet by mouth daily. Unsure of strength      . [DISCONTINUED] valACYclovir (VALTREX) 500 MG tablet Take 500 mg by mouth 3 (three) times daily.       No facility-administered encounter medications on file as of 10/09/2012.

## 2012-10-09 NOTE — Patient Instructions (Addendum)
Chest xray today No change in medications Return 4 months 

## 2012-10-10 ENCOUNTER — Encounter: Payer: Self-pay | Admitting: Critical Care Medicine

## 2012-10-10 NOTE — Assessment & Plan Note (Signed)
Right upper lobe lung abscesses x2 unchanged with thickening cavitation wall. Mild hemoptysis associated. Given patient's age and comorbidities the patient is not a surgical candidate for lung resection. The patient also has history of recurrent Clostridium difficile toxin colitis therefore chronic antibiotics are not an option  Plan Continued observation and palliation

## 2012-10-10 NOTE — Progress Notes (Signed)
Quick Note:  Notify the patient that the Xray shows he still has two stable lung cavities/abscesses in R upper lung, but no pus in the cavities, and they show signs of healing. No indication for further ABX ______

## 2012-10-10 NOTE — Progress Notes (Signed)
Quick Note:  Called, spoke with pt. Informed him of cxr results and recs per Dr. Wright. He verbalized understanding of this and voiced no further questions or concerns at this time. ______ 

## 2012-11-24 DIAGNOSIS — H4011X0 Primary open-angle glaucoma, stage unspecified: Secondary | ICD-10-CM | POA: Diagnosis not present

## 2012-11-24 DIAGNOSIS — B029 Zoster without complications: Secondary | ICD-10-CM | POA: Diagnosis not present

## 2012-11-24 DIAGNOSIS — H409 Unspecified glaucoma: Secondary | ICD-10-CM | POA: Diagnosis not present

## 2012-12-15 DIAGNOSIS — R5383 Other fatigue: Secondary | ICD-10-CM | POA: Diagnosis not present

## 2012-12-15 DIAGNOSIS — D649 Anemia, unspecified: Secondary | ICD-10-CM | POA: Diagnosis not present

## 2012-12-15 DIAGNOSIS — I498 Other specified cardiac arrhythmias: Secondary | ICD-10-CM | POA: Diagnosis not present

## 2012-12-15 DIAGNOSIS — I10 Essential (primary) hypertension: Secondary | ICD-10-CM | POA: Diagnosis not present

## 2012-12-22 DIAGNOSIS — R5381 Other malaise: Secondary | ICD-10-CM | POA: Diagnosis not present

## 2012-12-22 DIAGNOSIS — D649 Anemia, unspecified: Secondary | ICD-10-CM | POA: Diagnosis not present

## 2012-12-22 DIAGNOSIS — I10 Essential (primary) hypertension: Secondary | ICD-10-CM | POA: Diagnosis not present

## 2012-12-22 DIAGNOSIS — I498 Other specified cardiac arrhythmias: Secondary | ICD-10-CM | POA: Diagnosis not present

## 2012-12-22 DIAGNOSIS — R5383 Other fatigue: Secondary | ICD-10-CM | POA: Diagnosis not present

## 2012-12-31 DIAGNOSIS — H4011X0 Primary open-angle glaucoma, stage unspecified: Secondary | ICD-10-CM | POA: Diagnosis not present

## 2012-12-31 DIAGNOSIS — H409 Unspecified glaucoma: Secondary | ICD-10-CM | POA: Diagnosis not present

## 2013-01-13 ENCOUNTER — Other Ambulatory Visit: Payer: Self-pay | Admitting: Internal Medicine

## 2013-01-20 ENCOUNTER — Other Ambulatory Visit: Payer: Self-pay | Admitting: Internal Medicine

## 2013-01-21 DIAGNOSIS — L570 Actinic keratosis: Secondary | ICD-10-CM | POA: Diagnosis not present

## 2013-01-21 DIAGNOSIS — D235 Other benign neoplasm of skin of trunk: Secondary | ICD-10-CM | POA: Diagnosis not present

## 2013-01-21 DIAGNOSIS — IMO0002 Reserved for concepts with insufficient information to code with codable children: Secondary | ICD-10-CM | POA: Diagnosis not present

## 2013-01-21 DIAGNOSIS — L988 Other specified disorders of the skin and subcutaneous tissue: Secondary | ICD-10-CM | POA: Diagnosis not present

## 2013-01-24 DIAGNOSIS — J189 Pneumonia, unspecified organism: Secondary | ICD-10-CM | POA: Diagnosis not present

## 2013-01-24 DIAGNOSIS — R509 Fever, unspecified: Secondary | ICD-10-CM | POA: Diagnosis not present

## 2013-02-02 IMAGING — CR DG CHEST 2V
2 series · 2 of 2 positions shown · non-contrast
Comparison: 05/08/2012

CLINICAL DATA: Hemoptysis, lung cavitation

CHEST - 2 VIEW

[view not recorded (1 of 2)]
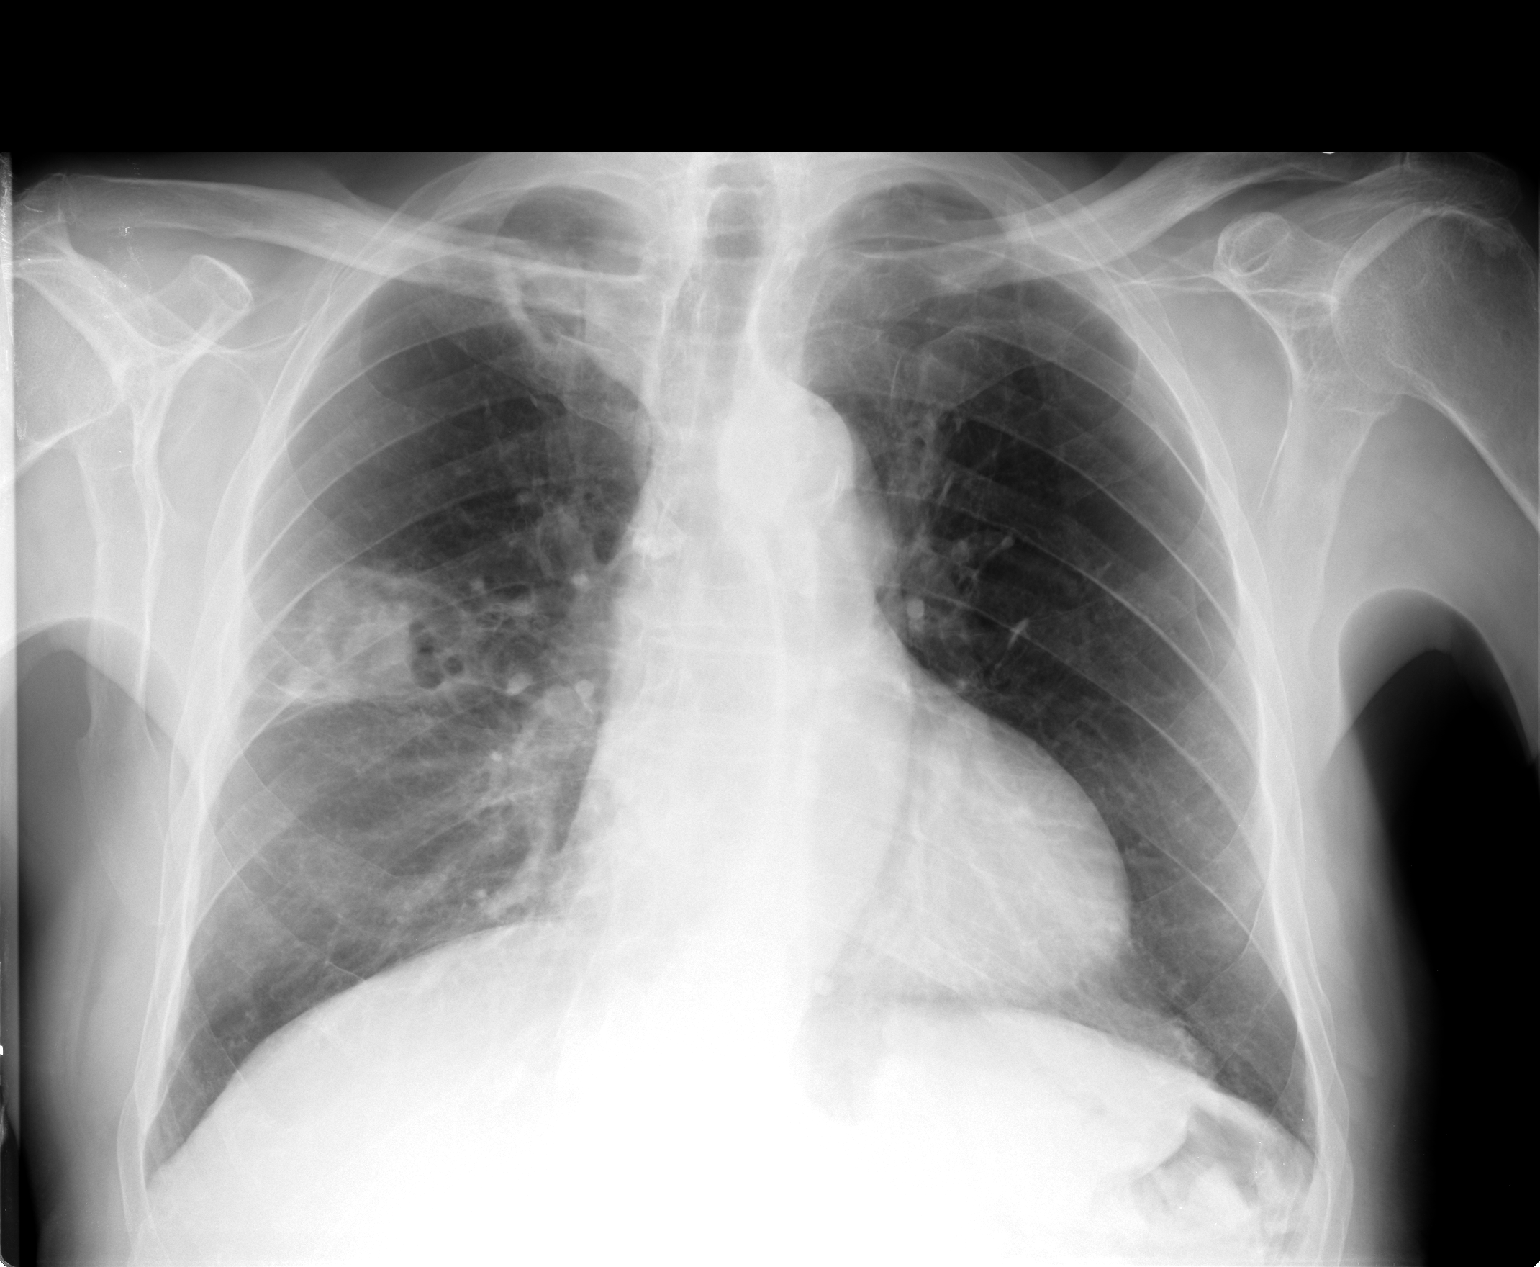

[view not recorded (2 of 2)]
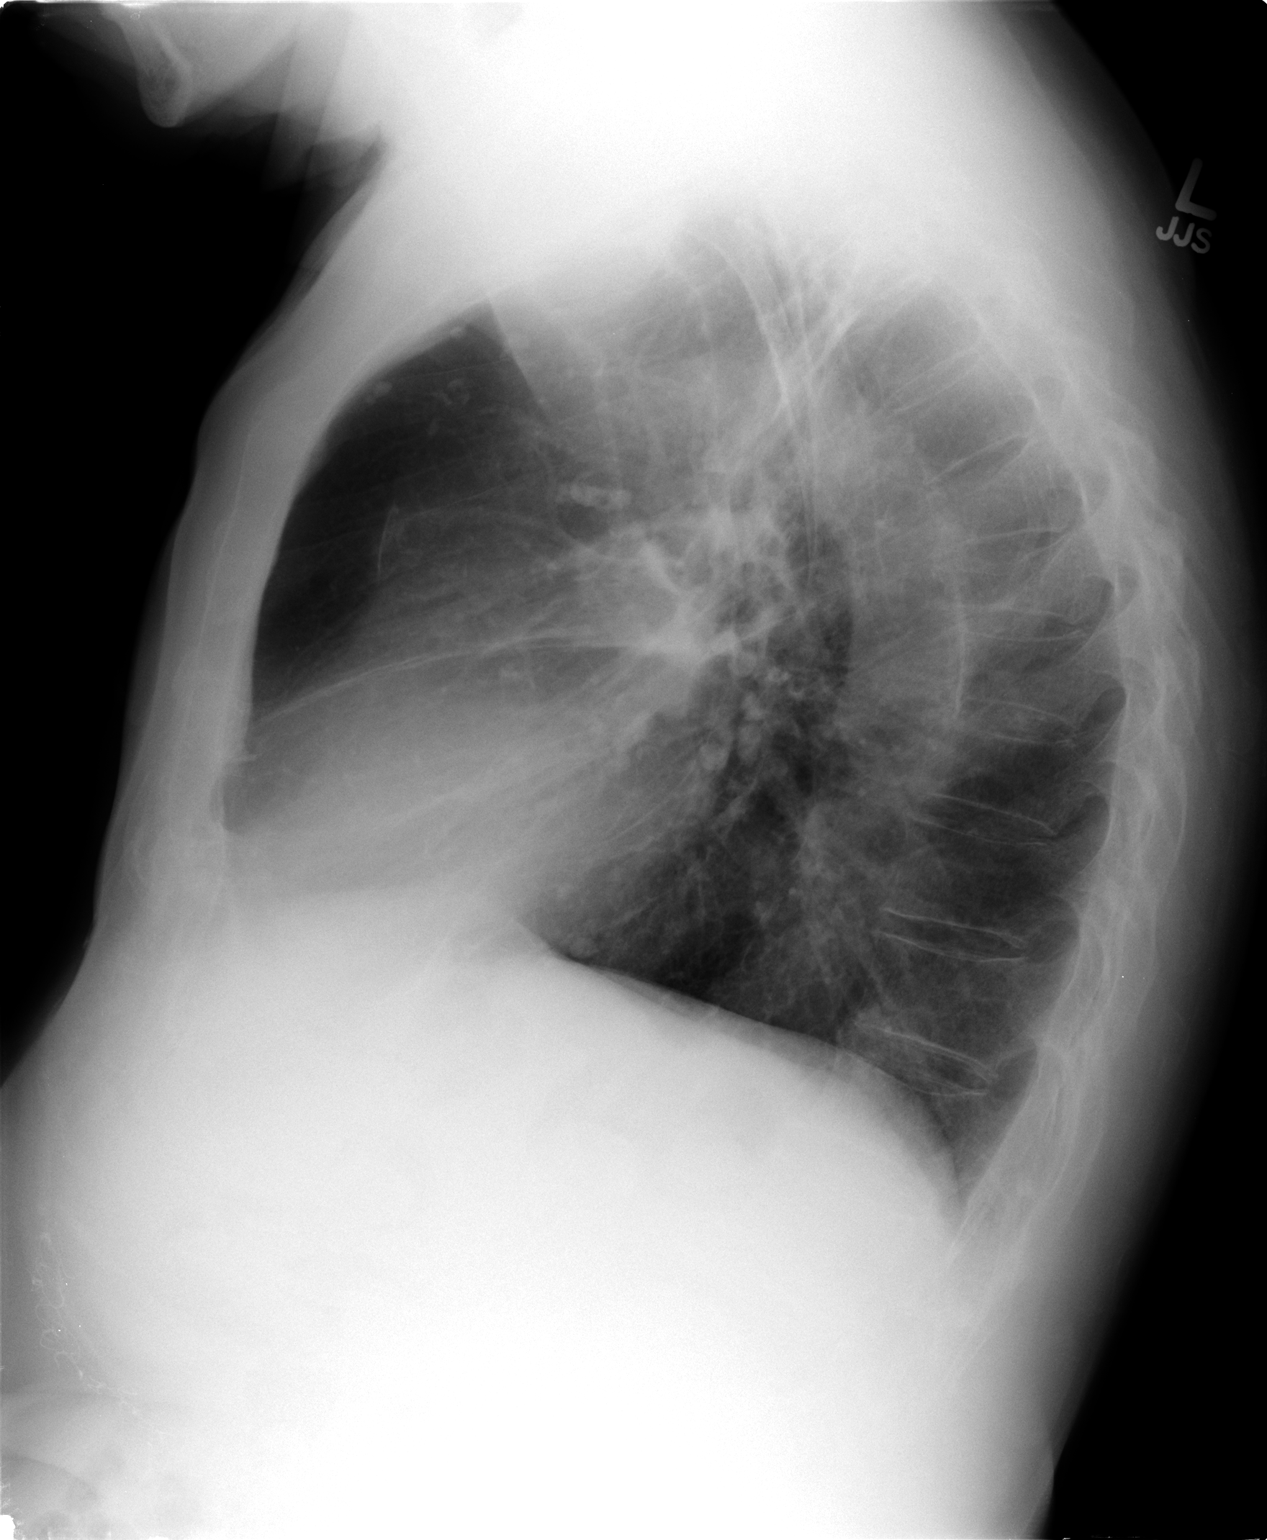

[2 of 2 positions shown; findings below may reference images not displayed]

FINDINGS: Cavitary lesion in the right mid lung measures 3.6 x
cm, mildly decreased.

Cavitary lesion in the right lung apex measures 2.9 x 3.1 cm,
unchanged.

No pleural effusion or pneumothorax.

Cardiomediastinal silhouette is within normal limits.

Mild to moderate compression deformities of two mid thoracic
vertebral bodies, unchanged.
IMPRESSION: 2.6 x 5.1 cm cavitary lesion in the right mid lung, mildly
decreased.

2.9 x 3.1 cm cavitary lesion in the right lung apex, unchanged.

## 2013-02-03 ENCOUNTER — Ambulatory Visit (INDEPENDENT_AMBULATORY_CARE_PROVIDER_SITE_OTHER): Payer: Medicare Other | Admitting: Critical Care Medicine

## 2013-02-03 ENCOUNTER — Encounter: Payer: Self-pay | Admitting: Critical Care Medicine

## 2013-02-03 ENCOUNTER — Ambulatory Visit (INDEPENDENT_AMBULATORY_CARE_PROVIDER_SITE_OTHER)
Admission: RE | Admit: 2013-02-03 | Discharge: 2013-02-03 | Disposition: A | Discharge status: Home / Self Care | Payer: Medicare Other | Source: Ambulatory Visit | Attending: Critical Care Medicine | Admitting: Critical Care Medicine

## 2013-02-03 VITALS — BP 120/62 | HR 52 | Temp 98.1°F | Ht 65.0 in | Wt 145.0 lb

## 2013-02-03 DIAGNOSIS — J852 Abscess of lung without pneumonia: Secondary | ICD-10-CM | POA: Diagnosis not present

## 2013-02-03 DIAGNOSIS — J438 Other emphysema: Secondary | ICD-10-CM | POA: Diagnosis not present

## 2013-02-03 NOTE — Patient Instructions (Addendum)
No change in medications. Chest xray today Finish current antibiotics Return 4 months

## 2013-02-03 NOTE — Progress Notes (Signed)
Subjective:    Patient ID: Alexander Goodwin, male    DOB: 1920-10-22, 79 y.o.   MRN: [id number]  HPI  79 y.o. 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 Follow-up    Breathing is unchanged. Reports cough, worse at night. Denies chest pain, chest tightness, SOB or wheezing.   Cough about the same, worse at night. Low grade fever 100.3, not as high since.  Pt rx tylenol and the temp reduced. Pt went to Urgent Care and Rx levaquin 500/d x 7days  for ?lung infection about one week ago.   Past Medical History  Diagnosis Date  . Diverticulitis of colon   . History of colon polyps   . Arthritis     right knee, hips, neck. He denies any major limitation in activities  . GERD (gastroesophageal reflux disease)     well controlled with medication. No nocturnal symptoms on a regular basis  . Glaucoma     both eyes, followed closely by Dr. Eulah Pont  . Esophagitis     Last EGD Fall '11   . Abscess of lung without pneumonia     Right upper lobe chronic lung abscess and prior left upper lobe lung abscess due to chronic aspiration  . Pancreatitis     resolved after GB surgery  . Stroke     TIAs - age 68  . BPH (benign prostatic hypertrophy) with urinary obstruction   . Hyperlipemia   . Hypertension   . Seasonal allergies   . Dysphagia, oropharyngeal      Family History  Problem Relation Age of Onset  . Diabetes Mother   . Hypertension Mother   . Heart disease Mother   . Stroke Father   . Heart disease Father   . Diabetes Father   . Cancer Sister     uterine cancer  . Cancer Brother   . COPD Brother   . Hypertension Brother   . Cancer Brother   . Cancer Sister     gyn malignancy  . Cancer Sister     pancreatic cancer  . Allergies Sister   . Allergies Son   . Rheum arthritis Father   . Asthma Sister   . Asthma Son      History   Social History  . Marital Status: Married    Spouse Name: N/A    Number of Children: 3  . Years of Education: HSG   Occupational History  . 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 Smoking status: Former Smoker -- 0.50 packs/day for 30 years    Types: Pipe, Cigars    Quit date: 08/20/1978  . Smokeless tobacco: Former Neurosurgeon    Types: Chew    Quit date: 11/16/1990  . Alcohol Use: No  . Drug Use: No  . Sexually Active: Yes   Other Topics Concern  . Not on file   Social History Narrative   HSG.   Married 1948. 3 sons - '52, '57,'58- 5 grandchildren, one deceased at 26 - OD.  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 Marriage in good health. End of Life Care -DNR/DNI,  No prolonged heroic or futile measures.      Allergies  Allergen Reactions  . Clindamycin/Lincomycin     itching     Outpatient Prescriptions Prior to Visit  Medication  Sig Dispense Refill  . acetaminophen (TYLENOL ARTHRITIS PAIN) 650 MG CR tablet Take 650 mg by mouth every 8 (eight) hours as needed.        Marland Kitchen amLODipine (NORVASC) 10 MG tablet Take 10 mg by mouth daily.        . brimonidine (ALPHAGAN P) 0.1 % SOLN Twice daily to left eye and three times daily to right eye      . calcium-vitamin D (OSCAL 500/200 D-3) 500-200 MG-UNIT per tablet Take 1 tablet by mouth 2 (two) times daily.       . dorzolamide-timolol (COSOPT) 22.3-6.8 MG/ML ophthalmic solution Place 1 drop into both eyes 2 (two) times daily.       Marland Kitchen doxazosin (CARDURA) 8 MG tablet TAKE 1 TABLET BY MOUTH ONCE DAILY  30 tablet  2  . furosemide (LASIX) 40 MG tablet TAKE 1 TABLET BY MOUTH ONCE DAILY  30 tablet  0  . gabapentin (NEURONTIN) 300 MG capsule Take 300 mg by mouth 2 (two) times daily.      Marland Kitchen latanoprost (XALATAN) 0.005 % ophthalmic solution Place 1 drop into both eyes at bedtime.      . Multiple Vitamins-Minerals (OCUVITE ADULT FORMULA PO) Take by mouth daily.        Marland Kitchen omeprazole (PRILOSEC) 20 MG capsule Take 20 mg by mouth daily.       . traMADol (ULTRAM) 50 MG tablet Take 50 mg by mouth 3 (three) times daily as needed.       No facility-administered medications prior to visit.       Review of Systems  Constitutional: Positive for fatigue. Negative for fever, chills, diaphoresis, activity change, appetite change and unexpected weight change.  HENT: Positive for hearing loss, congestion, rhinorrhea, dental problem, voice change and postnasal drip. Negative for ear pain, nosebleeds, sore throat, facial swelling, sneezing, mouth sores, trouble swallowing, neck pain, neck stiffness, sinus pressure, tinnitus and ear discharge.   Eyes: Positive for discharge and itching. Negative for photophobia and visual disturbance.  Respiratory: Positive for cough and shortness of breath. Negative for apnea, choking, chest tightness, wheezing and stridor.   Cardiovascular: Negative for chest pain, palpitations and leg swelling.  Gastrointestinal: Negative for nausea, vomiting, abdominal pain, constipation, blood in stool and abdominal distention.  Genitourinary: Positive for frequency. Negative for dysuria, urgency, hematuria, flank pain, decreased urine volume and difficulty urinating.  Musculoskeletal: Negative for myalgias, back pain, joint swelling, arthralgias and gait problem.  Skin: Negative for color change, pallor and rash.  Neurological: Negative for dizziness, tremors, seizures, syncope, speech difficulty, weakness, light-headedness, numbness and headaches.  Hematological: Negative for adenopathy. Bruises/bleeds easily.  Psychiatric/Behavioral: Negative for confusion, sleep disturbance and agitation. The patient is nervous/anxious.        Objective:   Physical Exam  Filed Vitals:   02/03/13 1557  BP: 120/62  Pulse: 52  Temp: 98.1 F (36.7 C)  TempSrc: Oral  Height: 5\' 5"  (1.651 m)  Weight: 145 lb (65.772 kg)  SpO2: 97%    Gen: Pleasant, well-nourished, elderly male  in no distress,  normal affect  ENT: No  lesions,  mouth clear,  oropharynx clear, no postnasal drip  Neck: No JVD, no TMG, no carotid bruits  Lungs: No use of accessory muscles, no dullness to percussion, scattered right upper lobe bronchi  Cardiovascular: RRR, heart sounds normal, no murmur or gallops, no peripheral edema  Abdomen: soft and NT, no HSM,  BS normal  Musculoskeletal: No deformities, no cyanosis or clubbing  Neuro:  alert, non focal  Skin: Warm, no lesions or rashes   Dg Chest 2 View  02/03/2013   *RADIOLOGY REPORT*  Clinical Data: Lung abscess, cough, shortness of breath, chest pain, former smoker, history hypertension  CHEST - 2 VIEW  Comparison: 10/09/2012  Findings: Upper normal heart size. Calcified tortuous aorta. Pulmonary vascularity normal. Area of persistent opacity in mid right lung just above minor fissure. Cavitation seen within this site on the previous exam is no longer visualized. Overall the area of opacity is perhaps minimally decreased in size.  Old granulomatous disease. Underlying emphysematous changes. No new areas of infiltrate or cavitation. No pleural effusion or pneumothorax. Bones demineralized with superior endplate compression deformities of three adjacent mid thoracic vertebrae unchanged.  IMPRESSION: Resolution of cavitation seen at the right upper lobe adjacent to the minor fissure, though overall area of persistent parenchymal opacity remains, perhaps slightly decreased in size since previous exam. Underlying emphysematous and old granulomatous disease changes.   Original Report Authenticated By: Ulyses Southward, M.D.      Assessment & Plan:   Lung abscess Right upper lobe lung abscess improved.  Due to chronic aspiration  The pt is a non surgical candidate due to age  Recent flare now improved with antibiotics Plan  Finish current course levaquin     Updated Medication List Outpatient Encounter Prescriptions as of 02/03/2013  Medication Sig Dispense Refill  . acetaminophen  (TYLENOL ARTHRITIS PAIN) 650 MG CR tablet Take 650 mg by mouth every 8 (eight) hours as needed.        Marland Kitchen amLODipine (NORVASC) 10 MG tablet Take 10 mg by mouth daily.        . brimonidine (ALPHAGAN P) 0.1 % SOLN Twice daily to left eye and three times daily to right eye      . calcium-vitamin D (OSCAL 500/200 D-3) 500-200 MG-UNIT per tablet Take 1 tablet by mouth 2 (two) times daily.       . dorzolamide-timolol (COSOPT) 22.3-6.8 MG/ML ophthalmic solution Place 1 drop into both eyes 2 (two) times daily.       Marland Kitchen doxazosin (CARDURA) 8 MG tablet TAKE 1 TABLET BY MOUTH ONCE DAILY  30 tablet  2  . furosemide (LASIX) 40 MG tablet TAKE 1 TABLET BY MOUTH ONCE DAILY  30 tablet  0  . gabapentin (NEURONTIN) 300 MG capsule Take 300 mg by mouth 2 (two) times daily.      Marland Kitchen latanoprost (XALATAN) 0.005 % ophthalmic solution Place 1 drop into both eyes at bedtime.      Marland Kitchen levofloxacin (LEVAQUIN) 500 MG tablet Take 1 tablet by mouth daily.      . Multiple Vitamins-Minerals (OCUVITE ADULT FORMULA PO) Take by mouth daily.        Marland Kitchen omeprazole (PRILOSEC) 20 MG capsule Take 20 mg by mouth daily.      . traMADol (ULTRAM) 50 MG tablet Take 50 mg by mouth 3 (three) times daily as needed.       No facility-administered encounter medications on file as of 02/03/2013.

## 2013-02-04 NOTE — Assessment & Plan Note (Signed)
Right upper lobe lung abscess improved.  Due to chronic aspiration  The pt is a non surgical candidate due to age  Recent flare now improved with antibiotics Plan  Alexander Goodwin current course levaquin

## 2013-02-04 NOTE — Progress Notes (Signed)
Quick Note:  Notify the patient that the Xray is stable and no pneumonia, lung abscesses are better and resolving No change in medications are recommended. Continue current meds as prescribed at last office visit ______

## 2013-02-05 ENCOUNTER — Telehealth: Payer: Self-pay | Admitting: Critical Care Medicine

## 2013-02-05 NOTE — Telephone Encounter (Signed)
Notes Recorded by Storm Frisk, MD on 02/04/2013 at 2:28 PM Notify the patient that the Xray is stable and no pneumonia, lung abscesses are better and resolving No change in medications are recommended. Continue current meds as prescribed at last office visit   lmtcb x1 w/ family member

## 2013-02-05 NOTE — Telephone Encounter (Signed)
I spoke with patient about results and he verbalized understanding and had no questions 

## 2013-02-05 NOTE — Progress Notes (Signed)
Quick Note:  Called, spoke with family member. Was advised pt isn't available right now. She will have him call office back. ______

## 2013-02-06 ENCOUNTER — Emergency Department: Payer: Self-pay | Admitting: Emergency Medicine

## 2013-02-06 DIAGNOSIS — R6889 Other general symptoms and signs: Secondary | ICD-10-CM | POA: Diagnosis not present

## 2013-02-06 DIAGNOSIS — R42 Dizziness and giddiness: Secondary | ICD-10-CM | POA: Diagnosis not present

## 2013-02-06 DIAGNOSIS — Z79899 Other long term (current) drug therapy: Secondary | ICD-10-CM | POA: Diagnosis not present

## 2013-02-06 LAB — URINALYSIS, COMPLETE
Bacteria: NONE SEEN
Blood: NEGATIVE
Ketone: NEGATIVE
Nitrite: NEGATIVE
Protein: NEGATIVE
RBC,UR: <1 /HPF (ref 0–5)
Specific Gravity: 1.012 (ref 1.003–1.030)
WBC UR: <1 /HPF (ref 0–5)

## 2013-02-06 LAB — BASIC METABOLIC PANEL
BUN: 23 mg/dL — ABNORMAL HIGH (ref 7–18)
Calcium, Total: 9.6 mg/dL (ref 8.5–10.1)
Chloride: 108 mmol/L — ABNORMAL HIGH (ref 98–107)
EGFR (African American): 55 — ABNORMAL LOW
EGFR (Non-African Amer.): 48 — ABNORMAL LOW
Osmolality: 285 (ref 275–301)

## 2013-02-06 LAB — CBC
MCH: 30.4 pg (ref 26.0–34.0)
MCHC: 32.5 g/dL (ref 32.0–36.0)
Platelet: 251 10*3/uL (ref 150–440)
RDW: 18.2 % — ABNORMAL HIGH (ref 11.5–14.5)

## 2013-02-25 DIAGNOSIS — B028 Zoster with other complications: Secondary | ICD-10-CM | POA: Diagnosis not present

## 2013-02-25 DIAGNOSIS — L570 Actinic keratosis: Secondary | ICD-10-CM | POA: Diagnosis not present

## 2013-02-25 DIAGNOSIS — D0439 Carcinoma in situ of skin of other parts of face: Secondary | ICD-10-CM | POA: Diagnosis not present

## 2013-03-17 ENCOUNTER — Other Ambulatory Visit: Payer: Self-pay | Admitting: Internal Medicine

## 2013-03-24 ENCOUNTER — Other Ambulatory Visit: Payer: Self-pay | Admitting: Internal Medicine

## 2013-04-10 DIAGNOSIS — H2640 Unspecified secondary cataract: Secondary | ICD-10-CM | POA: Diagnosis not present

## 2013-04-10 DIAGNOSIS — Z961 Presence of intraocular lens: Secondary | ICD-10-CM | POA: Insufficient documentation

## 2013-04-10 DIAGNOSIS — H26499 Other secondary cataract, unspecified eye: Secondary | ICD-10-CM | POA: Insufficient documentation

## 2013-05-15 DIAGNOSIS — Z8619 Personal history of other infectious and parasitic diseases: Secondary | ICD-10-CM | POA: Insufficient documentation

## 2013-06-09 ENCOUNTER — Encounter: Payer: Self-pay | Admitting: Critical Care Medicine

## 2013-06-09 ENCOUNTER — Ambulatory Visit (INDEPENDENT_AMBULATORY_CARE_PROVIDER_SITE_OTHER): Payer: Medicare Other | Admitting: Critical Care Medicine

## 2013-06-09 VITALS — BP 142/70 | HR 46 | Temp 98.0°F | Ht 64.0 in | Wt 152.4 lb

## 2013-06-09 DIAGNOSIS — J852 Abscess of lung without pneumonia: Secondary | ICD-10-CM | POA: Diagnosis not present

## 2013-06-09 NOTE — Assessment & Plan Note (Addendum)
Rul lung asbcess now improved  Plan observation

## 2013-06-09 NOTE — Progress Notes (Signed)
Subjective:    Patient ID: Alexander Goodwin, male    DOB: April 28, 1921, 79 y.o.   MRN: [id number]  HPI  79 y.o. 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 Follow-up    Pt c/o sometimes prod cough with clear mucous.  Pt also has scratchy throat when laying down.  No SOB, tightness in chest.  No congestion.  No new issues. Min cough Pt denies any significant sore throat, nasal congestion or excess secretions, fever, chills, sweats, unintended weight loss, pleurtic or exertional chest pain, orthopnea PND, or leg swelling Pt denies any increase in rescue therapy over baseline, denies waking up needing it or having any early am or nocturnal exacerbations of coughing/wheezing/or dyspnea. Pt also denies any obvious fluctuation in symptoms with  weather or environmental change or other alleviating or aggravating factors    Past Medical History  Diagnosis Date  . Diverticulitis of colon   . History of colon polyps   . Arthritis     right knee, hips, neck. He denies any major limitation in activities  . GERD (gastroesophageal reflux disease)     well controlled with medication. No nocturnal symptoms on a regular basis  . Glaucoma     both eyes, followed closely by Dr. Eulah Pont  . Esophagitis     Last EGD Fall '11   . Abscess of lung without pneumonia     Right upper lobe chronic lung abscess and prior left upper lobe lung abscess due to chronic aspiration  . Pancreatitis     resolved after GB surgery  . Stroke     TIAs - age 24  . BPH (benign prostatic hypertrophy) with urinary obstruction   . Hyperlipemia   . Hypertension   . Seasonal allergies   . Dysphagia, oropharyngeal      Family History  Problem Relation Age of Onset  . Diabetes Mother   . Hypertension Mother   . Heart disease Mother   . Stroke Father   . Heart disease Father   . Diabetes Father   . Cancer Sister     uterine cancer  . Cancer  Brother   . COPD Brother   . Hypertension Brother   . Cancer Brother   . Cancer Sister     gyn malignancy  . Cancer Sister     pancreatic cancer  . Allergies Sister   . Allergies Son   . Rheum arthritis Father   . Asthma Sister   . Asthma Son      History   Social History  . Marital Status: Married    Spouse Name: N/A    Number of Children: 3  . Years of Education: HSG   Occupational History  . 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 Smoking status: Former Smoker -- 0.50 packs/day for 30 years    Types: Pipe, Cigars    Quit date: 08/20/1978  . Smokeless tobacco: Former Neurosurgeon    Types: Chew    Quit date: 11/16/1990  . Alcohol Use: No  . Drug Use: No  . Sexual Activity: Yes   Other Topics Concern  . Not on file   Social History Narrative   HSG.   Married 1948. 3 sons - '52, '57,'58- 5 grandchildren, one deceased at 64 - OD.  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 Marriage in good health. End of Life Care -DNR/DNI,  No prolonged heroic or futile measures.      Allergies  Allergen Reactions  . Clindamycin/Lincomycin     itching     Outpatient Prescriptions Prior to Visit  Medication Sig Dispense Refill  . acetaminophen (TYLENOL ARTHRITIS PAIN) 650 MG CR tablet Take 650 mg by mouth every 8 (eight) hours as needed.        Marland Kitchen amLODipine (NORVASC) 10 MG tablet Take 10 mg by mouth daily.        . brimonidine (ALPHAGAN P) 0.1 % SOLN Twice daily to left eye and three times daily to right eye      . calcium-vitamin D (OSCAL 500/200 D-3) 500-200 MG-UNIT per tablet Take 1 tablet by mouth 2 (two) times daily.       . dorzolamide-timolol (COSOPT) 22.3-6.8 MG/ML ophthalmic solution Place 1 drop into both eyes 2 (two) times daily.       Marland Kitchen doxazosin (CARDURA) 8 MG tablet TAKE 1 TABLET BY MOUTH ONCE DAILY  30 tablet  2  . furosemide (LASIX) 40 MG tablet TAKE 1 TABLET BY MOUTH ONCE DAILY  30 tablet   0  . latanoprost (XALATAN) 0.005 % ophthalmic solution Place 1 drop into both eyes at bedtime.      Marland Kitchen omeprazole (PRILOSEC) 20 MG capsule Take 20 mg by mouth daily.      . traMADol (ULTRAM) 50 MG tablet Take 50 mg by mouth 3 (three) times daily as needed.      Marland Kitchen levofloxacin (LEVAQUIN) 500 MG tablet Take 1 tablet by mouth daily.      Marland Kitchen gabapentin (NEURONTIN) 300 MG capsule Take 300 mg by mouth 2 (two) times daily.      . Multiple Vitamins-Minerals (OCUVITE ADULT FORMULA PO) Take by mouth daily.         No facility-administered medications prior to visit.       Review of Systems  Constitutional: Positive for fatigue. Negative for fever, chills, diaphoresis, activity change, appetite change and unexpected weight change.  HENT: Positive for congestion, dental problem, hearing loss, postnasal drip, rhinorrhea and voice change. Negative for ear discharge, ear pain, facial swelling, mouth sores, nosebleeds, sinus pressure, sneezing, sore throat, tinnitus and trouble swallowing.   Eyes: Positive for discharge and itching. Negative for photophobia and visual disturbance.  Respiratory: Positive for cough and shortness of breath. Negative for apnea, choking, chest tightness, wheezing and stridor.   Cardiovascular: Negative for chest pain, palpitations and leg swelling.  Gastrointestinal: Negative for nausea, vomiting, abdominal pain, constipation, blood in stool and abdominal distention.  Genitourinary: Positive for frequency. Negative for dysuria, urgency, hematuria, flank pain, decreased urine volume and difficulty urinating.  Musculoskeletal: Negative for arthralgias, back pain, gait problem, joint swelling, myalgias, neck pain and neck stiffness.  Skin: Negative for color change, pallor and rash.  Neurological: Negative for dizziness, tremors, seizures, syncope, speech difficulty, weakness, light-headedness, numbness and headaches.  Hematological: Negative for adenopathy. Bruises/bleeds easily.   Psychiatric/Behavioral: Negative for confusion, sleep disturbance and agitation. The patient is nervous/anxious.        Objective:   Physical Exam  Filed Vitals:   06/09/13 1526  BP: 142/70  Pulse: 46  Temp: 98 F (36.7 C)  TempSrc: Oral  Height: 5\' 4"  (1.626 m)  Weight: 152 lb 6.4 oz (69.128 kg)  SpO2: 97%    Gen: Pleasant, well-nourished, elderly male  in no distress,  normal affect  ENT: No lesions,  mouth  clear,  oropharynx clear, no postnasal drip  Neck: No JVD, no TMG, no carotid bruits  Lungs: No use of accessory muscles, no dullness to percussion, clear  Cardiovascular: RRR, heart sounds normal, no murmur or gallops, no peripheral edema  Abdomen: soft and NT, no HSM,  BS normal  Musculoskeletal: No deformities, no cyanosis or clubbing  Neuro: alert, non focal  Skin: Warm, no lesions or rashes   No results found.    Assessment & Plan:   Lung abscess Rul lung asbcess now improved  Plan observation    Updated Medication List Outpatient Encounter Prescriptions as of 06/09/2013  Medication Sig Dispense Refill  . acetaminophen (TYLENOL ARTHRITIS PAIN) 650 MG CR tablet Take 650 mg by mouth every 8 (eight) hours as needed.        Marland Kitchen amLODipine (NORVASC) 10 MG tablet Take 10 mg by mouth daily.        . brimonidine (ALPHAGAN P) 0.1 % SOLN Twice daily to left eye and three times daily to right eye      . calcium-vitamin D (OSCAL 500/200 D-3) 500-200 MG-UNIT per tablet Take 1 tablet by mouth 2 (two) times daily.       . dorzolamide-timolol (COSOPT) 22.3-6.8 MG/ML ophthalmic solution Place 1 drop into both eyes 2 (two) times daily.       Marland Kitchen doxazosin (CARDURA) 8 MG tablet TAKE 1 TABLET BY MOUTH ONCE DAILY  30 tablet  2  . furosemide (LASIX) 40 MG tablet TAKE 1 TABLET BY MOUTH ONCE DAILY  30 tablet  0  . latanoprost (XALATAN) 0.005 % ophthalmic solution Place 1 drop into both eyes at bedtime.      Marland Kitchen omeprazole (PRILOSEC) 20 MG capsule Take 20 mg by mouth daily.       . traMADol (ULTRAM) 50 MG tablet Take 50 mg by mouth 3 (three) times daily as needed.      . [DISCONTINUED] levofloxacin (LEVAQUIN) 500 MG tablet Take 1 tablet by mouth daily.      . [DISCONTINUED] gabapentin (NEURONTIN) 300 MG capsule Take 300 mg by mouth 2 (two) times daily.      . [DISCONTINUED] Multiple Vitamins-Minerals (OCUVITE ADULT FORMULA PO) Take by mouth daily.         No facility-administered encounter medications on file as of 06/09/2013.

## 2013-06-09 NOTE — Patient Instructions (Signed)
No change in medications. Return in         4 months 

## 2013-06-16 ENCOUNTER — Ambulatory Visit: Payer: Medicare Other | Admitting: Critical Care Medicine

## 2013-07-10 ENCOUNTER — Telehealth: Payer: Self-pay | Admitting: Critical Care Medicine

## 2013-07-10 NOTE — Telephone Encounter (Signed)
Pt has slow heart rate and is on timolol  Call pts eye MD Dr Catheryn Bacon to ? If can stop cosopt??

## 2013-07-10 NOTE — Telephone Encounter (Signed)
Called [phone], spoke with Kennith Center.  Was advised Dr. Lottie Dawson or the nurse in not in right now.  They have both went to lunch.  Kennith Center will ask the nurse to call back when she comes back.

## 2013-07-10 NOTE — Telephone Encounter (Signed)
lmomtcb for pt 

## 2013-07-10 NOTE — Telephone Encounter (Signed)
Left detailed msg on pt's VM advising of below given the weekend and pt's symptoms.   Will call back on Monday to follow up with pt.

## 2013-07-10 NOTE — Telephone Encounter (Signed)
Dr Lottie Dawson returning call can be reached at [phone].Raylene Everts

## 2013-07-10 NOTE — Telephone Encounter (Signed)
I spoke with Dr. Lottie Dawson regarding below. Per Dr. Lottie Dawson: have pt STOP the cosopt and continue with the xalantan eye drops.  Dr. Lottie Dawson will need to see pt in 1 month.  He will have his staff call pt to schedule this appt, and I will call pt to advise of Dr. Georgette Dover recs.  I have lmomtcb for pt.  Med list update with this change.

## 2013-07-10 NOTE — Telephone Encounter (Signed)
Attempted to call Dr. Lottie Dawson at the number listed below but there was no answer and not able to leave a VM.  Will try back later.

## 2013-07-13 NOTE — Telephone Encounter (Signed)
Called, spoke with pt.  He did receive my VM regarding below on Friday.   I have re instructed pt on this.   He verbalized understanding. Pt states he is now feeling better. He is to call back if anything further is needed and call Dr. Georgette Dover office if needed as well. He is aware Dr. Georgette Dover office is supposed to be calling him to schedule appt with his office.

## 2013-07-20 DIAGNOSIS — H409 Unspecified glaucoma: Secondary | ICD-10-CM | POA: Diagnosis not present

## 2013-07-20 DIAGNOSIS — H4011X0 Primary open-angle glaucoma, stage unspecified: Secondary | ICD-10-CM | POA: Diagnosis not present

## 2013-07-20 DIAGNOSIS — B029 Zoster without complications: Secondary | ICD-10-CM | POA: Diagnosis not present

## 2013-07-21 ENCOUNTER — Telehealth: Payer: Self-pay | Admitting: Critical Care Medicine

## 2013-07-21 NOTE — Telephone Encounter (Signed)
ATC - Busy x 2 - Will try back

## 2013-07-21 NOTE — Telephone Encounter (Signed)
Crystal the pt states he received a call from you yesterday? I could not find anything in epic about a call. Please advise. Carron Curie, CMA

## 2013-07-21 NOTE — Telephone Encounter (Signed)
LMTC x 1  

## 2013-07-22 NOTE — Telephone Encounter (Signed)
The last time I spoke with pt was on Nov 24.  Prior to this I left him a few msgs.  I have not tried to call him since Nov 24. Called, spoke with pt.  States he had a VM to return my call.  Advised of above. He verbalized understanding and states this was an old msg he was listening to.  Nothing further needed at this time.  He will call back if anything further is needed.

## 2013-08-17 DIAGNOSIS — C44211 Basal cell carcinoma of skin of unspecified ear and external auricular canal: Secondary | ICD-10-CM | POA: Diagnosis not present

## 2013-08-17 DIAGNOSIS — L0292 Furuncle, unspecified: Secondary | ICD-10-CM | POA: Diagnosis not present

## 2013-09-21 DIAGNOSIS — H409 Unspecified glaucoma: Secondary | ICD-10-CM | POA: Diagnosis not present

## 2013-09-21 DIAGNOSIS — H4011X0 Primary open-angle glaucoma, stage unspecified: Secondary | ICD-10-CM | POA: Diagnosis not present

## 2013-09-28 DIAGNOSIS — Z85828 Personal history of other malignant neoplasm of skin: Secondary | ICD-10-CM | POA: Diagnosis not present

## 2013-10-06 ENCOUNTER — Observation Stay: Payer: Self-pay | Admitting: Specialist

## 2013-10-06 DIAGNOSIS — Z833 Family history of diabetes mellitus: Secondary | ICD-10-CM | POA: Diagnosis not present

## 2013-10-06 DIAGNOSIS — Z881 Allergy status to other antibiotic agents status: Secondary | ICD-10-CM | POA: Diagnosis not present

## 2013-10-06 DIAGNOSIS — Z792 Long term (current) use of antibiotics: Secondary | ICD-10-CM | POA: Diagnosis not present

## 2013-10-06 DIAGNOSIS — H409 Unspecified glaucoma: Secondary | ICD-10-CM | POA: Diagnosis not present

## 2013-10-06 DIAGNOSIS — Z8673 Personal history of transient ischemic attack (TIA), and cerebral infarction without residual deficits: Secondary | ICD-10-CM | POA: Diagnosis not present

## 2013-10-06 DIAGNOSIS — Z87891 Personal history of nicotine dependence: Secondary | ICD-10-CM | POA: Diagnosis not present

## 2013-10-06 DIAGNOSIS — J984 Other disorders of lung: Secondary | ICD-10-CM | POA: Diagnosis not present

## 2013-10-06 DIAGNOSIS — R091 Pleurisy: Secondary | ICD-10-CM | POA: Diagnosis not present

## 2013-10-06 DIAGNOSIS — I359 Nonrheumatic aortic valve disorder, unspecified: Secondary | ICD-10-CM | POA: Diagnosis not present

## 2013-10-06 DIAGNOSIS — F29 Unspecified psychosis not due to a substance or known physiological condition: Secondary | ICD-10-CM | POA: Diagnosis not present

## 2013-10-06 DIAGNOSIS — J189 Pneumonia, unspecified organism: Secondary | ICD-10-CM | POA: Diagnosis not present

## 2013-10-06 DIAGNOSIS — R5383 Other fatigue: Secondary | ICD-10-CM | POA: Diagnosis not present

## 2013-10-06 DIAGNOSIS — R55 Syncope and collapse: Secondary | ICD-10-CM | POA: Diagnosis not present

## 2013-10-06 DIAGNOSIS — N189 Chronic kidney disease, unspecified: Secondary | ICD-10-CM | POA: Diagnosis not present

## 2013-10-06 DIAGNOSIS — R29810 Facial weakness: Secondary | ICD-10-CM | POA: Diagnosis not present

## 2013-10-06 DIAGNOSIS — I10 Essential (primary) hypertension: Secondary | ICD-10-CM | POA: Diagnosis not present

## 2013-10-06 DIAGNOSIS — Z885 Allergy status to narcotic agent status: Secondary | ICD-10-CM | POA: Diagnosis not present

## 2013-10-06 DIAGNOSIS — I517 Cardiomegaly: Secondary | ICD-10-CM

## 2013-10-06 DIAGNOSIS — N400 Enlarged prostate without lower urinary tract symptoms: Secondary | ICD-10-CM | POA: Diagnosis not present

## 2013-10-06 DIAGNOSIS — R5381 Other malaise: Secondary | ICD-10-CM | POA: Diagnosis not present

## 2013-10-06 DIAGNOSIS — Z823 Family history of stroke: Secondary | ICD-10-CM | POA: Diagnosis not present

## 2013-10-06 DIAGNOSIS — G459 Transient cerebral ischemic attack, unspecified: Secondary | ICD-10-CM | POA: Diagnosis not present

## 2013-10-06 LAB — COMPREHENSIVE METABOLIC PANEL
ALK PHOS: 112 U/L
ALT: 16 U/L (ref 12–78)
AST: 23 U/L (ref 15–37)
Albumin: 3.1 g/dL — ABNORMAL LOW (ref 3.4–5.0)
Anion Gap: 3 — ABNORMAL LOW (ref 7–16)
BILIRUBIN TOTAL: 0.4 mg/dL (ref 0.2–1.0)
BUN: 23 mg/dL — AB (ref 7–18)
CREATININE: 1.34 mg/dL — AB (ref 0.60–1.30)
Calcium, Total: 9.3 mg/dL (ref 8.5–10.1)
Chloride: 108 mmol/L — ABNORMAL HIGH (ref 98–107)
Co2: 29 mmol/L (ref 21–32)
EGFR (African American): 53 — ABNORMAL LOW
EGFR (Non-African Amer.): 46 — ABNORMAL LOW
GLUCOSE: 102 mg/dL — AB (ref 65–99)
OSMOLALITY: 283 (ref 275–301)
Potassium: 3.9 mmol/L (ref 3.5–5.1)
SODIUM: 140 mmol/L (ref 136–145)
TOTAL PROTEIN: 7 g/dL (ref 6.4–8.2)

## 2013-10-06 LAB — URINALYSIS, COMPLETE
BACTERIA: NONE SEEN
BILIRUBIN, UR: NEGATIVE
Blood: NEGATIVE
Glucose,UR: NEGATIVE mg/dL (ref 0–75)
Hyaline Cast: 22
KETONE: NEGATIVE
Leukocyte Esterase: NEGATIVE
NITRITE: NEGATIVE
Ph: 6 (ref 4.5–8.0)
Protein: NEGATIVE
SPECIFIC GRAVITY: 1.019 (ref 1.003–1.030)
WBC UR: 1 /HPF (ref 0–5)

## 2013-10-06 LAB — CBC
HCT: 34.4 % — AB (ref 40.0–52.0)
HGB: 11.6 g/dL — AB (ref 13.0–18.0)
MCH: 32.3 pg (ref 26.0–34.0)
MCHC: 33.7 g/dL (ref 32.0–36.0)
MCV: 96 fL (ref 80–100)
Platelet: 177 10*3/uL (ref 150–440)
RBC: 3.59 10*6/uL — ABNORMAL LOW (ref 4.40–5.90)
RDW: 15.4 % — ABNORMAL HIGH (ref 11.5–14.5)
WBC: 5.5 10*3/uL (ref 3.8–10.6)

## 2013-10-06 LAB — TROPONIN I: Troponin-I: <0.02 ng/mL

## 2013-10-07 DIAGNOSIS — G459 Transient cerebral ischemic attack, unspecified: Secondary | ICD-10-CM | POA: Diagnosis not present

## 2013-10-07 DIAGNOSIS — J984 Other disorders of lung: Secondary | ICD-10-CM | POA: Diagnosis not present

## 2013-10-07 DIAGNOSIS — N189 Chronic kidney disease, unspecified: Secondary | ICD-10-CM | POA: Diagnosis not present

## 2013-10-07 LAB — CBC WITH DIFFERENTIAL/PLATELET
BASOS ABS: 0 10*3/uL (ref 0.0–0.1)
BASOS PCT: 0.5 %
EOS ABS: 0.1 10*3/uL (ref 0.0–0.7)
Eosinophil %: 2.4 %
HCT: 33.1 % — ABNORMAL LOW (ref 40.0–52.0)
HGB: 11.1 g/dL — ABNORMAL LOW (ref 13.0–18.0)
Lymphocyte #: 1.2 10*3/uL (ref 1.0–3.6)
Lymphocyte %: 20.4 %
MCH: 32.4 pg (ref 26.0–34.0)
MCHC: 33.6 g/dL (ref 32.0–36.0)
MCV: 96 fL (ref 80–100)
MONOS PCT: 14.7 %
Monocyte #: 0.9 x10 3/mm (ref 0.2–1.0)
NEUTROS PCT: 62 %
Neutrophil #: 3.7 10*3/uL (ref 1.4–6.5)
PLATELETS: 171 10*3/uL (ref 150–440)
RBC: 3.44 10*6/uL — ABNORMAL LOW (ref 4.40–5.90)
RDW: 15.7 % — ABNORMAL HIGH (ref 11.5–14.5)
WBC: 5.9 10*3/uL (ref 3.8–10.6)

## 2013-10-07 LAB — BASIC METABOLIC PANEL
Anion Gap: 5 — ABNORMAL LOW (ref 7–16)
BUN: 21 mg/dL — AB (ref 7–18)
CO2: 26 mmol/L (ref 21–32)
Calcium, Total: 9.4 mg/dL (ref 8.5–10.1)
Chloride: 109 mmol/L — ABNORMAL HIGH (ref 98–107)
Creatinine: 1.18 mg/dL (ref 0.60–1.30)
EGFR (Non-African Amer.): 54 — ABNORMAL LOW
GLUCOSE: 91 mg/dL (ref 65–99)
Osmolality: 282 (ref 275–301)
Potassium: 4.1 mmol/L (ref 3.5–5.1)
Sodium: 140 mmol/L (ref 136–145)

## 2013-10-07 LAB — LIPID PANEL
CHOLESTEROL: 143 mg/dL (ref 0–200)
HDL: 44 mg/dL (ref 40–60)
LDL CHOLESTEROL, CALC: 83 mg/dL (ref 0–100)
TRIGLYCERIDES: 79 mg/dL (ref 0–200)
VLDL CHOLESTEROL, CALC: 16 mg/dL (ref 5–40)

## 2013-10-07 LAB — MAGNESIUM: MAGNESIUM: 2 mg/dL

## 2013-10-08 ENCOUNTER — Ambulatory Visit: Payer: Medicare Other | Admitting: Critical Care Medicine

## 2013-10-09 ENCOUNTER — Ambulatory Visit (INDEPENDENT_AMBULATORY_CARE_PROVIDER_SITE_OTHER): Payer: Medicare Other | Admitting: Critical Care Medicine

## 2013-10-09 ENCOUNTER — Encounter: Payer: Self-pay | Admitting: Critical Care Medicine

## 2013-10-09 VITALS — BP 100/56 | HR 61 | Temp 97.5°F | Ht 65.0 in | Wt 159.6 lb

## 2013-10-09 DIAGNOSIS — R918 Other nonspecific abnormal finding of lung field: Secondary | ICD-10-CM | POA: Insufficient documentation

## 2013-10-09 DIAGNOSIS — J852 Abscess of lung without pneumonia: Secondary | ICD-10-CM

## 2013-10-09 DIAGNOSIS — R222 Localized swelling, mass and lump, trunk: Secondary | ICD-10-CM | POA: Diagnosis not present

## 2013-10-09 NOTE — Patient Instructions (Signed)
No change in medications Return for recheck 2 months

## 2013-10-09 NOTE — Progress Notes (Signed)
Subjective:    Patient ID: Alexander Goodwin, male    DOB: [birth year], 80 y.o.   MRN: [id number]  HPI  80 y.o. 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 4 month follow up    Was in [facility] on Tuesday - was advised he has aspiration pna.  Has some wheezing and nonprod cough.  No chest tightness/pain or f/c/s.   Pt in hosp x 1 day [facility] this week for asp pna.  Passed out. Min cough. No real chest pain. Notes some wheeze.  No edema in feet.  No fever now  No abx on D/C.   Mucus is still yellow   Past Medical History  Diagnosis Date  . Diverticulitis of colon   . History of colon polyps   . Arthritis     right knee, hips, neck. He denies any major limitation in activities  . GERD (gastroesophageal reflux disease)     well controlled with medication. No nocturnal symptoms on a regular basis  . Glaucoma     both eyes, followed closely by Dr. Janyth Contes  . Esophagitis     Last EGD Fall '11   . Abscess of lung without pneumonia     Right upper lobe chronic lung abscess and prior left upper lobe lung abscess due to chronic aspiration  . Pancreatitis     resolved after GB surgery  . Stroke     TIAs - age 79  . BPH (benign prostatic hypertrophy) with urinary obstruction   . Hyperlipemia   . Hypertension   . Seasonal allergies   . Dysphagia, oropharyngeal      Family History  Problem Relation Age of Onset  . Diabetes Mother   . Hypertension Mother   . Heart disease Mother   . Stroke Father   . Heart disease Father   . Diabetes Father   . Cancer Sister     uterine cancer  . Cancer Brother   . COPD Brother   . Hypertension Brother   . Cancer Brother   . Cancer Sister     gyn malignancy  . Cancer Sister     pancreatic cancer  . Allergies Sister   . Allergies Son   . Rheum arthritis Father   . Asthma Sister   . Asthma Son      History   Social History  . Marital Status: Married    Spouse  Name: N/A    Number of Children: 3  . Years of Education: HSG   Occupational History  . 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 Smoking status: Former Smoker -- 0.50 packs/day for 30 years    Types: Pipe, Cigars    Quit date: 08/20/1978  . Smokeless tobacco: Former Systems developer    Types: Chew    Quit date: 11/16/1990  . Alcohol Use: No  . Drug Use: No  . Sexual Activity: Yes   Other Topics Concern  . Not on file   Social History Narrative   HSG.   Married 1948. 3 sons - '52, '57,'58- 5 grandchildren, one deceased at 62 - OD.  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 Marriage in good health. End of Life Care -DNR/DNI,  No prolonged heroic or futile measures.      Allergies  Allergen Reactions  . Clindamycin/Lincomycin  itching     Outpatient Prescriptions Prior to Visit  Medication Sig Dispense Refill  . acetaminophen (TYLENOL ARTHRITIS PAIN) 650 MG CR tablet Take 650 mg by mouth every 8 (eight) hours as needed.        Marland Kitchen amLODipine (NORVASC) 10 MG tablet Take 10 mg by mouth daily.        . brimonidine (ALPHAGAN P) 0.1 % SOLN Twice daily to both eyes      . calcium-vitamin D (OSCAL 500/200 D-3) 500-200 MG-UNIT per tablet Take 1 tablet by mouth 2 (two) times daily.       Marland Kitchen doxazosin (CARDURA) 8 MG tablet TAKE 1 TABLET BY MOUTH ONCE DAILY  30 tablet  2  . latanoprost (XALATAN) 0.005 % ophthalmic solution Place 1 drop into both eyes at bedtime.      Marland Kitchen omeprazole (PRILOSEC) 20 MG capsule Take 20 mg by mouth daily.      . traMADol (ULTRAM) 50 MG tablet Take 50 mg by mouth 3 (three) times daily as needed.      . furosemide (LASIX) 40 MG tablet TAKE 1 TABLET BY MOUTH ONCE DAILY  30 tablet  0   No facility-administered medications prior to visit.       Review of Systems  Constitutional: Positive for fatigue. Negative for fever, chills, diaphoresis, activity change, appetite change and unexpected  weight change.  HENT: Positive for congestion, dental problem, hearing loss, postnasal drip, rhinorrhea and voice change. Negative for ear discharge, ear pain, facial swelling, mouth sores, nosebleeds, sinus pressure, sneezing, sore throat, tinnitus and trouble swallowing.   Eyes: Positive for discharge and itching. Negative for photophobia and visual disturbance.  Respiratory: Positive for cough and shortness of breath. Negative for apnea, choking, chest tightness, wheezing and stridor.   Cardiovascular: Negative for chest pain, palpitations and leg swelling.  Gastrointestinal: Negative for nausea, vomiting, abdominal pain, constipation, blood in stool and abdominal distention.  Genitourinary: Positive for frequency. Negative for dysuria, urgency, hematuria, flank pain, decreased urine volume and difficulty urinating.  Musculoskeletal: Negative for arthralgias, back pain, gait problem, joint swelling, myalgias, neck pain and neck stiffness.  Skin: Negative for color change, pallor and rash.  Neurological: Negative for dizziness, tremors, seizures, syncope, speech difficulty, weakness, light-headedness, numbness and headaches.  Hematological: Negative for adenopathy. Bruises/bleeds easily.  Psychiatric/Behavioral: Negative for confusion, sleep disturbance and agitation. The patient is nervous/anxious.        Objective:   Physical Exam  Filed Vitals:   10/09/13 1133  BP: 100/56  Pulse: 61  Temp: 97.5 F (36.4 C)  TempSrc: Oral  Height: 5\' 5"  (1.651 m)  Weight: 159 lb 9.6 oz (72.394 kg)  SpO2: 97%    Gen: Pleasant, well-nourished, elderly male  in no distress,  normal affect  ENT: No lesions,  mouth clear,  oropharynx clear, no postnasal drip  Neck: No JVD, no TMG, no carotid bruits  Lungs: No use of accessory muscles, no dullness to percussion, clear  Cardiovascular: RRR, heart sounds normal, no murmur or gallops, no peripheral edema  Abdomen: soft and NT, no HSM,  BS  normal  Musculoskeletal: No deformities, no cyanosis or clubbing  Neuro: alert, non focal  Skin: Warm, no lesions or rashes  CT scan imaging was reviewed CT scan February 2015: Nodular pleural thickening bilaterally in the suprahilar regions and masslike nodular thickening over the right upper lobe with spiculation of orders. Spiculated mass like density lateral to right hilar region. There is narrowing of adjacent airway. There is  evidence of post obstructive atelectasis as well. Malignancy cannot be excluded. There is no adenopathy seen.   No results found.    Assessment & Plan:   Lung abscess Recurrent right upper lobe and right lower lobe aspiration pneumonia now with scarring in right lower lobe with airway involvement. Doubt malignancy in this situation. 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 In this setting will observe as the patient is relatively asymptomatic.  Plan No additional antibiotics Continued observation    Updated Medication List Outpatient Encounter Prescriptions as of 10/09/2013  Medication Sig  . acetaminophen (TYLENOL ARTHRITIS PAIN) 650 MG CR tablet Take 650 mg by mouth every 8 (eight) hours as needed.    Marland Kitchen amLODipine (NORVASC) 10 MG tablet Take 10 mg by mouth daily.    . AZOPT 1 % ophthalmic suspension Place 1 drop into both eyes 2 (two) times daily.  . beta carotene w/minerals (OCUVITE) tablet Take 1 tablet by mouth daily.  . brimonidine (ALPHAGAN P) 0.1 % SOLN Twice daily to both eyes  . calcium-vitamin D (OSCAL 500/200 D-3) 500-200 MG-UNIT per tablet Take 1 tablet by mouth 2 (two) times daily.   Marland Kitchen doxazosin (CARDURA) 8 MG tablet TAKE 1 TABLET BY MOUTH ONCE DAILY  . furosemide (LASIX) 40 MG tablet TAKE 1/2 TABLET BY MOUTH ONCE DAILY  . gabapentin (NEURONTIN) 300 MG capsule Take 1 capsule by mouth 2 (two) times daily.  Marland Kitchen latanoprost (XALATAN) 0.005 % ophthalmic solution Place 1 drop into  both eyes at bedtime.  . Multiple Vitamin (MULTIVITAMIN) tablet Take 1 tablet by mouth daily.  Marland Kitchen omeprazole (PRILOSEC) 20 MG capsule Take 20 mg by mouth daily.  . traMADol (ULTRAM) 50 MG tablet Take 50 mg by mouth 3 (three) times daily as needed.  . [DISCONTINUED] furosemide (LASIX) 40 MG tablet TAKE 1 TABLET BY MOUTH ONCE DAILY

## 2013-10-10 NOTE — Assessment & Plan Note (Signed)
Recurrent right upper lobe and right lower lobe aspiration pneumonia now with scarring in right lower lobe with airway involvement. Doubt malignancy in this situation. 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 In this setting will observe as the patient is relatively asymptomatic.  Plan No additional antibiotics Continued observation

## 2013-10-11 LAB — CULTURE, BLOOD (SINGLE)

## 2013-10-19 ENCOUNTER — Telehealth: Payer: Self-pay | Admitting: Critical Care Medicine

## 2013-10-19 NOTE — Telephone Encounter (Signed)
lmtcb x1 

## 2013-10-20 NOTE — Telephone Encounter (Signed)
Patient returning call.

## 2013-10-20 NOTE — Telephone Encounter (Signed)
I spoke with the pt spouse and she wanted to know why the pt was not given an abx at last OV. I explained the below per last OV note. She states understanding, nothing further needed. McLaughlin Bing, CMA

## 2013-10-20 NOTE — Telephone Encounter (Addendum)
[id number]. 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 In this setting will observe as the patient is relatively asymptomatic.  Plan  No additional antibiotics  Continued observation

## 2013-10-26 DIAGNOSIS — R197 Diarrhea, unspecified: Secondary | ICD-10-CM | POA: Diagnosis not present

## 2013-12-09 ENCOUNTER — Encounter: Payer: Self-pay | Admitting: Critical Care Medicine

## 2013-12-09 ENCOUNTER — Ambulatory Visit (INDEPENDENT_AMBULATORY_CARE_PROVIDER_SITE_OTHER): Payer: Medicare Other | Admitting: Critical Care Medicine

## 2013-12-09 VITALS — BP 136/72 | HR 59 | Temp 97.5°F | Ht 64.0 in | Wt 164.2 lb

## 2013-12-09 DIAGNOSIS — J852 Abscess of lung without pneumonia: Secondary | ICD-10-CM

## 2013-12-09 NOTE — Progress Notes (Signed)
Subjective:    Patient ID: Alexander Goodwin, male    DOB: February 26, 1921, 80 y.o.   MRN: [id number]  HPI  80 y.o. 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 4 month follow up    Was in [facility] on Tuesday - was advised he has aspiration pna.  Has some wheezing and nonprod cough.  No chest tightness/pain or f/c/s.   Pt in hosp x 1 day [facility] this week for asp pna.  Passed out. Min cough. No real chest pain. Notes some wheeze.  No edema in feet.  No fever now  No abx on D/C.   Mucus is still yellow  12/09/2013 Chief Complaint  Patient presents with  . 2 month follow up    Breathing is unchanged.  Coughing a lot qhs with yellow mucus.  Has a little wheezing.  No chest tightness/pain or f/c/s.  No real changes.  Yellow mucus. Only had one spell of hemoptysis   Past Medical History  Diagnosis Date  . Diverticulitis of colon   . History of colon polyps   . Arthritis     right knee, hips, neck. He denies any major limitation in activities  . GERD (gastroesophageal reflux disease)     well controlled with medication. No nocturnal symptoms on a regular basis  . Glaucoma     both eyes, followed closely by Dr. Janyth Contes  . Esophagitis     Last EGD Fall '11   . Abscess of lung without pneumonia     Right upper lobe chronic lung abscess and prior left upper lobe lung abscess due to chronic aspiration  . Pancreatitis     resolved after GB surgery  . Stroke     TIAs - age 8  . BPH (benign prostatic hypertrophy) with urinary obstruction   . Hyperlipemia   . Hypertension   . Seasonal allergies   . Dysphagia, oropharyngeal      Family History  Problem Relation Age of Onset  . Diabetes Mother   . Hypertension Mother   . Heart disease Mother   . Stroke Father   . Heart disease Father   . Diabetes Father   . Cancer Sister     uterine cancer  . Cancer Brother   . COPD Brother   . Hypertension Brother   . Cancer  Brother   . Cancer Sister     gyn malignancy  . Cancer Sister     pancreatic cancer  . Allergies Sister   . Allergies Son   . Rheum arthritis Father   . Asthma Sister   . Asthma Son      History   Social History  . Marital Status: Married    Spouse Name: N/A    Number of Children: 3  . Years of Education: HSG   Occupational History  . 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 Smoking status: Former Smoker -- 0.50 packs/day for 30 years    Types: Pipe, Cigars    Quit date: 08/20/1978  . Smokeless tobacco: Former Systems developer    Types: Chew    Quit date: 11/16/1990  . Alcohol Use: No  . Drug Use: No  . Sexual Activity: Yes   Other Topics Concern  . Not on file   Social History Narrative   HSG.   Married 1948. 3 sons - '  74, '57,'58- 5 grandchildren, one deceased at 60 - OD.  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 Marriage in good health. End of Life Care -DNR/DNI,  No prolonged heroic or futile measures.      Allergies  Allergen Reactions  . Clindamycin/Lincomycin     itching     Outpatient Prescriptions Prior to Visit  Medication Sig Dispense Refill  . acetaminophen (TYLENOL ARTHRITIS PAIN) 650 MG CR tablet Take 650 mg by mouth every 8 (eight) hours as needed.        Marland Kitchen amLODipine (NORVASC) 10 MG tablet Take 10 mg by mouth daily.        . AZOPT 1 % ophthalmic suspension Place 1 drop into both eyes 2 (two) times daily.      . beta carotene w/minerals (OCUVITE) tablet Take 1 tablet by mouth daily.      . brimonidine (ALPHAGAN P) 0.1 % SOLN Twice daily to both eyes      . calcium-vitamin D (OSCAL 500/200 D-3) 500-200 MG-UNIT per tablet Take 1 tablet by mouth daily.       Marland Kitchen doxazosin (CARDURA) 8 MG tablet TAKE 1 TABLET BY MOUTH ONCE DAILY  30 tablet  2  . furosemide (LASIX) 40 MG tablet TAKE 1/2 TABLET BY MOUTH ONCE DAILY      . gabapentin (NEURONTIN) 300 MG capsule Take 1 capsule by mouth 2  (two) times daily.      Marland Kitchen latanoprost (XALATAN) 0.005 % ophthalmic solution Place 1 drop into both eyes at bedtime.      . Multiple Vitamin (MULTIVITAMIN) tablet Take 1 tablet by mouth daily.      Marland Kitchen omeprazole (PRILOSEC) 20 MG capsule Take 20 mg by mouth daily.      . traMADol (ULTRAM) 50 MG tablet Take 50 mg by mouth 3 (three) times daily as needed.       No facility-administered medications prior to visit.       Review of Systems  Constitutional: Positive for fatigue. Negative for fever, chills, diaphoresis, activity change, appetite change and unexpected weight change.  HENT: Positive for congestion, dental problem, hearing loss, postnasal drip, rhinorrhea and voice change. Negative for ear discharge, ear pain, facial swelling, mouth sores, nosebleeds, sinus pressure, sneezing, sore throat, tinnitus and trouble swallowing.   Eyes: Positive for discharge and itching. Negative for photophobia and visual disturbance.  Respiratory: Positive for cough and shortness of breath. Negative for apnea, choking, chest tightness, wheezing and stridor.   Cardiovascular: Negative for chest pain, palpitations and leg swelling.  Gastrointestinal: Negative for nausea, vomiting, abdominal pain, constipation, blood in stool and abdominal distention.  Genitourinary: Positive for frequency. Negative for dysuria, urgency, hematuria, flank pain, decreased urine volume and difficulty urinating.  Musculoskeletal: Negative for arthralgias, back pain, gait problem, joint swelling, myalgias, neck pain and neck stiffness.  Skin: Negative for color change, pallor and rash.  Neurological: Negative for dizziness, tremors, seizures, syncope, speech difficulty, weakness, light-headedness, numbness and headaches.  Hematological: Negative for adenopathy. Bruises/bleeds easily.  Psychiatric/Behavioral: Negative for confusion, sleep disturbance and agitation. The patient is nervous/anxious.        Objective:   Physical  Exam  Filed Vitals:   12/09/13 1152  BP: 136/72  Pulse: 59  Temp: 97.5 F (36.4 C)  TempSrc: Oral  Height: 5\' 4"  (1.626 m)  Weight: 164 lb 3.2 oz (74.481 kg)  SpO2: 96%    Gen: Pleasant, well-nourished, elderly male  in no distress,  normal affect  ENT: No lesions,  mouth clear,  oropharynx clear, no postnasal drip  Neck: No JVD, no TMG, no carotid bruits  Lungs: No use of accessory muscles, no dullness to percussion, clear  Cardiovascular: RRR, heart sounds normal, no murmur or gallops, no peripheral edema  Abdomen: soft and NT, no HSM,  BS normal  Musculoskeletal: No deformities, no cyanosis or clubbing  Neuro: alert, non focal  Skin: Warm, no lesions or rashes  CT scan imaging was reviewed CT scan February 2015: Nodular pleural thickening bilaterally in the suprahilar regions and masslike nodular thickening over the right upper lobe with spiculation of orders. Spiculated mass like density lateral to right hilar region. There is narrowing of adjacent airway. There is evidence of post obstructive atelectasis as well. Malignancy cannot be excluded. There is no adenopathy seen.   No results found.    Assessment & Plan:   Lung abscess Hx recurrent aspriation with prior lung abscess now resolved Plan No change in medication    Updated Medication List Outpatient Encounter Prescriptions as of 12/09/2013  Medication Sig  . acetaminophen (TYLENOL ARTHRITIS PAIN) 650 MG CR tablet Take 650 mg by mouth every 8 (eight) hours as needed.    Marland Kitchen amLODipine (NORVASC) 10 MG tablet Take 10 mg by mouth daily.    . AZOPT 1 % ophthalmic suspension Place 1 drop into both eyes 2 (two) times daily.  . beta carotene w/minerals (OCUVITE) tablet Take 1 tablet by mouth daily.  . brimonidine (ALPHAGAN P) 0.1 % SOLN Twice daily to both eyes  . calcium-vitamin D (OSCAL 500/200 D-3) 500-200 MG-UNIT per tablet Take 1 tablet by mouth daily.   Marland Kitchen doxazosin (CARDURA) 8 MG tablet TAKE 1 TABLET BY  MOUTH ONCE DAILY  . furosemide (LASIX) 40 MG tablet TAKE 1/2 TABLET BY MOUTH ONCE DAILY  . gabapentin (NEURONTIN) 300 MG capsule Take 1 capsule by mouth 2 (two) times daily.  Marland Kitchen latanoprost (XALATAN) 0.005 % ophthalmic solution Place 1 drop into both eyes at bedtime.  . Multiple Vitamin (MULTIVITAMIN) tablet Take 1 tablet by mouth daily.  Marland Kitchen omeprazole (PRILOSEC) 20 MG capsule Take 20 mg by mouth daily.  . traMADol (ULTRAM) 50 MG tablet Take 50 mg by mouth 3 (three) times daily as needed.

## 2013-12-09 NOTE — Patient Instructions (Signed)
No change in medications. Return in         4 months 

## 2013-12-10 NOTE — Assessment & Plan Note (Signed)
Hx recurrent aspriation with prior lung abscess now resolved Plan No change in medication

## 2013-12-21 DIAGNOSIS — H4011X0 Primary open-angle glaucoma, stage unspecified: Secondary | ICD-10-CM | POA: Diagnosis not present

## 2013-12-21 DIAGNOSIS — H409 Unspecified glaucoma: Secondary | ICD-10-CM | POA: Diagnosis not present

## 2013-12-30 DIAGNOSIS — H409 Unspecified glaucoma: Secondary | ICD-10-CM | POA: Diagnosis not present

## 2013-12-30 DIAGNOSIS — D500 Iron deficiency anemia secondary to blood loss (chronic): Secondary | ICD-10-CM | POA: Diagnosis not present

## 2013-12-30 DIAGNOSIS — H4011X0 Primary open-angle glaucoma, stage unspecified: Secondary | ICD-10-CM | POA: Diagnosis not present

## 2013-12-30 DIAGNOSIS — R112 Nausea with vomiting, unspecified: Secondary | ICD-10-CM | POA: Diagnosis not present

## 2013-12-30 DIAGNOSIS — K219 Gastro-esophageal reflux disease without esophagitis: Secondary | ICD-10-CM | POA: Diagnosis not present

## 2014-01-13 DIAGNOSIS — M79609 Pain in unspecified limb: Secondary | ICD-10-CM | POA: Diagnosis not present

## 2014-01-13 DIAGNOSIS — B351 Tinea unguium: Secondary | ICD-10-CM | POA: Diagnosis not present

## 2014-04-05 DIAGNOSIS — I10 Essential (primary) hypertension: Secondary | ICD-10-CM | POA: Diagnosis not present

## 2014-04-05 DIAGNOSIS — Z7189 Other specified counseling: Secondary | ICD-10-CM | POA: Diagnosis not present

## 2014-04-05 DIAGNOSIS — R351 Nocturia: Secondary | ICD-10-CM | POA: Diagnosis not present

## 2014-04-05 DIAGNOSIS — Z8673 Personal history of transient ischemic attack (TIA), and cerebral infarction without residual deficits: Secondary | ICD-10-CM | POA: Insufficient documentation

## 2014-04-05 DIAGNOSIS — Z125 Encounter for screening for malignant neoplasm of prostate: Secondary | ICD-10-CM | POA: Diagnosis not present

## 2014-04-05 DIAGNOSIS — K219 Gastro-esophageal reflux disease without esophagitis: Secondary | ICD-10-CM | POA: Diagnosis not present

## 2014-04-21 ENCOUNTER — Encounter: Payer: Self-pay | Admitting: Critical Care Medicine

## 2014-04-21 ENCOUNTER — Ambulatory Visit (INDEPENDENT_AMBULATORY_CARE_PROVIDER_SITE_OTHER): Payer: Medicare Other | Admitting: Critical Care Medicine

## 2014-04-21 VITALS — BP 116/52 | HR 59 | Temp 96.7°F | Ht 64.0 in | Wt 161.8 lb

## 2014-04-21 DIAGNOSIS — Z23 Encounter for immunization: Secondary | ICD-10-CM | POA: Diagnosis not present

## 2014-04-21 DIAGNOSIS — J852 Abscess of lung without pneumonia: Secondary | ICD-10-CM | POA: Diagnosis not present

## 2014-04-21 NOTE — Patient Instructions (Signed)
Keep head of bed elevated Watch your diet, reflux diet Flu vaccine was given Return 4 months

## 2014-04-21 NOTE — Progress Notes (Signed)
Subjective:    Patient ID: Alexander Goodwin, male    DOB: 20-Nov-1920, 80 y.o.   MRN: [id number]  HPI  80 y.o. 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 4 month follow up    Was in [facility] on Tuesday - was advised he has aspiration pna.  Has some wheezing and nonprod cough.  No chest tightness/pain or f/c/s.   Pt in hosp x 1 day [facility] this week for asp pna.  Passed out. Min cough. No real chest pain. Notes some wheeze.  No edema in feet.  No fever now  No abx on D/C.   Mucus is still yellow  12/09/2013 Chief Complaint  Patient presents with  . 2 month follow up    Breathing is unchanged.  Coughing a lot qhs with yellow mucus.  Has a little wheezing.  No chest tightness/pain or f/c/s.  No real changes.  Yellow mucus. Only had one spell of hemoptysis  04/21/2014 Chief Complaint  Patient presents with  . 4 month follow up    SOB unchanged.  Increased cough x 2-3 months - occas prod with white mucus.  No chest tightness/pain  Dyspnea is the same.  No blood in mucus.  No chest pain, notes some heartburn.  No coughing after swallow.  Drinking water will make the pt cough.  Coughs a lot when first goes to bed.      Review of Systems  Constitutional: Positive for fatigue. Negative for fever, chills, diaphoresis, activity change, appetite change and unexpected weight change.  HENT: Positive for congestion, dental problem, hearing loss, postnasal drip, rhinorrhea and voice change. Negative for ear discharge, ear pain, facial swelling, mouth sores, nosebleeds, sinus pressure, sneezing, sore throat, tinnitus and trouble swallowing.   Eyes: Positive for discharge and itching. Negative for photophobia and visual disturbance.  Respiratory: Positive for cough and shortness of breath. Negative for apnea, choking, chest tightness, wheezing and stridor.   Cardiovascular: Negative for chest pain, palpitations and leg swelling.   Gastrointestinal: Negative for nausea, vomiting, abdominal pain, constipation, blood in stool and abdominal distention.  Genitourinary: Positive for frequency. Negative for dysuria, urgency, hematuria, flank pain, decreased urine volume and difficulty urinating.  Musculoskeletal: Negative for arthralgias, back pain, gait problem, joint swelling, myalgias, neck pain and neck stiffness.  Skin: Negative for color change, pallor and rash.  Neurological: Negative for dizziness, tremors, seizures, syncope, speech difficulty, weakness, light-headedness, numbness and headaches.  Hematological: Negative for adenopathy. Bruises/bleeds easily.  Psychiatric/Behavioral: Negative for confusion, sleep disturbance and agitation. The patient is nervous/anxious.        Objective:   Physical Exam  Filed Vitals:   04/21/14 1158  BP: 116/52  Pulse: 59  Temp: 96.7 F (35.9 C)  Height: 5\' 4"  (1.626 m)  Weight: 161 lb 12.8 oz (73.392 kg)  SpO2: 97%    Gen: Pleasant, well-nourished, elderly male  in no distress,  normal affect  ENT: No lesions,  mouth clear,  oropharynx clear, no postnasal drip  Neck: No JVD, no TMG, no carotid bruits  Lungs: No use of accessory muscles, no dullness to percussion, clear  Cardiovascular: RRR, heart sounds normal, no murmur or gallops, no peripheral edema  Abdomen: soft and NT, no HSM,  BS normal  Musculoskeletal: No deformities, no cyanosis or clubbing  Neuro: alert, non focal  Skin: Warm, no lesions or rashes  CT scan imaging was reviewed CT scan February  2015: Nodular pleural thickening bilaterally in the suprahilar regions and masslike nodular thickening over the right upper lobe with spiculation of orders. Spiculated mass like density lateral to right hilar region. There is narrowing of adjacent airway. There is evidence of post obstructive atelectasis as well. Malignancy cannot be excluded. There is no adenopathy seen.   No results found.    Assessment &  Plan:   No problem-specific assessment & plan notes found for this encounter.   Updated Medication List Outpatient Encounter Prescriptions as of 04/21/2014  Medication Sig  . acetaminophen (TYLENOL ARTHRITIS PAIN) 650 MG CR tablet Take 650 mg by mouth every 8 (eight) hours as needed.    Marland Kitchen amLODipine (NORVASC) 10 MG tablet Take 10 mg by mouth daily.    . AZOPT 1 % ophthalmic suspension Place 1 drop into both eyes 2 (two) times daily.  . beta carotene w/minerals (OCUVITE) tablet Take 1 tablet by mouth daily.  . brimonidine (ALPHAGAN P) 0.1 % SOLN Twice daily to both eyes  . calcium-vitamin D (OSCAL 500/200 D-3) 500-200 MG-UNIT per tablet Take 1 tablet by mouth daily.   Marland Kitchen doxazosin (CARDURA) 8 MG tablet TAKE 1 TABLET BY MOUTH ONCE DAILY  . furosemide (LASIX) 40 MG tablet TAKE 1/2 TABLET BY MOUTH ONCE DAILY  . Iron, Ferrous Gluconate, 256 (28 FE) MG TABS Take 1 tablet by mouth daily.  Marland Kitchen latanoprost (XALATAN) 0.005 % ophthalmic solution Place 1 drop into both eyes 2 (two) times daily.   . Multiple Vitamin (MULTIVITAMIN) tablet Take 1 tablet by mouth daily.  Marland Kitchen omeprazole (PRILOSEC) 20 MG capsule Take 20 mg by mouth daily.  . [DISCONTINUED] gabapentin (NEURONTIN) 300 MG capsule Take 1 capsule by mouth 2 (two) times daily.  . [DISCONTINUED] traMADol (ULTRAM) 50 MG tablet Take 50 mg by mouth 3 (three) times daily as needed.

## 2014-04-22 NOTE — Assessment & Plan Note (Signed)
History of lung abscess in right upper lobe secondary to chronic aspiration now resolved Plan Flu vaccine was given Reflux measures were encouraged including diet and head elevation

## 2014-05-10 DIAGNOSIS — H4011X0 Primary open-angle glaucoma, stage unspecified: Secondary | ICD-10-CM | POA: Diagnosis not present

## 2014-05-10 DIAGNOSIS — H409 Unspecified glaucoma: Secondary | ICD-10-CM | POA: Diagnosis not present

## 2014-07-30 DIAGNOSIS — X32XXXD Exposure to sunlight, subsequent encounter: Secondary | ICD-10-CM | POA: Diagnosis not present

## 2014-07-30 DIAGNOSIS — Z85828 Personal history of other malignant neoplasm of skin: Secondary | ICD-10-CM | POA: Diagnosis not present

## 2014-07-30 DIAGNOSIS — Z08 Encounter for follow-up examination after completed treatment for malignant neoplasm: Secondary | ICD-10-CM | POA: Diagnosis not present

## 2014-07-30 DIAGNOSIS — L820 Inflamed seborrheic keratosis: Secondary | ICD-10-CM | POA: Diagnosis not present

## 2014-07-30 DIAGNOSIS — L570 Actinic keratosis: Secondary | ICD-10-CM | POA: Diagnosis not present

## 2014-08-05 DIAGNOSIS — B0229 Other postherpetic nervous system involvement: Secondary | ICD-10-CM | POA: Diagnosis not present

## 2014-08-05 DIAGNOSIS — I10 Essential (primary) hypertension: Secondary | ICD-10-CM | POA: Diagnosis not present

## 2014-08-05 DIAGNOSIS — R5383 Other fatigue: Secondary | ICD-10-CM | POA: Diagnosis not present

## 2014-08-05 DIAGNOSIS — K219 Gastro-esophageal reflux disease without esophagitis: Secondary | ICD-10-CM | POA: Diagnosis not present

## 2014-08-09 DIAGNOSIS — H4011X3 Primary open-angle glaucoma, severe stage: Secondary | ICD-10-CM | POA: Diagnosis not present

## 2014-08-12 DIAGNOSIS — J209 Acute bronchitis, unspecified: Secondary | ICD-10-CM | POA: Diagnosis not present

## 2014-09-02 DIAGNOSIS — H409 Unspecified glaucoma: Secondary | ICD-10-CM | POA: Diagnosis not present

## 2014-09-02 DIAGNOSIS — H34831 Tributary (branch) retinal vein occlusion, right eye: Secondary | ICD-10-CM | POA: Diagnosis not present

## 2014-09-02 DIAGNOSIS — Z961 Presence of intraocular lens: Secondary | ICD-10-CM | POA: Diagnosis not present

## 2014-09-02 DIAGNOSIS — Z9889 Other specified postprocedural states: Secondary | ICD-10-CM | POA: Insufficient documentation

## 2014-09-02 DIAGNOSIS — H348392 Tributary (branch) retinal vein occlusion, unspecified eye, stable: Secondary | ICD-10-CM | POA: Insufficient documentation

## 2014-09-07 DIAGNOSIS — D045 Carcinoma in situ of skin of trunk: Secondary | ICD-10-CM | POA: Diagnosis not present

## 2014-09-07 DIAGNOSIS — Z85828 Personal history of other malignant neoplasm of skin: Secondary | ICD-10-CM | POA: Diagnosis not present

## 2014-09-07 DIAGNOSIS — Z08 Encounter for follow-up examination after completed treatment for malignant neoplasm: Secondary | ICD-10-CM | POA: Diagnosis not present

## 2014-09-15 ENCOUNTER — Ambulatory Visit (INDEPENDENT_AMBULATORY_CARE_PROVIDER_SITE_OTHER)
Admission: RE | Admit: 2014-09-15 | Discharge: 2014-09-15 | Disposition: A | Discharge status: Home / Self Care | Payer: Medicare Other | Source: Ambulatory Visit | Attending: Critical Care Medicine | Admitting: Critical Care Medicine

## 2014-09-15 ENCOUNTER — Ambulatory Visit (INDEPENDENT_AMBULATORY_CARE_PROVIDER_SITE_OTHER): Payer: Medicare Other | Admitting: Critical Care Medicine

## 2014-09-15 ENCOUNTER — Encounter: Payer: Self-pay | Admitting: Critical Care Medicine

## 2014-09-15 VITALS — BP 122/64 | HR 65 | Temp 97.8°F | Ht 63.0 in | Wt 159.2 lb

## 2014-09-15 DIAGNOSIS — J852 Abscess of lung without pneumonia: Secondary | ICD-10-CM

## 2014-09-15 DIAGNOSIS — R918 Other nonspecific abnormal finding of lung field: Secondary | ICD-10-CM | POA: Diagnosis not present

## 2014-09-15 DIAGNOSIS — R05 Cough: Secondary | ICD-10-CM | POA: Diagnosis not present

## 2014-09-15 DIAGNOSIS — Z66 Do not resuscitate: Secondary | ICD-10-CM

## 2014-09-15 NOTE — Patient Instructions (Signed)
Review forms with your son.   No change in medications Chest xray today Return 6 months

## 2014-09-15 NOTE — Assessment & Plan Note (Signed)
History of lung abscess with chronic cavitation right upper lobe that had previously improved Mechanism of this injury was due to chronic aspiration from dysphagia Currently the patient has increased cough but I doubt recurrent abscess in the lung is present Plan Obtain chest x-ray No change in medications at this time

## 2014-09-15 NOTE — Progress Notes (Signed)
Subjective:    Patient ID: Alexander Goodwin, male    DOB: 1920-09-10, 81 y.o.   MRN: [id number]  HPI 81 y.o. 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 4 month follow up    Increased cough with white mucus and dull chest pain and chest tightness worseing over the past several months.  Some wheezing.  Pt notes more cough with white mucus. Notes anterior chest tightness and pain. Pain no worse with deep breath.  No real edema.  Pt with emesis and nausea.  No regurgitation or dysphagia is noted except for water . No real fever currently.  Pt went to urgent care 08/12/14 for bronchitis and Rx ABX for 12days and cough med.  Pt notes some wheezing. Pt c/o weakness and memory is worse     Review of Systems  Constitutional: Positive for fever and fatigue. Negative for chills, diaphoresis, activity change, appetite change and unexpected weight change.  HENT: Positive for congestion, dental problem, hearing loss, postnasal drip, rhinorrhea and voice change. Negative for ear discharge, ear pain, facial swelling, mouth sores, nosebleeds, sinus pressure, sneezing, sore throat, tinnitus and trouble swallowing.   Eyes: Positive for discharge and itching. Negative for photophobia and visual disturbance.  Respiratory: Positive for cough, chest tightness, shortness of breath and wheezing. Negative for apnea, choking and stridor.   Cardiovascular: Negative for chest pain, palpitations and leg swelling.  Gastrointestinal: Negative for nausea, vomiting, abdominal pain, constipation, blood in stool and abdominal distention.  Genitourinary: Positive for frequency. Negative for dysuria, urgency, hematuria, flank pain, decreased urine volume and difficulty urinating.  Musculoskeletal: Negative for myalgias, back pain, joint swelling, arthralgias, gait problem, neck pain and neck stiffness.  Skin: Negative for color change, pallor and rash.   Neurological: Negative for dizziness, tremors, seizures, syncope, speech difficulty, weakness, light-headedness, numbness and headaches.  Hematological: Negative for adenopathy. Bruises/bleeds easily.  Psychiatric/Behavioral: Negative for confusion, sleep disturbance and agitation. The patient is nervous/anxious.        Objective:   Physical Exam Filed Vitals:   09/15/14 1043  BP: 122/64  Pulse: 65  Temp: 97.8 F (36.6 C)  TempSrc: Oral  Height: 5\' 3"  (1.6 m)  Weight: 159 lb 3.2 oz (72.213 kg)  SpO2: 98%    Gen: Pleasant, well-nourished, elderly male  in no distress,  normal affect  ENT: No lesions,  mouth clear,  oropharynx clear, no postnasal drip  Neck: No JVD, no TMG, no carotid bruits  Lungs: No use of accessory muscles, no dullness to percussion, clear  Cardiovascular: RRR, heart sounds normal, no murmur or gallops, no peripheral edema  Abdomen: soft and NT, no HSM,  BS normal  Musculoskeletal: No deformities, no cyanosis or clubbing  Neuro: alert, non focal  Skin: Warm, no lesions or rashes   No results found.    Assessment & Plan:   History of lung abscess now with chronic cavitation right upper lobe secondary to chronic aspiration History of lung abscess with chronic cavitation right upper lobe that had previously improved Mechanism of this injury was due to chronic aspiration from dysphagia Currently the patient has increased cough but I doubt recurrent abscess in the lung is present Plan Obtain chest x-ray No change in medications at this time   DNR (do not resuscitate) Long discussion with the patient and the patient spouse and he agrees to a DO NOT RESUSCITATE status we also filled out the most  form and DO NOT RESUSCITATE out of hospital paperwork and gave this to the patient      Updated Medication List Outpatient Encounter Prescriptions as of 09/15/2014  Medication Sig  . acetaminophen (TYLENOL ARTHRITIS PAIN) 650 MG CR tablet Take 650 mg  by mouth every 8 (eight) hours as needed.    Marland Kitchen amLODipine (NORVASC) 10 MG tablet Take 10 mg by mouth daily.    . AZOPT 1 % ophthalmic suspension Place 1 drop into both eyes 2 (two) times daily.  . benzonatate (TESSALON) 200 MG capsule Take by mouth as needed.  . beta carotene w/minerals (OCUVITE) tablet Take 1 tablet by mouth daily.  . brimonidine (ALPHAGAN P) 0.1 % SOLN Twice daily to both eyes  . calcium carbonate (TUMS - DOSED IN MG ELEMENTAL CALCIUM) 500 MG chewable tablet Chew 1 tablet by mouth as needed for indigestion or heartburn.  . calcium-vitamin D (OSCAL 500/200 D-3) 500-200 MG-UNIT per tablet Take 1 tablet by mouth daily.   Marland Kitchen doxazosin (CARDURA) 8 MG tablet TAKE 1 TABLET BY MOUTH ONCE DAILY  . furosemide (LASIX) 40 MG tablet TAKE 1/2 TABLET BY MOUTH ONCE DAILY  . Iron, Ferrous Gluconate, 256 (28 FE) MG TABS Take 1 tablet by mouth daily.  Marland Kitchen latanoprost (XALATAN) 0.005 % ophthalmic solution Place 1 drop into both eyes 2 (two) times daily.   . Multiple Vitamin (MULTIVITAMIN) tablet Take 1 tablet by mouth daily.  Marland Kitchen omeprazole (PRILOSEC) 20 MG capsule Take 20 mg by mouth daily.

## 2014-09-15 NOTE — Assessment & Plan Note (Signed)
Long discussion with the patient and the patient spouse and he agrees to a DO NOT RESUSCITATE status we also filled out the most form and DO NOT RESUSCITATE out of hospital paperwork and gave this to the patient

## 2014-09-16 ENCOUNTER — Telehealth: Payer: Self-pay | Admitting: Critical Care Medicine

## 2014-09-16 DIAGNOSIS — J189 Pneumonia, unspecified organism: Secondary | ICD-10-CM

## 2014-09-16 MED ORDER — AMOXICILLIN-POT CLAVULANATE 875-125 MG PO TABS: 1.0000 | ORAL_TABLET | Freq: Two times a day (BID) | ORAL | Status: DC

## 2014-09-16 NOTE — Telephone Encounter (Signed)
Mrs Mercadel is calling back. Please call back at [phone]

## 2014-09-16 NOTE — Telephone Encounter (Signed)
I spoke with Dr. Annamaria Boots, who reviewed cxr and records. Per Dr. Annamaria Boots: There is an area of concern of possible pna.  He recs pt take augmentin 875 mg #14 take 1 bid and come back in 2 wks for cxr only dx pneumonia NOS.    Spoke with pt.  He would like [place] to discuss results and recs with his wife.  She is not home at present.  He will ask her to call office back.  Will await call and send to Dr. Bettina Gavia box.

## 2014-09-16 NOTE — Telephone Encounter (Addendum)
Dr. Joya Gaskins not in office until Monday. Dr. Annamaria Boots, will you please advise on cxr from yesterday until Dr. Joya Gaskins returns on Monday?   Thank you.

## 2014-09-16 NOTE — Telephone Encounter (Signed)
Spouse called back and is aware of recs. She wants RX called in and is aware order placed for CXR to be done in 2 weeks. They had no further questions

## 2014-09-17 ENCOUNTER — Ambulatory Visit: Payer: Self-pay | Admitting: Internal Medicine

## 2014-09-17 DIAGNOSIS — I10 Essential (primary) hypertension: Secondary | ICD-10-CM | POA: Diagnosis not present

## 2014-09-17 DIAGNOSIS — S0990XA Unspecified injury of head, initial encounter: Secondary | ICD-10-CM | POA: Diagnosis not present

## 2014-09-17 DIAGNOSIS — Z9181 History of falling: Secondary | ICD-10-CM | POA: Diagnosis not present

## 2014-09-17 DIAGNOSIS — R198 Other specified symptoms and signs involving the digestive system and abdomen: Secondary | ICD-10-CM | POA: Diagnosis not present

## 2014-09-17 DIAGNOSIS — R410 Disorientation, unspecified: Secondary | ICD-10-CM | POA: Diagnosis not present

## 2014-09-20 DIAGNOSIS — H4011X3 Primary open-angle glaucoma, severe stage: Secondary | ICD-10-CM | POA: Diagnosis not present

## 2014-09-20 NOTE — Telephone Encounter (Signed)
pls ask the pt to take probiotics one with each meal three times per day while on augmentin He has hx of C Diff in the past with ABX

## 2014-09-20 NOTE — Telephone Encounter (Signed)
Called spoke with spouse - advised of PW's recommendations regarding a probiotic while taking the Augmentin.  Spouse voiced her understanding and denied any questions.  Nothing further needed; will sign off.

## 2014-09-23 ENCOUNTER — Telehealth: Payer: Self-pay | Admitting: Critical Care Medicine

## 2014-09-23 MED ORDER — AMOXICILLIN-POT CLAVULANATE 875-125 MG PO TABS: 1.0000 | ORAL_TABLET | Freq: Two times a day (BID) | ORAL | Status: DC

## 2014-09-23 NOTE — Telephone Encounter (Signed)
i agree with this plan 

## 2014-09-23 NOTE — Telephone Encounter (Signed)
Pt was given course of Augmentin 875 #14 09/16/14  Pt states that he lost the bottle is not sure how many days he took before he lost them. Needs new Rx Please advise how many days to call into pharmacy. Crystal Damian Leavell, RN at 09/16/2014 2:59 PM     Status: Signed       Expand All Collapse All   I spoke with Dr. Annamaria Boots, who reviewed cxr and records. Per Dr. Annamaria Boots: There is an area of concern of possible pna. He recs pt take augmentin 875 mg #14 take 1 bid and come back in 2 wks for cxr only dx pneumonia NOS.   Spoke with pt. He would like [place] to discuss results and recs with his wife. She is not home at present. He will ask her to call office back. Will await call and send to Dr. Bettina Gavia box.     [facility] [address] in [place]  Please advise Dr Joya Gaskins. Thanks.

## 2014-09-23 NOTE — Telephone Encounter (Signed)
Augmentin 875, # 14, 1 twice daily  Keep return for CXR as previously planned

## 2014-09-23 NOTE — Telephone Encounter (Signed)
Will send to MD of the day as Dr Joya Gaskins is not available.   Dr Annamaria Boots, please advise. Thanks.

## 2014-09-23 NOTE — Telephone Encounter (Signed)
Augmentin sent to [facility]  Pt aware to come in either 09/30/14 or 10/01/14 for cxr as planned.  Nothing further needed.

## 2014-09-30 ENCOUNTER — Ambulatory Visit (INDEPENDENT_AMBULATORY_CARE_PROVIDER_SITE_OTHER)
Admission: RE | Admit: 2014-09-30 | Discharge: 2014-09-30 | Disposition: A | Discharge status: Home / Self Care | Payer: Medicare Other | Source: Ambulatory Visit | Attending: Critical Care Medicine | Admitting: Critical Care Medicine

## 2014-09-30 DIAGNOSIS — J189 Pneumonia, unspecified organism: Secondary | ICD-10-CM | POA: Diagnosis not present

## 2014-11-08 DIAGNOSIS — K219 Gastro-esophageal reflux disease without esophagitis: Secondary | ICD-10-CM | POA: Diagnosis not present

## 2014-11-08 DIAGNOSIS — I10 Essential (primary) hypertension: Secondary | ICD-10-CM | POA: Diagnosis not present

## 2014-11-24 DIAGNOSIS — R300 Dysuria: Secondary | ICD-10-CM | POA: Diagnosis not present

## 2014-11-30 DIAGNOSIS — I10 Essential (primary) hypertension: Secondary | ICD-10-CM | POA: Diagnosis not present

## 2014-11-30 DIAGNOSIS — R5383 Other fatigue: Secondary | ICD-10-CM | POA: Diagnosis not present

## 2014-11-30 DIAGNOSIS — D649 Anemia, unspecified: Secondary | ICD-10-CM | POA: Diagnosis not present

## 2014-11-30 DIAGNOSIS — R410 Disorientation, unspecified: Secondary | ICD-10-CM | POA: Diagnosis not present

## 2014-12-11 NOTE — H&P (Signed)
PATIENT NAME:  Alexander Goodwin, Alexander Goodwin MR#:  [id number] DATE OF BIRTH:  1921-07-20  DATE OF ADMISSION:  10/06/2013  REFERRING PHYSICIAN: Carrie Mew, MD  PRIMARY PULMONOLOGIST: Dr. Joya Gaskins at [place].   CHIEF COMPLAINT: Possible TIA.  HISTORY OF PRESENT ILLNESS: The patient is a pleasant 81 year old male with history of TIA and pancreatitis who comes in from the [place]. Apparently he was sitting at breakfast and the wife who was sitting at the table saw that the patient kind of became less responsive which lasted about 15 minutes. Staff was called who brought the patient down to the floor. The patient did not have a fall. The patient does not remember the event, but per chart someone apparently saw the patient with left-sided facial droop that per wife might not have been there as the husband's face was bent and she did not see it. However, after a few minutes apparently the left-sided facial droop resolved. Currently he is back to his baseline. A CAT scan of the head is negative. However, x-ray of the chest shows possible pneumonia and hospitalist services were contacted for further evaluation and management.   PAST MEDICAL HISTORY: TIA, pancreatitis, cholecystectomy. There is also a history of some kind of a lung issue which the family could not elaborate much, but it is in the right upper lobe and he did have a bleed once and per wife also had food going into the lung at one point and he was not a surgical candidate. They could not elaborate more and we have no other records here.   FAMILY HISTORY: Cancer, stroke, diabetes, hypertension.   OUTPATIENT MEDICATIONS: Tylenol 650 mg every 8 hours as needed for pain, Alphagan P 0.1% ophthalmic solution 3 times a day to the right eye and 1 drop to left eye 2 times a day, amlodipine 10 mg daily, dorzolamide/timolol 2%/0.5% 1 drop to each eye 2 times a day, doxazosin 8 mg once a day, famotidine 40 mg daily, furosemide 20 mg once a day as needed,  latanoprost 0.005% ophthalmic solution 1 drop to each eye once a day, multivitamin 1 tab daily, Os-Cal calcium plus D 500/200 international unit 2 times a day, tramadol 50 mg 3 times a day as needed for pain.   ALLERGIES: ERYTHROMYCIN BASE AND HYDROCODONE.   SOCIAL HISTORY: Ex-smoker, quit multiple decades ago. No alcohol. No drugs. Lives at [place].  REVIEW OF SYSTEMS:  CONSTITUTIONAL: No fever, fatigue, weakness, or weight changes.  EYES: No blurry vision or double vision.  ENT: No tinnitus or hearing loss.  RESPIRATORY: Has been having occasional cough with yellowish sputum for the past 2 or 3 weeks. Occasional wheezing. No shortness of breath or COPD. No painful respirations. Chronic lung issue of the right upper lobe, as described above.  GASTROINTESTINAL: No nausea, vomiting, diarrhea. Has some kind of an esophageal issue. Did not know if this is a fistula or aspiration, but the patient denies having any difficulty swallowing, coughing up blood, diarrhea, abdominal pain, or bloody stools.  GENITOURINARY: Denies dysuria, hematuria. HEME AND LYMPH: No anemia or easy bruising.  SKIN: Denies any rashes.  MUSCULOSKELETAL: Denies arthritis or gout.  NEUROLOGIC: Denies focal weakness or numbness. Has a history of TIA. PSYCHIATRIC: Denies anxiety or depression.   PHYSICAL EXAMINATION: VITAL SIGNS: Temperature on arrival 98, pulse rate 65, respiratory rate 18, blood pressure 153/66, O2 sat 96%.  GENERAL: The patient is an elderly male lying in bed, no obvious distress, talking in full sentences.  HEENT:  Normocephalic, atraumatic. Pupils are equal and reactive. Extraocular muscles intact. Moist mucous membranes.  NECK: Supple. No thyroid tenderness. No cervical lymphadenopathy.  CARDIOVASCULAR: S1 and S2 regular without significant murmurs, rubs, or gallops.  LUNGS: Clear to auscultation without significant wheezing or rhonchi heard.  ABDOMEN: Soft, nontender, nondistended.  Positive bowel sounds in all quadrants.  EXTREMITIES: No pitting edema.  NEUROLOGIC: Cranial nerves II through XII grossly intact. Strength is 5 out of 5 in all extremities. Sensation is intact to light touch.  PSYCHIATRIC: Awake, alert, and oriented x3.   LABORATORY AND DIAGNOSTICS: X-ray of the chest shows increased density in the right upper lobe, likely related to acute pneumonic infiltrate.   UA not suggestive of infection. White count 5.5, hemoglobin 11.6, platelets 177. Troponin negative. LFTs showed albumin of 3.1, otherwise within normal limits. BUN 23, creatinine 1.34, sodium 140, potassium 3.9.   EKG: Normal sinus rhythm, rate is 67. No acute ST changes.   ASSESSMENT AND PLAN: We have a 81 year old with transient ischemic attack, pancreatitis, cholecystectomy, and some kind of a chronic right upper lobe lung issue which they could not elaborate further. I do not know if this is an pulmo-esophageal fistula or something of the like, but comes in for an episode of decreased responsiveness and possible left-sided facial droop which have all resolved and the patient is back to baseline.   It is possible the patient experienced a transient ischemic attack given the facial droop and the decreased responsiveness. The patient had a CAT scan of the head which was negative and his symptoms have all resolved. We would investigate the patient further via carotid ultrasound, an echocardiogram, and remote telemetry. Would do frequent neuro checks and as he has completely gone back to normal, I would defer on MRI at this point. Would check a lipid profile and a TSH for risk stratification and follow the patient clinically. In regards to the right upper lobe, per wife, this might be a chronic issue and I do not think that this is acute pneumonia. This could be an aspiration type of a thing and could also have some kind of esophageal pulmonary fistula as the wife said that he has had food particle going to the  lung through the esophagus. It is also possible that he is aspirating. However, at this point, he has no fever, he is not hypoxic, and he has no tachycardia significantly nor does he have any leukocytosis. He has been given Levaquin which I can continue skipping sputum cultures; however, if this is all chronic I would rather not give him antibiotics at this time. For better visualization, I would obtain a CAT scan of the chest. We would obtain a physical therapy consult as well as a swallow consult and put him on puree diet given the concern for possible aspiration. This could be more of an aspiration pneumonitis also.   CODE STATUS: The patient is DO NOT RESUSCITATE.   TOTAL TIME SPENT: 45 minutes.   ___________________________ Vivien Presto, MD sa:sb D: 10/06/2013 12:41:41 ET T: 10/06/2013 13:13:27 ET JOB#: [id number]  cc: Vivien Presto, MD, <Dictator> Vivien Presto MD ELECTRONICALLY SIGNED 10/23/2013 13:07

## 2014-12-11 NOTE — Discharge Summary (Signed)
PATIENT NAME:  Alexander Goodwin, CLEARMAN MR#:  [id number] DATE OF BIRTH:  [birth year]  DATE OF ADMISSION:  10/06/2013 DATE OF DISCHARGE:  10/07/2013  For a detailed note, please go to the history and physical done on admission by Dr. Bridgette Habermann.  DIAGNOSES AT DISCHARGE: 1.  Suspected transient ischemic attack, although not ruled out. 2.  History of chronic aspiration pneumonitis.  3.  Hypertension.  4.  Benign prostatic hypertrophy. 5.  Glaucoma.   DIET: The patient is being discharged on a low-sodium, regular consistency diet with thin liquids with aspiration precautions.   ACTIVITY: As tolerated.  FOLLOWUP:  Dr. Joya Gaskins with [facility] in the next 1 to 2 weeks.  DISCHARGE MEDICATIONS:  As follows: Alphagan 0.1% ophthalmic solution 1 drop to the right eye t.i.d. and 1 drop to left eye b.i.d., dorzolamide timolol eye drops 1 drop to each eye b.i.d., latanoprost 0.005% ophthalmic solution at bedtime, amlodipine 10 mg daily, doxazosin 8 mg daily, Lasix 40 mg daily as needed, tramadol 50 mg t.i.d. as needed, Tylenol 650 q. 8 hours as needed, Pepcid 40 mg daily, Os-Cal with vitamin D 1 tab b.i.d., multivitamin daily.   PERTINENT STUDIES DONE DURING THE HOSPITAL COURSE:  Are as follows: A CT scan of the head done without contrast on admission showing chronic changes without acute abnormality. An ultrasound of the carotids showing no evidence of any hemodynamically significant carotid artery stenosis. A 2-dimensional echocardiogram done showing ejection fraction of 65%, normal global LV systolic function, moderately dilated right atrium and moderately dilated left atrium.   Mild to moderate aortic valve sclerosis without any evidence of aortic stenosis. No mitral valve prolapse, mild mitral annular calcification. A CT of the chest done without contrast showing masslike density with spiculation lateral to the right hilum measuring 4.2 to 4.6 cm causing narrowing of the adjacent bronchus suggestive of postobstructive  atelectasis, bilateral upper lobe scarring with masslike density along the scarring in the right upper lobe measuring 2.3 x 3.8 cm with minimal spiculation, nodular airspace opacification in the periphery of the right upper lobe suggesting a post obstructive pneumonitis versus acute infection, no significant axillary lymphadenopathy. Findings are concerning for neoplastic process.   HOSPITAL COURSE: This is a 81 year old male with medical problems as mentioned above, presented to the hospital with an episode where he sort of slumped over suspected for possible TIA.   PROBLEM: 1.  Suspected transient ischemic attack. The patient presented with an episode where he had transient loss of his neurologic symptoms and they improved. The patient had a CT head on admission, which was negative. He had a nonfocal neurological exam. He had a workup including carotid duplex and echocardiogram which was negative. We opted not to do an MRI as it would not change our management at this time. The patient was seen by speech. They thought he was tolerating oral intake well without any evidence of aspiration. The patient therefore is being discharged home with close followup with his primary care physician. He will continue his aspirin. He had a lipid panel checked which was at goal and he is already on a statin.  2.  History of glaucoma. The patient was maintained on his dorzolamide, timolol and latanoprost eye drops. He will resume that. 3.  History of chronic aspiration pneumonitis. The patient apparently has a chronic lung problem, follows up at [facility] pulmonary. The patient and the family do not know the exact etiology of what is really going on in his lungs. There is some concern for possible  underlying pneumonia versus pneumonitis. He is already on chronic antibiotics, which he will continue. He did have a CT chest done here with contrast, which showed multiple small masses consistent with postobstructive pneumonitis  versus an underlying malignant process. Clinically the patient is doing fine; therefore, he is going to follow up with his pulmonologist as an outpatient. He was given the CT of his CAT scan to give to his pulmonologist to compare from his previous studies that he had done.  The family was in agreement with this plan.  4.  Hypertension. The patient was maintained on his Norvasc. He will resume that. 5.  Benign prostatic hypertrophy. The patient was maintained on his oxygen Zosyn and he will resume that upon discharge too.   CODE STATUS: The patient is a DNI/DNR.  TIME SPENT: 40 minutes   ____________________________ Belia Heman. Verdell Carmine, MD vjs:ce D: 10/07/2013 16:20:38 ET T: 10/07/2013 18:39:50 ET JOB#: [id number]  cc: Belia Heman. Verdell Carmine, MD, <Dictator> Dr. Joya Gaskins at [facility] SIGNED 10/16/2013 18:04

## 2015-01-13 DIAGNOSIS — K219 Gastro-esophageal reflux disease without esophagitis: Secondary | ICD-10-CM | POA: Diagnosis not present

## 2015-02-06 ENCOUNTER — Observation Stay
Admission: EM | Admit: 2015-02-06 | Discharge: 2015-02-07 | Disposition: A | Discharge status: Home / Self Care | Payer: Medicare Other | Attending: Internal Medicine | Admitting: Internal Medicine

## 2015-02-06 ENCOUNTER — Encounter: Payer: Self-pay | Admitting: Emergency Medicine

## 2015-02-06 ENCOUNTER — Emergency Department: Payer: Medicare Other

## 2015-02-06 DIAGNOSIS — I739 Peripheral vascular disease, unspecified: Secondary | ICD-10-CM | POA: Insufficient documentation

## 2015-02-06 DIAGNOSIS — Z87891 Personal history of nicotine dependence: Secondary | ICD-10-CM | POA: Insufficient documentation

## 2015-02-06 DIAGNOSIS — Z825 Family history of asthma and other chronic lower respiratory diseases: Secondary | ICD-10-CM | POA: Insufficient documentation

## 2015-02-06 DIAGNOSIS — R51 Headache: Secondary | ICD-10-CM | POA: Diagnosis not present

## 2015-02-06 DIAGNOSIS — Z79899 Other long term (current) drug therapy: Secondary | ICD-10-CM | POA: Diagnosis not present

## 2015-02-06 DIAGNOSIS — J852 Abscess of lung without pneumonia: Secondary | ICD-10-CM | POA: Insufficient documentation

## 2015-02-06 DIAGNOSIS — Z8261 Family history of arthritis: Secondary | ICD-10-CM | POA: Diagnosis not present

## 2015-02-06 DIAGNOSIS — N400 Enlarged prostate without lower urinary tract symptoms: Secondary | ICD-10-CM | POA: Diagnosis not present

## 2015-02-06 DIAGNOSIS — J988 Other specified respiratory disorders: Secondary | ICD-10-CM | POA: Insufficient documentation

## 2015-02-06 DIAGNOSIS — J329 Chronic sinusitis, unspecified: Secondary | ICD-10-CM | POA: Insufficient documentation

## 2015-02-06 DIAGNOSIS — E785 Hyperlipidemia, unspecified: Secondary | ICD-10-CM | POA: Insufficient documentation

## 2015-02-06 DIAGNOSIS — Z8049 Family history of malignant neoplasm of other genital organs: Secondary | ICD-10-CM | POA: Diagnosis not present

## 2015-02-06 DIAGNOSIS — K219 Gastro-esophageal reflux disease without esophagitis: Secondary | ICD-10-CM | POA: Insufficient documentation

## 2015-02-06 DIAGNOSIS — R1312 Dysphagia, oropharyngeal phase: Secondary | ICD-10-CM | POA: Insufficient documentation

## 2015-02-06 DIAGNOSIS — Z9049 Acquired absence of other specified parts of digestive tract: Secondary | ICD-10-CM | POA: Diagnosis not present

## 2015-02-06 DIAGNOSIS — Z8673 Personal history of transient ischemic attack (TIA), and cerebral infarction without residual deficits: Secondary | ICD-10-CM | POA: Diagnosis not present

## 2015-02-06 DIAGNOSIS — H409 Unspecified glaucoma: Secondary | ICD-10-CM | POA: Insufficient documentation

## 2015-02-06 DIAGNOSIS — I10 Essential (primary) hypertension: Secondary | ICD-10-CM | POA: Diagnosis not present

## 2015-02-06 DIAGNOSIS — R1310 Dysphagia, unspecified: Secondary | ICD-10-CM | POA: Insufficient documentation

## 2015-02-06 DIAGNOSIS — R911 Solitary pulmonary nodule: Secondary | ICD-10-CM | POA: Diagnosis not present

## 2015-02-06 DIAGNOSIS — Z881 Allergy status to other antibiotic agents status: Secondary | ICD-10-CM | POA: Insufficient documentation

## 2015-02-06 DIAGNOSIS — Z800 Family history of malignant neoplasm of digestive organs: Secondary | ICD-10-CM | POA: Diagnosis not present

## 2015-02-06 DIAGNOSIS — Z833 Family history of diabetes mellitus: Secondary | ICD-10-CM | POA: Diagnosis not present

## 2015-02-06 DIAGNOSIS — Z86010 Personal history of colonic polyps: Secondary | ICD-10-CM | POA: Insufficient documentation

## 2015-02-06 DIAGNOSIS — Z8249 Family history of ischemic heart disease and other diseases of the circulatory system: Secondary | ICD-10-CM | POA: Diagnosis not present

## 2015-02-06 DIAGNOSIS — I160 Hypertensive urgency: Secondary | ICD-10-CM | POA: Diagnosis present

## 2015-02-06 DIAGNOSIS — I161 Hypertensive emergency: Secondary | ICD-10-CM

## 2015-02-06 LAB — URINALYSIS COMPLETE WITH MICROSCOPIC (ARMC ONLY)
BACTERIA UA: NONE SEEN
Bilirubin Urine: NEGATIVE
GLUCOSE, UA: NEGATIVE mg/dL
HGB URINE DIPSTICK: NEGATIVE
Ketones, ur: NEGATIVE mg/dL
LEUKOCYTES UA: NEGATIVE
Nitrite: NEGATIVE
PH: 7 (ref 5.0–8.0)
Protein, ur: NEGATIVE mg/dL
Specific Gravity, Urine: 1.003 — ABNORMAL LOW (ref 1.005–1.030)
Squamous Epithelial / LPF: NONE SEEN

## 2015-02-06 LAB — COMPREHENSIVE METABOLIC PANEL
ALK PHOS: 100 U/L (ref 38–126)
ALT: 13 U/L — AB (ref 17–63)
ANION GAP: 5 (ref 5–15)
AST: 16 U/L (ref 15–41)
Albumin: 3.7 g/dL (ref 3.5–5.0)
BILIRUBIN TOTAL: 0.5 mg/dL (ref 0.3–1.2)
BUN: 23 mg/dL — AB (ref 6–20)
CALCIUM: 9.9 mg/dL (ref 8.9–10.3)
CO2: 29 mmol/L (ref 22–32)
CREATININE: 1.11 mg/dL (ref 0.61–1.24)
Chloride: 107 mmol/L (ref 101–111)
GFR calc Af Amer: >60 mL/min (ref >60)
GFR, EST NON AFRICAN AMERICAN: 56 mL/min — AB (ref >60)
Glucose, Bld: 107 mg/dL — ABNORMAL HIGH (ref 65–99)
Potassium: 4 mmol/L (ref 3.5–5.1)
Sodium: 141 mmol/L (ref 135–145)
Total Protein: 7.1 g/dL (ref 6.5–8.1)

## 2015-02-06 LAB — PROTIME-INR
INR: 1.06
PROTHROMBIN TIME: 14 s (ref 11.4–15.0)

## 2015-02-06 LAB — DIFFERENTIAL
BASOS ABS: 0 10*3/uL (ref 0–0.1)
BASOS PCT: 0 %
EOS PCT: 3 %
Eosinophils Absolute: 0.2 10*3/uL (ref 0–0.7)
Lymphocytes Relative: 25 %
Lymphs Abs: 1.5 10*3/uL (ref 1.0–3.6)
MONOS PCT: 9 %
Monocytes Absolute: 0.6 10*3/uL (ref 0.2–1.0)
NEUTROS PCT: 63 %
Neutro Abs: 3.7 10*3/uL (ref 1.4–6.5)

## 2015-02-06 LAB — CBC
HCT: 38.1 % — ABNORMAL LOW (ref 40.0–52.0)
Hemoglobin: 12.6 g/dL — ABNORMAL LOW (ref 13.0–18.0)
MCH: 32 pg (ref 26.0–34.0)
MCHC: 33.1 g/dL (ref 32.0–36.0)
MCV: 96.7 fL (ref 80.0–100.0)
PLATELETS: 188 10*3/uL (ref 150–440)
RBC: 3.94 MIL/uL — AB (ref 4.40–5.90)
RDW: 15.6 % — ABNORMAL HIGH (ref 11.5–14.5)
WBC: 6 10*3/uL (ref 3.8–10.6)

## 2015-02-06 LAB — URINE DRUG SCREEN, QUALITATIVE (ARMC ONLY)
Amphetamines, Ur Screen: NOT DETECTED
BARBITURATES, UR SCREEN: NOT DETECTED
Benzodiazepine, Ur Scrn: NOT DETECTED
CANNABINOID 50 NG, UR ~~LOC~~: NOT DETECTED
COCAINE METABOLITE, UR ~~LOC~~: NOT DETECTED
MDMA (Ecstasy)Ur Screen: NOT DETECTED
Methadone Scn, Ur: NOT DETECTED
OPIATE, UR SCREEN: NOT DETECTED
PHENCYCLIDINE (PCP) UR S: NOT DETECTED
Tricyclic, Ur Screen: NOT DETECTED

## 2015-02-06 LAB — ETHANOL

## 2015-02-06 LAB — TROPONIN I: Troponin I: <0.03 ng/mL (ref <0.031)

## 2015-02-06 LAB — APTT: aPTT: 37 seconds — ABNORMAL HIGH (ref 24–36)

## 2015-02-06 MED ORDER — ONDANSETRON HCL 4 MG PO TABS: 4.0000 mg | ORAL_TABLET | Freq: Four times a day (QID) | ORAL | Status: DC | PRN

## 2015-02-06 MED ORDER — MORPHINE SULFATE 2 MG/ML IJ SOLN: 2.0000 mg | INTRAMUSCULAR | Status: DC | PRN

## 2015-02-06 MED ORDER — HYDRALAZINE HCL 20 MG/ML IJ SOLN
10.0000 mg | INTRAMUSCULAR | Status: AC
  Administered 2015-02-06: 10 mg via INTRAVENOUS

## 2015-02-06 MED ORDER — ASPIRIN 81 MG PO CHEW
CHEWABLE_TABLET | ORAL | Status: AC
  Administered 2015-02-06: 162 mg via ORAL
  Filled 2015-02-06: qty 2

## 2015-02-06 MED ORDER — HEPARIN SODIUM (PORCINE) 5000 UNIT/ML IJ SOLN
5000.0000 [IU] | Freq: Three times a day (TID) | INTRAMUSCULAR | Status: DC
  Administered 2015-02-07 (×2): 5000 [IU] via SUBCUTANEOUS
  Filled 2015-02-06 (×2): qty 1

## 2015-02-06 MED ORDER — ASPIRIN 81 MG PO CHEW
162.0000 mg | CHEWABLE_TABLET | Freq: Once | ORAL | Status: AC
  Administered 2015-02-06: 162 mg via ORAL

## 2015-02-06 MED ORDER — ACETAMINOPHEN 650 MG RE SUPP
650.0000 mg | Freq: Four times a day (QID) | RECTAL | Status: DC | PRN
Start: 2015-02-06 — End: 2015-02-07

## 2015-02-06 MED ORDER — SODIUM CHLORIDE 0.9 % IJ SOLN
3.0000 mL | Freq: Two times a day (BID) | INTRAMUSCULAR | Status: DC
  Administered 2015-02-07 (×2): 3 mL via INTRAVENOUS

## 2015-02-06 MED ORDER — HYDRALAZINE HCL 20 MG/ML IJ SOLN
INTRAMUSCULAR | Status: AC
  Administered 2015-02-06: 10 mg via INTRAVENOUS
  Filled 2015-02-06: qty 1

## 2015-02-06 MED ORDER — ONDANSETRON HCL 4 MG/2ML IJ SOLN: 4.0000 mg | Freq: Four times a day (QID) | INTRAMUSCULAR | Status: DC | PRN

## 2015-02-06 MED ORDER — HYDRALAZINE HCL 20 MG/ML IJ SOLN
10.0000 mg | INTRAMUSCULAR | Status: DC | PRN
  Administered 2015-02-07: 10 mg via INTRAVENOUS
  Filled 2015-02-06: qty 1

## 2015-02-06 MED ORDER — ACETAMINOPHEN 325 MG PO TABS: 650.0000 mg | ORAL_TABLET | Freq: Four times a day (QID) | ORAL | Status: DC | PRN

## 2015-02-06 NOTE — H&P (Signed)
[facility] NAME: Alexander Goodwin    MR#:  [id number]  DATE OF BIRTH:  [birth year]   DATE OF ADMISSION:  02/06/2015  PRIMARY CARE PHYSICIAN: Tracie Harrier, MD   REQUESTING/REFERRING PHYSICIAN: Quale  CHIEF COMPLAINT:   Chief Complaint  Patient presents with  . Hypertension   headache  HISTORY OF PRESENT ILLNESS:  Alexander Goodwin  is a 81 y.o. male with a known history of hypertension essential, hyperlipidemia unspecified presenting with headache. Patient had his nursing facility woke up from a nap noted be slightly confused complaining of a headache at that time unable to further classify his headache, but denied any vision problems. They took his blood pressure at the time noted to be elevated with systolic around 175 and less brought to the Hospital further workup and evaluation. Received hydralazine for his blood pressure which is subsequent to improve his symptoms have resolved.  PAST MEDICAL HISTORY:   Past Medical History  Diagnosis Date  . Diverticulitis of colon   . History of colon polyps   . Arthritis     right knee, hips, neck. He denies any major limitation in activities  . GERD (gastroesophageal reflux disease)     well controlled with medication. No nocturnal symptoms on a regular basis  . Glaucoma     both eyes, followed closely by Dr. Janyth Contes  . Esophagitis     Last EGD Fall '11   . Abscess of lung without pneumonia     Right upper lobe chronic lung abscess and prior left upper lobe lung abscess due to chronic aspiration  . Pancreatitis     resolved after GB surgery  . Stroke     TIAs - age 11  . BPH (benign prostatic hypertrophy) with urinary obstruction   . Hyperlipemia   . Hypertension   . Seasonal allergies   . Dysphagia, oropharyngeal     PAST SURGICAL HISTORY:   Past Surgical History  Procedure Laterality Date  . Cholecystectomy      '88 - laparotomy ([facility])  . Appendectomy       '88 along with GB  . Bronchoscopy  1993  . Video bronchoscopy  03/26/2012    Procedure: VIDEO BRONCHOSCOPY WITH FLUORO;  Surgeon: Elsie Stain, MD;  Location: [facility];  Service: Cardiopulmonary;  Laterality: Bilateral;    SOCIAL HISTORY:   History  Substance Use Topics  . Smoking status: Former Smoker -- 0.50 packs/day for 30 years    Types: Pipe, Cigars    Quit date: 08/20/1978  . Smokeless tobacco: Former Systems developer    Types: Chew    Quit date: 11/16/1990  . Alcohol Use: No    FAMILY HISTORY:   Family History  Problem Relation Age of Onset  . Diabetes Mother   . Hypertension Mother   . Heart disease Mother   . Stroke Father   . Heart disease Father   . Diabetes Father   . Cancer Sister     uterine cancer  . Cancer Brother   . COPD Brother   . Hypertension Brother   . Cancer Brother   . Cancer Sister     gyn malignancy  . Cancer Sister     pancreatic cancer  . Allergies Sister   . Allergies Son   . Rheum arthritis Father   . Asthma Sister   . Asthma Son     DRUG ALLERGIES:   Allergies  Allergen Reactions  . Clindamycin/Lincomycin  itching    REVIEW OF SYSTEMS:  REVIEW OF SYSTEMS:  CONSTITUTIONAL: Denies fevers, chills, fatigue, weakness.  EYES: Denies blurred vision, double vision, or eye pain.  EARS, NOSE, THROAT: Denies tinnitus, ear pain, hearing loss.  RESPIRATORY: denies cough, shortness of breath, wheezing  CARDIOVASCULAR: Denies chest pain, palpitations, edema.  GASTROINTESTINAL: Denies nausea, vomiting, diarrhea, abdominal pain.  GENITOURINARY: Denies dysuria, hematuria.  ENDOCRINE: Denies nocturia or thyroid problems. HEMATOLOGIC AND LYMPHATIC: Denies easy bruising or bleeding.  SKIN: Denies rash or lesions.  MUSCULOSKELETAL: Denies pain in neck, back, shoulder, knees, hips, or further arthritic symptoms.  NEUROLOGIC: Denies paralysis, paresthesias. Positive headache as above PSYCHIATRIC: Denies anxiety or depressive  symptoms. Otherwise full review of systems performed by me is negative.   MEDICATIONS AT HOME:   Prior to Admission medications   Medication Sig Start Date End Date Taking? Authorizing Provider  acetaminophen (TYLENOL ARTHRITIS PAIN) 650 MG CR tablet Take 650 mg by mouth every 8 (eight) hours as needed.     Yes Historical Provider, MD  amLODipine (NORVASC) 10 MG tablet Take 10 mg by mouth daily.     Yes Historical Provider, MD  AZOPT 1 % ophthalmic suspension Place 1 drop into both eyes 2 (two) times daily.   Yes Historical Provider, MD  benzonatate (TESSALON) 200 MG capsule Take by mouth as needed.   Yes Historical Provider, MD  beta carotene w/minerals (OCUVITE) tablet Take 1 tablet by mouth daily.   Yes Historical Provider, MD  brimonidine (ALPHAGAN P) 0.1 % SOLN Place 1 drop into the right eye 3 (three) times daily.    Yes Historical Provider, MD  calcium carbonate (TUMS - DOSED IN MG ELEMENTAL CALCIUM) 500 MG chewable tablet Chew 1 tablet by mouth as needed for indigestion or heartburn.   Yes Historical Provider, MD  calcium-vitamin D (OSCAL 500/200 D-3) 500-200 MG-UNIT per tablet Take 1 tablet by mouth daily.    Yes Historical Provider, MD  dorzolamide-timolol (COSOPT) 22.3-6.8 MG/ML ophthalmic solution Place 1 drop into both eyes 2 (two) times daily.   Yes Historical Provider, MD  doxazosin (CARDURA) 8 MG tablet TAKE 1 TABLET BY MOUTH ONCE DAILY 10/07/12  Yes Neena Rhymes, MD  furosemide (LASIX) 40 MG tablet TAKE 1/2 TABLET BY MOUTH ONCE DAILY 01/13/13  Yes Neena Rhymes, MD  gabapentin (NEURONTIN) 300 MG capsule Take 300 mg by mouth 2 (two) times daily as needed (for shingles).   Yes Historical Provider, MD  Iron, Ferrous Gluconate, 256 (28 FE) MG TABS Take 1 tablet by mouth daily.   Yes Historical Provider, MD  latanoprost (XALATAN) 0.005 % ophthalmic solution Place 1 drop into both eyes 2 (two) times daily.    Yes Historical Provider, MD  omeprazole (PRILOSEC) 20 MG capsule Take  20 mg by mouth daily.   Yes Historical Provider, MD  sucralfate (CARAFATE) 1 G tablet Take 1 tablet by mouth 2 (two) times daily before a meal. 01/17/15  Yes Historical Provider, MD      VITAL SIGNS:  Blood pressure 146/76, pulse 55, temperature 97.6 F (36.4 C), temperature source Oral, resp. rate 15, height 5\' 4"  (1.626 m), weight 154 lb (69.854 kg), SpO2 100 %.  PHYSICAL EXAMINATION:  VITAL SIGNS: Filed Vitals:   02/06/15 2230  BP: 146/76  Pulse: 55  Temp:   Resp: 15   GENERAL:81 y.o.male currently in no acute distress.  HEAD: Normocephalic, atraumatic.  EYES: Pupils equal, round, reactive to light. Extraocular muscles intact. No scleral icterus.  MOUTH: Moist  mucosal membrane. Dentition intact. No abscess noted.  EAR, NOSE, THROAT: Clear without exudates. No external lesions.  NECK: Supple. No thyromegaly. No nodules. No JVD.  PULMONARY: Clear to ascultation, without wheeze rails or rhonci. No use of accessory muscles, Good respiratory effort. good air entry bilaterally CHEST: Nontender to palpation.  CARDIOVASCULAR: S1 and S2. Regular rate and rhythm. No murmurs, rubs, or gallops. No edema. Pedal pulses 2+ bilaterally.  GASTROINTESTINAL: Soft, nontender, nondistended. No masses. Positive bowel sounds. No hepatosplenomegaly.  MUSCULOSKELETAL: No swelling, clubbing, or edema. Range of motion full in all extremities.  NEUROLOGIC: Cranial nerves II through XII are intact. No gross focal neurological deficits. Sensation intact. Reflexes intact.  SKIN: No ulceration, lesions, rashes, or cyanosis. Skin warm and dry. Turgor intact.  PSYCHIATRIC: Mood, affect within normal limits. The patient is awake, alert and oriented x 3. Insight, judgment intact.    LABORATORY PANEL:   CBC  Recent Labs Lab 02/06/15 2034  WBC 6.0  HGB 12.6*  HCT 38.1*  PLT 188    ------------------------------------------------------------------------------------------------------------------  Chemistries   Recent Labs Lab 02/06/15 2034  NA 141  K 4.0  CL 107  CO2 29  GLUCOSE 107*  BUN 23*  CREATININE 1.11  CALCIUM 9.9  AST 16  ALT 13*  ALKPHOS 100  BILITOT 0.5   ------------------------------------------------------------------------------------------------------------------  Cardiac Enzymes  Recent Labs Lab 02/06/15 2034  TROPONINI <0.03   ------------------------------------------------------------------------------------------------------------------  RADIOLOGY:  Ct Head Wo Contrast  02/06/2015   CLINICAL DATA:  Elevated blood pressure with headache.  EXAM: CT HEAD WITHOUT CONTRAST  TECHNIQUE: Contiguous axial images were obtained from the base of the skull through the vertex without intravenous contrast.  COMPARISON:  09/17/2014  FINDINGS: Skull and Sinuses:Negative for fracture or destructive process.  Patchy inflammatory mucosal thickening in the bilateral paranasal sinuses. No sinus effusion.  Orbits: Bilateral cataract resection.  No acute findings.  Brain: No evidence of acute infarction, hemorrhage, hydrocephalus, or mass lesion/mass effect. There is generalized atrophy with mild for age small-vessel ischemic gliosis around the lateral ventricles.  IMPRESSION: 1. No acute findings. 2. Brain atrophy and mild small vessel disease. 3. Chronic sinusitis.   Electronically Signed   By: Monte Fantasia M.D.   On: 02/06/2015 20:58    EKG:   Orders placed or performed during the hospital encounter of 02/06/15  . ED EKG  . ED EKG    IMPRESSION AND PLAN:   81 year old gentleman history of hypertension essential presenting with headache.  1. Hypertensive urgency: Fortunately blood pressure is improved since presentation to the hospital, place on telemetry and follow blood pressure trend, add when necessary hydralazine systolic greater than  595 diastolic greater than 638, restart home medications  2. GERD without esophagitis: PPI therapy 3. BPH: Cardura 4. Venous thromboembolism prophylactic: Heparin subcutaneous    All the records are reviewed and case discussed with ED provider. Management plans discussed with the patient, family and they are in agreement.  CODE STATUS: DO NOT RESUSCITATE  TOTAL TIME TAKING CARE OF THIS PATIENT: 35 minutes.    Brailee Riede,  Karenann Cai.D on 02/06/2015 at 11:03 PM  Between 7am to 6pm - Pager - [phone]  After 6pm: House Pager: - [phone]  [facility]  Office  [phone]  CC: Primary care physician; Tracie Harrier, MD

## 2015-02-06 NOTE — ED Provider Notes (Signed)
----------------------------------------- 8:38 PM on 02/06/2015 -----------------------------------------  [facility] Emergency Department Provider Note  ____________________________________________  Time seen: Approximately 8:38 PM  I have reviewed the triage vital signs and the nursing notes.  Story as per his son and patient in concert. HISTORY  Chief Complaint Hypertension    HPI Alexander Goodwin is a 81 y.o. male who took a nap roughly around 2 PM today. On awakening from his nap at 5 PM the patient's speech seems slightly off per family, in addition he is confused. Nurse checked and his blood pressure was significantly elevated. Family reports patient has a history of "TIAs" in the past that it presented with elevated blood pressure similar.  The patient denies any chest pain or shortness of breath. He states he has had a mild headache off and on today. He denies any numbness or tingling. Family and patient state his speech is back to normal and confusion appears to be slightly improved, though he is not able to recall clearly all of the events of today.  He denies fevers or chills. No recent illness. No chest pain. No numbness or timing. No difficulty walking. No weakness in one arm or leg. He has not had any facial droop.  Headache described as mild throbbing. Bifrontal. No associated nausea or vomiting. No pain in his eyes. No neck pain.  Past Medical History  Diagnosis Date  . Diverticulitis of colon   . History of colon polyps   . Arthritis     right knee, hips, neck. He denies any major limitation in activities  . GERD (gastroesophageal reflux disease)     well controlled with medication. No nocturnal symptoms on a regular basis  . Glaucoma     both eyes, followed closely by Dr. Janyth Contes  . Esophagitis     Last EGD Fall '11   . Abscess of lung without pneumonia     Right upper lobe chronic lung abscess and prior left upper lobe lung abscess  due to chronic aspiration  . Pancreatitis     resolved after GB surgery  . Stroke     TIAs - age 38  . BPH (benign prostatic hypertrophy) with urinary obstruction   . Hyperlipemia   . Hypertension   . Seasonal allergies   . Dysphagia, oropharyngeal     Patient Active Problem List   Diagnosis Date Noted  . DNR (do not resuscitate) 09/15/2014  . H/O transient cerebral ischemia 04/05/2014  . 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 Hyperlipemia   . Hypertension   . History of colon polyps   . Arthritis   . GERD (gastroesophageal reflux disease)   . Glaucoma   . Esophagitis   . Solitary lung nodule   . Stroke   . BPH (benign prostatic hypertrophy) with urinary obstruction     Past Surgical History  Procedure Laterality Date  . Cholecystectomy      '88 - laparotomy ([facility])  . Appendectomy      '88 along with GB  . Bronchoscopy  1993  . Video bronchoscopy  03/26/2012    Procedure: VIDEO BRONCHOSCOPY WITH FLUORO;  Surgeon: Elsie Stain, MD;  Location: [facility];  Service: Cardiopulmonary;  Laterality: Bilateral;    Current Outpatient Rx  Name  Route  Sig  Dispense  Refill  . acetaminophen (TYLENOL ARTHRITIS PAIN) 650 MG CR tablet   Oral   Take 650 mg by mouth  every 8 (eight) hours as needed.           Marland Kitchen amLODipine (NORVASC) 10 MG tablet   Oral   Take 10 mg by mouth daily.           Marland Kitchen amoxicillin-clavulanate (AUGMENTIN) 875-125 MG per tablet   Oral   Take 1 tablet by mouth 2 (two) times daily.   14 tablet   0   . amoxicillin-clavulanate (AUGMENTIN) 875-125 MG per tablet   Oral   Take 1 tablet by mouth 2 (two) times daily.   14 tablet   0   . AZOPT 1 % ophthalmic suspension   Both Eyes   Place 1 drop into both eyes 2 (two) times daily.         . benzonatate (TESSALON) 200 MG capsule   Oral   Take by mouth as needed.         . beta carotene w/minerals (OCUVITE) tablet    Oral   Take 1 tablet by mouth daily.         . brimonidine (ALPHAGAN P) 0.1 % SOLN      Twice daily to both eyes         . calcium carbonate (TUMS - DOSED IN MG ELEMENTAL CALCIUM) 500 MG chewable tablet   Oral   Chew 1 tablet by mouth as needed for indigestion or heartburn.         . calcium-vitamin D (OSCAL 500/200 D-3) 500-200 MG-UNIT per tablet   Oral   Take 1 tablet by mouth daily.          Marland Kitchen doxazosin (CARDURA) 8 MG tablet      TAKE 1 TABLET BY MOUTH ONCE DAILY   30 tablet   2   . furosemide (LASIX) 40 MG tablet      TAKE 1/2 TABLET BY MOUTH ONCE DAILY         . Iron, Ferrous Gluconate, 256 (28 FE) MG TABS   Oral   Take 1 tablet by mouth daily.         Marland Kitchen latanoprost (XALATAN) 0.005 % ophthalmic solution   Both Eyes   Place 1 drop into both eyes 2 (two) times daily.          . Multiple Vitamin (MULTIVITAMIN) tablet   Oral   Take 1 tablet by mouth daily.         Marland Kitchen omeprazole (PRILOSEC) 20 MG capsule   Oral   Take 20 mg by mouth daily.           Allergies Clindamycin/lincomycin  Family History  Problem Relation Age of Onset  . Diabetes Mother   . Hypertension Mother   . Heart disease Mother   . Stroke Father   . Heart disease Father   . Diabetes Father   . Cancer Sister     uterine cancer  . Cancer Brother   . COPD Brother   . Hypertension Brother   . Cancer Brother   . Cancer Sister     gyn malignancy  . Cancer Sister     pancreatic cancer  . Allergies Sister   . Allergies Son   . Rheum arthritis Father   . Asthma Sister   . Asthma Son     Social History History  Substance Use Topics  . Smoking status: Former Smoker -- 0.50 packs/day for 30 years    Types: Pipe, Cigars    Quit date: 08/20/1978  . Smokeless tobacco: Former Systems developer  Types: Sarina Ser    Quit date: 11/16/1990  . Alcohol Use: No    Review of Systems Constitutional: No fever/chills Eyes: No visual changes. ENT: No sore throat. Cardiovascular: Denies chest  pain. Respiratory: Denies shortness of breath. Gastrointestinal: No abdominal pain.  No nausea, no vomiting.  No diarrhea.  No constipation. Genitourinary: Negative for dysuria. Musculoskeletal: Negative for back pain. Skin: Negative for rash. Neurological: See history of present illness  10-point ROS otherwise negative.  ____________________________________________   PHYSICAL EXAM:  VITAL SIGNS: ED Triage Vitals  Enc Vitals Group     BP 02/06/15 1956 183/114 mmHg     Pulse Rate 02/06/15 1956 57     Resp 02/06/15 1956 18     Temp 02/06/15 1956 97.9 F (36.6 C)     Temp Source 02/06/15 1956 Oral     SpO2 02/06/15 1956 100 %     Weight 02/06/15 1956 154 lb (69.854 kg)     Height 02/06/15 1956 5\' 4"  (1.626 m)     Head Cir --      Peak Flow --      Pain Score --      Pain Loc --      Pain Edu? --      Excl. in [place]? --     Constitutional: Alert and oriented to self. Well appearing and in no acute distress. His blood pressure checked in both arms and confirmed to be severely hypertensive. Significant elevated diastolic. Eyes: Conjunctivae are normal. PERRL. EOMI. Head: Atraumatic. Nose: No congestion/rhinnorhea. Mouth/Throat: Mucous membranes are moist.  Oropharynx non-erythematous. Neck: No stridor.   Cardiovascular: Normal rate, regular rhythm. Grossly normal heart sounds.  Good peripheral circulation. Respiratory: Normal respiratory effort.  No retractions. Lungs CTAB. Gastrointestinal: Soft and nontender. No distention. No abdominal bruits. No CVA tenderness. Musculoskeletal: No lower extremity tenderness nor edema.  No joint effusions. Neurologic:  Normal speech and language. No gross focal neurologic deficits are appreciated. Speech is normal. The patient is not well oriented to situation. He answers most but not all questions quite appropriately. No facial droop, normal cranial nerve exam. Normal motor sensory exam of all extremities. No pronator drift. Normal  finger-nose-finger bilaterally. Skin:  Skin is warm, dry and intact. No rash noted. Psychiatric: Mood and affect are normal. Speech and behavior are normal.  ____________________________________________   LABS (all labs ordered are listed, but only abnormal results are displayed)  Labs Reviewed  APTT - Abnormal; Notable for the following:    aPTT 37 (*)    All other components within normal limits  CBC - Abnormal; Notable for the following:    RBC 3.94 (*)    Hemoglobin 12.6 (*)    HCT 38.1 (*)    RDW 15.6 (*)    All other components within normal limits  COMPREHENSIVE METABOLIC PANEL - Abnormal; Notable for the following:    Glucose, Bld 107 (*)    BUN 23 (*)    ALT 13 (*)    GFR calc non Af Amer 56 (*)    All other components within normal limits  URINALYSIS COMPLETEWITH MICROSCOPIC ([facility] ONLY) - Abnormal; Notable for the following:    Color, Urine COLORLESS (*)    APPearance CLEAR (*)    Specific Gravity, Urine 1.003 (*)    All other components within normal limits  ETHANOL  PROTIME-INR  DIFFERENTIAL  URINE DRUG SCREEN, QUALITATIVE ([facility] ONLY)  TROPONIN I   ____________________________________________  EKG  ED ECG REPORT I, Annslee Tercero, the  attending physician, personally viewed and interpreted this ECG.  Date: 02/06/2015 EKG Time: 2053 Rate: 85 Rhythm: normal sinus rhythm QRS Axis: normal Intervals: normal, except for first-degree block ST/T Wave abnormalities: normal Conduction Disutrbances: none Narrative Interpretation: First-degree block, no acute ischemic changes. Given patient's ages is a very unremarkable, nonischemic appearing EKG.  ____________________________________________  RADIOLOGY  CT Head Wo Contrast (Final result) Result time: 02/06/15 20:58:02   Final result by Rad Results In Interface (02/06/15 20:58:02)   Narrative:   CLINICAL DATA: Elevated blood pressure with headache.  EXAM: CT HEAD WITHOUT  CONTRAST  TECHNIQUE: Contiguous axial images were obtained from the base of the skull through the vertex without intravenous contrast.  COMPARISON: 09/17/2014  FINDINGS: Skull and Sinuses:Negative for fracture or destructive process.  Patchy inflammatory mucosal thickening in the bilateral paranasal sinuses. No sinus effusion.  Orbits: Bilateral cataract resection. No acute findings.  Brain: No evidence of acute infarction, hemorrhage, hydrocephalus, or mass lesion/mass effect. There is generalized atrophy with mild for age small-vessel ischemic gliosis around the lateral ventricles.  IMPRESSION: 1. No acute findings. 2. Brain atrophy and mild small vessel disease. 3. Chronic sinusitis.     ____________________________________________   PROCEDURES  Procedure(s) performed: None  Critical Care performed: Yes, see critical care note(s)  CRITICAL CARE Performed by: Delman Kitten   Total critical care time: 40  Critical care time was exclusive of separately billable procedures and treating other patients.  Critical care was necessary to treat or prevent imminent or life-threatening deterioration.  Critical care was time spent personally by me on the following activities: development of treatment plan with patient and/or surrogate as well as nursing, discussions with consultants, evaluation of patient's response to treatment, examination of patient, obtaining history from patient or surrogate, ordering and performing treatments and interventions, ordering and review of laboratory studies, ordering and review of radiographic studies, pulse oximetry and re-evaluation of patient's condition.  Patient presents with acute altered mental status, exhibited by some confusion and previous speech disturbance. He has significant hypertension. I am concerned the patient may be having a hypertensive emergency, otherwise possibility of stroke, intracranial hemorrhage, TIA, are strongly  considered. The patient required emergent evaluation due to alteration of mental status and hemodynamic instability with elevated blood pressure requiring immediate treatment because of mental status changes. ____________________________________________   INITIAL IMPRESSION / ASSESSMENT AND PLAN / ED COURSE  Pertinent labs & imaging results that were available during my care of the patient were reviewed by me and considered in my medical decision making (see chart for details).  Patient present with acute confusion, mild headache, and significant hypertension. At this point, I suspect the patient may be having a hypertensive emergency. We will obtain CT scan. Additional considerations include stroke, TIA. He is taken 162 mg of aspirin prior to arrival. Patient does not have a history of hypertension.  I will give the patient hydralazine for blood pressure lowering immediately because of his symptoms and primary concern for hypertensive emergency. He has no focal neurologic deficits. In addition, patient is not a TPA candidate as he was last seen well at 2 PM, though I favor hypertensive emergency as etiology over stroke at present time. ____________________________________________  ----------------------------------------- 10:02 PM on 02/06/2015 -----------------------------------------   blood pressure improved. She is standing in the room, ambulating to use the urinal at this time.  Headache is improved CT scan is negative. We'll give some additional aspirin. We'll admit the patient for further workup for possible hypertensive emergency or TIA.  FINAL CLINICAL IMPRESSION(S) / ED DIAGNOSES  Final diagnoses:  Hypertensive emergency without congestive heart failure      Delman Kitten, MD 02/06/15 2209

## 2015-02-06 NOTE — ED Notes (Signed)
Pt woke up from his nap this evening around 1715 with some disorientation, the triage nurse was called and bp checked and found to be 199/80 and 189/78. Pt is alert and oriented at this time x4. Pt has been c/o a light headache all day, son states that he has a hx of tia's

## 2015-02-06 NOTE — ED Notes (Signed)
Pt to ED from [facility] c/o hypertension.  Pt states woke up from nap around 1700 today with confusion and headache that comes and goes.  Pt last known well around 1430 today before nap.  Worker at [employer] checked blood pressure and it was elevated.  Pt has hx of multiple TIAs.  Pt A&Ox4, speaking in complete and coherent sentences, strong equal bilateral hand grip, moves extremities x4, decreased facial symmetry on left side of face, pt left eye dilated to 65mm and right eye 8mm.

## 2015-02-07 DIAGNOSIS — I10 Essential (primary) hypertension: Secondary | ICD-10-CM | POA: Diagnosis not present

## 2015-02-07 LAB — TROPONIN I: Troponin I: <0.03 ng/mL (ref <0.031)

## 2015-02-07 NOTE — Progress Notes (Signed)
Pt alert and oriented x4, no complaints of pain or discomfort.  Bed in low position, call bell within reach.  Bed alarms on and functioning.  Assessment done and charted.  Will continue to monitor and do hourly rounding throughout the shift 

## 2015-02-07 NOTE — Discharge Summary (Signed)
Physician Discharge Summary  Alexander Goodwin QIW:[id number] DOB: 1921-06-24 DOA: 02/06/2015  PCP: Alexander Harrier, MD  Admit date: 02/06/2015 Discharge date: 02/07/2015  Time spent: 35  minutes  Recommendations for Outpatient Follow-up:  1 Follow up at [facility] with Dr. Ginette Goodwin  Call the office with any questions or concerns   Discharge Diagnoses:  Principal Problem: 1Hypertensive urgency   Discharge Condition: Fair   Diet recommendation: 2 gms sodium   Filed Weights   02/06/15 1956 02/06/15 2332  Weight: 69.854 kg (154 lb) 70.353 kg (155 lb 1.6 oz)    History of present illness:  Alexander Goodwin is a 81 year old male with a known history of hypertension hyperlipidemia presented to the ED complaint of headache. Patient was at his assisted living facility and when his blood pressure was taken was noted to have a systolic pressure greater than 200. Patient received  hydralazine for his elevated blood pressure and and there was subsequent improvement  Hospital Course:  Patient was admitted [facility] and his troponin levels were less than 0.03 CT of the head did not show any acute abnormalities there was mild small vessel atrophy. Patient's blood pressure returned to normal he was ambulated and he was discharged to his [place]  in stable condition His home medications were restarted has been advised to follow me Dr. Ginette Goodwin  in 1-2 weeks time    Discharge Exam: Filed Vitals:   02/07/15 1142  BP: 129/57  Pulse: 67  Temp: 98.2 F (36.8 C)  Resp: 18    General: NAD Cardiovascular: S1 S2 Respiratory: Clear to auscultation. Neuro: Non Focal  Discharge Instructions    Discharge Medication List as of 02/07/2015  2:50 PM    CONTINUE these medications which have NOT CHANGED   Details  acetaminophen (TYLENOL ARTHRITIS PAIN) 650 MG CR tablet Take 650 mg by mouth every 8 (eight) hours as needed.  , Until Discontinued, Historical Med     amLODipine (NORVASC) 10 MG tablet Take 10 mg by mouth daily.  , Until Discontinued, Historical Med    AZOPT 1 % ophthalmic suspension Place 1 drop into both eyes 2 (two) times daily., Until Discontinued, Historical Med    benzonatate (TESSALON) 200 MG capsule Take by mouth as needed., Until Discontinued, Historical Med    beta carotene w/minerals (OCUVITE) tablet Take 1 tablet by mouth daily., Until Discontinued, Historical Med    brimonidine (ALPHAGAN P) 0.1 % SOLN Place 1 drop into the right eye 3 (three) times daily. , Until Discontinued, Historical Med    calcium carbonate (TUMS - DOSED IN MG ELEMENTAL CALCIUM) 500 MG chewable tablet Chew 1 tablet by mouth as needed for indigestion or heartburn., Until Discontinued, Historical Med    calcium-vitamin D (OSCAL 500/200 D-3) 500-200 MG-UNIT per tablet Take 1 tablet by mouth daily. , Until Discontinued, Historical Med    dorzolamide-timolol (COSOPT) 22.3-6.8 MG/ML ophthalmic solution Place 1 drop into both eyes 2 (two) times daily., Until Discontinued, Historical Med    doxazosin (CARDURA) 8 MG tablet TAKE 1 TABLET BY MOUTH ONCE DAILY, Normal    furosemide (LASIX) 40 MG tablet TAKE 1/2 TABLET BY MOUTH ONCE DAILY, Historical Med    gabapentin (NEURONTIN) 300 MG capsule Take 300 mg by mouth 2 (two) times daily as needed (for shingles)., Until Discontinued, Historical Med    Iron, Ferrous Gluconate, 256 (28 FE) MG TABS Take 1 tablet by mouth daily., Until Discontinued, Historical Med    latanoprost (XALATAN) 0.005 % ophthalmic  solution Place 1 drop into both eyes 2 (two) times daily. , Until Discontinued, Historical Med    omeprazole (PRILOSEC) 20 MG capsule Take 20 mg by mouth daily., Until Discontinued, Historical Med    sucralfate (CARAFATE) 1 G tablet Take 1 tablet by mouth 2 (two) times daily before a meal., Starting 01/17/2015, Until Discontinued, Historical Med       Allergies  Allergen Reactions  . Clindamycin/Lincomycin      itching      The results of significant diagnostics from this hospitalization (including imaging, microbiology, ancillary and laboratory) are listed below for reference.    Significant Diagnostic Studies: Ct Head Wo Contrast  02/06/2015   CLINICAL DATA:  Elevated blood pressure with headache.  EXAM: CT HEAD WITHOUT CONTRAST  TECHNIQUE: Contiguous axial images were obtained from the base of the skull through the vertex without intravenous contrast.  COMPARISON:  09/17/2014  FINDINGS: Skull and Sinuses:Negative for fracture or destructive process.  Patchy inflammatory mucosal thickening in the bilateral paranasal sinuses. No sinus effusion.  Orbits: Bilateral cataract resection.  No acute findings.  Brain: No evidence of acute infarction, hemorrhage, hydrocephalus, or mass lesion/mass effect. There is generalized atrophy with mild for age small-vessel ischemic gliosis around the lateral ventricles.  IMPRESSION: 1. No acute findings. 2. Brain atrophy and mild small vessel disease. 3. Chronic sinusitis.   Electronically Signed   By: Alexander Goodwin M.D.   On: 02/06/2015 20:58    Microbiology: No results found for this or any previous visit (from the past 240 hour(s)).   Labs: Basic Metabolic Panel:  Recent Labs Lab 02/06/15 2034  NA 141  K 4.0  CL 107  CO2 29  GLUCOSE 107*  BUN 23*  CREATININE 1.11  CALCIUM 9.9   Liver Function Tests:  Recent Labs Lab 02/06/15 2034  AST 16  ALT 13*  ALKPHOS 100  BILITOT 0.5  PROT 7.1  ALBUMIN 3.7   No results for input(s): LIPASE, AMYLASE in the last 168 hours. No results for input(s): AMMONIA in the last 168 hours. CBC:  Recent Labs Lab 02/06/15 2034  WBC 6.0  NEUTROABS 3.7  HGB 12.6*  HCT 38.1*  MCV 96.7  PLT 188   Cardiac Enzymes:  Recent Labs Lab 02/06/15 2034 02/07/15 0009 02/07/15 0519 02/07/15 1116  TROPONINI <0.03 <0.03 <0.03 <0.03   BNP: BNP (last 3 results) No results for input(s): BNP in the last 8760  hours.  ProBNP (last 3 results) No results for input(s): PROBNP in the last 8760 hours.  CBG: No results for input(s): GLUCAP in the last 168 hours.     SignedTracie Goodwin   02/07/2015, 5:55 PM

## 2015-02-07 NOTE — Care Management (Addendum)
Patient presents from [place]- independent living.  It is documented in one place that patient is from assisted living but this is incorrect.  Spoke with attending and he denies need for any services post discharge

## 2015-02-07 NOTE — Progress Notes (Signed)
   02/07/15 0800  Clinical Encounter Type  Visited With Patient  Visit Type Initial;Spiritual support  Referral From Nurse  Consult/Referral To Chaplain  Spiritual Encounters  Spiritual Needs Prayer;Emotional  Stress Factors  Patient Stress Factors Exhausted;Health changes;Major life changes  Met w/patient. Prayer and spiritual support.  Chap. Paulene Tayag G. [place]

## 2015-02-07 NOTE — Clinical Social Work Note (Signed)
Pt is from [facility] [place] [facility].  No SNF placement needed at this time CSW signing off.

## 2015-02-07 NOTE — Care Management (Signed)
Medicare obs letter given and reviewed with patient.  Copy signed by patient and sent to medical records

## 2015-02-07 NOTE — Progress Notes (Signed)
PROGRESS NOTE  Alexander Goodwin WVP:[id number] DOB: Apr 27, 1921 DOA: 02/06/2015 PCP: Alexander Harrier, MD  Subjective  81 y/o male with hx of HTN, Hyperlipidemia, admitted with headache and confusion. Pt's BP was elevated with systolic of 546. This am pt feels well. BP is better controlled CT head; No acute findings   Objective: BP 129/57 mmHg  Pulse 67  Temp(Src) 98.2 F (36.8 C) (Oral)  Resp 18  Ht 5\' 4"  (1.626 m)  Wt 70.353 kg (155 lb 1.6 oz)  BMI 26.61 kg/m2  SpO2 98%  Intake/Output Summary (Last 24 hours) at 02/07/15 1245 Last data filed at 02/07/15 1100  Gross per 24 hour  Intake    360 ml  Output    450 ml  Net    -90 ml   Filed Weights   02/06/15 1956 02/06/15 2332  Weight: 69.854 kg (154 lb) 70.353 kg (155 lb 1.6 oz)    Exam:   General:  Not in distress  Cardiovascular: s1 S2  Respiratory: Clear to auscultation  Abdomen: Soft  Neuro:Non Focal  Data Reviewed: Basic Metabolic Panel:  Recent Labs Lab 02/06/15 2034  NA 141  K 4.0  CL 107  CO2 29  GLUCOSE 107*  BUN 23*  CREATININE 1.11  CALCIUM 9.9   Liver Function Tests:  Recent Labs Lab 02/06/15 2034  AST 16  ALT 13*  ALKPHOS 100  BILITOT 0.5  PROT 7.1  ALBUMIN 3.7   No results for input(s): LIPASE, AMYLASE in the last 168 hours. No results for input(s): AMMONIA in the last 168 hours. CBC:  Recent Labs Lab 02/06/15 2034  WBC 6.0  NEUTROABS 3.7  HGB 12.6*  HCT 38.1*  MCV 96.7  PLT 188   Cardiac Enzymes:    Recent Labs Lab 02/06/15 2034 02/07/15 0009 02/07/15 0519 02/07/15 1116  TROPONINI <0.03 <0.03 <0.03 <0.03   BNP (last 3 results) No results for input(s): BNP in the last 8760 hours.  ProBNP (last 3 results) No results for input(s): PROBNP in the last 8760 hours.  CBG: No results for input(s): GLUCAP in the last 168 hours.  No results found for this or any previous visit (from the past 240 hour(s)).   Studies: Ct Head Wo Contrast  02/06/2015    CLINICAL DATA:  Elevated blood pressure with headache.  EXAM: CT HEAD WITHOUT CONTRAST  TECHNIQUE: Contiguous axial images were obtained from the base of the skull through the vertex without intravenous contrast.  COMPARISON:  09/17/2014  FINDINGS: Skull and Sinuses:Negative for fracture or destructive process.  Patchy inflammatory mucosal thickening in the bilateral paranasal sinuses. No sinus effusion.  Orbits: Bilateral cataract resection.  No acute findings.  Brain: No evidence of acute infarction, hemorrhage, hydrocephalus, or mass lesion/mass effect. There is generalized atrophy with mild for age small-vessel ischemic gliosis around the lateral ventricles.  IMPRESSION: 1. No acute findings. 2. Brain atrophy and mild small vessel disease. 3. Chronic sinusitis.   Electronically Signed   By: Monte Fantasia M.D.   On: 02/06/2015 20:58    Scheduled Meds: . heparin  5,000 Units Subcutaneous 3 times per day  . sodium chloride  3 mL Intravenous Q12H    Continuous Infusions:   Assessment/Plan: Principal Problem: 1  Hypertensive urgency; Doing better Resume Home meds 2 BPH: On Cardura  3 GERD: On PPI Ambulate today. Stable for D/c later today   Code Status: DNR       Alexander Goodwin   02/07/2015, 12:45 PM

## 2015-02-07 NOTE — Progress Notes (Signed)
Discharge: Pt d/c from room via wheelchair, with [facility] [employer] [facility] rep.  Discharge instructions given to the patient  No questions from pt, reintegrated to the pt to call or go to the ED for chest discomfort. Pt dressed in street clothes and left with discharge papers in hand. IV d/ced, tele removed and no complaints of pain or discomfort.

## 2015-02-17 DIAGNOSIS — C44619 Basal cell carcinoma of skin of left upper limb, including shoulder: Secondary | ICD-10-CM | POA: Diagnosis not present

## 2015-02-17 DIAGNOSIS — L821 Other seborrheic keratosis: Secondary | ICD-10-CM | POA: Diagnosis not present

## 2015-02-17 DIAGNOSIS — X32XXXA Exposure to sunlight, initial encounter: Secondary | ICD-10-CM | POA: Diagnosis not present

## 2015-02-17 DIAGNOSIS — L570 Actinic keratosis: Secondary | ICD-10-CM | POA: Diagnosis not present

## 2015-02-17 DIAGNOSIS — C44612 Basal cell carcinoma of skin of right upper limb, including shoulder: Secondary | ICD-10-CM | POA: Diagnosis not present

## 2015-02-17 DIAGNOSIS — Z85828 Personal history of other malignant neoplasm of skin: Secondary | ICD-10-CM | POA: Diagnosis not present

## 2015-03-07 DIAGNOSIS — H34831 Tributary (branch) retinal vein occlusion, right eye: Secondary | ICD-10-CM | POA: Diagnosis not present

## 2015-03-07 DIAGNOSIS — H4011X3 Primary open-angle glaucoma, severe stage: Secondary | ICD-10-CM | POA: Diagnosis not present

## 2015-03-14 ENCOUNTER — Encounter: Payer: Self-pay | Admitting: Critical Care Medicine

## 2015-03-14 ENCOUNTER — Ambulatory Visit (INDEPENDENT_AMBULATORY_CARE_PROVIDER_SITE_OTHER): Payer: Medicare Other | Admitting: Critical Care Medicine

## 2015-03-14 VITALS — BP 136/68 | HR 69 | Temp 97.5°F | Ht 63.0 in | Wt 162.6 lb

## 2015-03-14 DIAGNOSIS — J852 Abscess of lung without pneumonia: Secondary | ICD-10-CM

## 2015-03-14 DIAGNOSIS — Z66 Do not resuscitate: Secondary | ICD-10-CM

## 2015-03-14 NOTE — Patient Instructions (Addendum)
No change in medications. Return in         6 months or as needed , at [place] office

## 2015-03-14 NOTE — Progress Notes (Signed)
   Subjective:    Patient ID: Alexander Goodwin, male    DOB: November 23, 1920, 81 y.o.   MRN: [id number]  HPI 03/14/2015 Chief Complaint  Patient presents with  . 6 month follow up    Cough unchanged with clear mucus.  Coughing with meals and with liquids.  Hoarseness.  No SOB, wheezing, or chest tightness/CP.   Pt still with a cough. Notes some clear mucus.  Coughs with meals and liquids.  Notes some hoarseness . Pt denies any significant sore throat, nasal congestion or excess secretions, fever, chills, sweats, unintended weight loss, pleurtic or exertional chest pain, orthopnea PND, or leg swelling Pt denies any increase in rescue therapy over baseline, denies waking up needing it or having any early am or nocturnal exacerbations of coughing/wheezing/or dyspnea. Pt also denies any obvious fluctuation in symptoms with  weather or environmental change or other alleviating or aggravating factors   Current Medications, Allergies, Complete Past Medical History, Past Surgical History, Family History, and Social History were reviewed in [place] record per todays encounter:  03/14/2015  Review of Systems  Constitutional: Negative.   HENT: Positive for trouble swallowing. Negative for ear pain, postnasal drip, rhinorrhea, sinus pressure, sore throat and voice change.   Eyes: Negative.   Respiratory: Positive for cough. Negative for apnea, choking, chest tightness, shortness of breath, wheezing and stridor.   Cardiovascular: Negative.  Negative for chest pain, palpitations and leg swelling.  Gastrointestinal: Negative.  Negative for nausea, vomiting, abdominal pain and abdominal distention.  Genitourinary: Negative.   Musculoskeletal: Negative.  Negative for myalgias and arthralgias.  Skin: Negative.  Negative for rash.  Allergic/Immunologic: Negative.  Negative for environmental allergies and food allergies.  Neurological: Negative.  Negative for dizziness, syncope, weakness  and headaches.  Hematological: Negative.  Negative for adenopathy. Does not bruise/bleed easily.  Psychiatric/Behavioral: Negative.  Negative for sleep disturbance and agitation. The patient is not nervous/anxious.        Objective:   Physical Exam Filed Vitals:   03/14/15 1104  BP: 136/68  Pulse: 69  Temp: 97.5 F (36.4 C)  TempSrc: Oral  Height: 5\' 3"  (1.6 m)  Weight: 162 lb 9.6 oz (73.755 kg)  SpO2: 98%    Gen: Pleasant, well-nourished, in no distress,  normal affect  ENT: No lesions,  mouth clear,  oropharynx clear, no postnasal drip  Neck: No JVD, no TMG, no carotid bruits  Lungs: No use of accessory muscles, no dullness to percussion, clear without rales or rhonchi  Cardiovascular: RRR, heart sounds normal, no murmur or gallops, no peripheral edema  Abdomen: soft and NT, no HSM,  BS normal  Musculoskeletal: No deformities, no cyanosis or clubbing  Neuro: alert, non focal  Skin: Warm, no lesions or rashes  No results found.     Assessment & Plan:  I personally reviewed all images and lab data in the [facility] system as well as any outside material available during this office visit and agree with the  radiology impressions.   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

## 2015-03-14 NOTE — Assessment & Plan Note (Signed)
Chronic cavitation in rul now improved, Hx of chronic aspiration Plan Observation for now No additional antibiotics needed Pt to f/u at [place] office in 6 months

## 2015-03-14 NOTE — Assessment & Plan Note (Signed)
Pt remains DNR status

## 2015-03-15 ENCOUNTER — Ambulatory Visit (INDEPENDENT_AMBULATORY_CARE_PROVIDER_SITE_OTHER): Payer: Medicare Other | Admitting: Family Medicine

## 2015-03-15 ENCOUNTER — Encounter: Payer: Self-pay | Admitting: Family Medicine

## 2015-03-15 VITALS — BP 120/62 | HR 61 | Temp 98.3°F | Ht 65.0 in | Wt 162.5 lb

## 2015-03-15 DIAGNOSIS — Z8719 Personal history of other diseases of the digestive system: Secondary | ICD-10-CM | POA: Diagnosis not present

## 2015-03-15 DIAGNOSIS — M1990 Unspecified osteoarthritis, unspecified site: Secondary | ICD-10-CM

## 2015-03-15 DIAGNOSIS — K2100 Gastro-esophageal reflux disease with esophagitis, without bleeding: Secondary | ICD-10-CM

## 2015-03-15 DIAGNOSIS — I10 Essential (primary) hypertension: Secondary | ICD-10-CM

## 2015-03-15 DIAGNOSIS — T17908D Unspecified foreign body in respiratory tract, part unspecified causing other injury, subsequent encounter: Secondary | ICD-10-CM

## 2015-03-15 DIAGNOSIS — N400 Enlarged prostate without lower urinary tract symptoms: Secondary | ICD-10-CM

## 2015-03-15 DIAGNOSIS — E785 Hyperlipidemia, unspecified: Secondary | ICD-10-CM

## 2015-03-15 DIAGNOSIS — T17908A Unspecified foreign body in respiratory tract, part unspecified causing other injury, initial encounter: Secondary | ICD-10-CM | POA: Insufficient documentation

## 2015-03-15 NOTE — Progress Notes (Signed)
Subjective:    Patient ID: Alexander Goodwin, male    DOB: 12/29/1920, 81 y.o.   MRN: [id number]  HPI 81 year old male presents to clinic today to establish care. Current issues are below:  GERD; History of esophagitis  Stable and doing well on Carafate and Omeprazole.  Per EMR, last EGD was in 2013 and was unremarkable other than for Hiatal hernia.  Aspiration/Chronic cough  Patient is followed by pulmonology. Patient has a history of lung abscess secondary to chronic aspiration.  Patient still has intermittent issues with coughing particularly with meals.  No recent fevers or chills. No reports of shortness of breath.  Things are currently stable at this time.  HTN  Well controlled.  Patient currently doing well on Norvasc, Lasix and Cardura.  BPH  No current issues with voiding.  Stable on Cardura.   HLD Lipid Panel     Component Value Date/Time   CHOL 143 10/07/2013 0455   TRIG 79 10/07/2013 0455   HDL 44 10/07/2013 0455   VLDL 16 10/07/2013 0455   LDLCALC 83 10/07/2013 0455     Last lipid panel above.  Not currently on treatment for this.     PMH, Surgical Hx, Family Hx, Social History reviewed and updated as below.  Past Medical History  Diagnosis Date  . Diverticulitis of colon   . History of colon polyps   . Arthritis     right knee, hips, neck. He denies any major limitation in activities  . GERD (gastroesophageal reflux disease)     well controlled with medication. No nocturnal symptoms on a regular basis  . Glaucoma     both eyes, followed closely by Dr. Janyth Contes  . Esophagitis     Last EGD Fall '11   . Abscess of lung without pneumonia     Right upper lobe chronic lung abscess and prior left upper lobe lung abscess due to chronic aspiration  . Pancreatitis     resolved after GB surgery  . Stroke     TIAs - age 89  . BPH (benign prostatic hypertrophy) with urinary obstruction   . Hyperlipemia   . Hypertension   . Seasonal allergies    . Dysphagia, oropharyngeal    Past Surgical History  Procedure Laterality Date  . Cholecystectomy      '88 - laparotomy ([place])  . Appendectomy      '88 along with GB  . Bronchoscopy  1993  . Video bronchoscopy  03/26/2012    Procedure: VIDEO BRONCHOSCOPY WITH FLUORO;  Surgeon: Elsie Stain, MD;  Location: [facility];  Service: Cardiopulmonary;  Laterality: Bilateral;   Family History  Problem Relation Age of Onset  . Diabetes Mother   . Hypertension Mother   . Heart disease Mother   . Stroke Father   . Heart disease Father   . Diabetes Father   . Rheum arthritis Father   . Arthritis Father   . Cancer Sister     uterine cancer  . COPD Brother   . Hypertension Brother   . Cancer Brother     colon. brain, and hip  . Diabetes Brother   . Cancer Brother   . Diabetes Brother   . Cancer Sister     gyn malignancy  . Cancer Sister     pancreatic cancer  . Allergies Sister   . Allergies Son   . Asthma Sister   . Asthma Son   . Diabetes Maternal Grandmother   . Diabetes Maternal  Grandfather   . Diabetes Paternal Grandmother   . Diabetes Paternal Grandfather    History   Social History  . Marital Status: Married    Spouse Name: N/A  . Number of Children: 3  . Years of Education: HSG   Occupational History  . 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 Smoking status: Former Smoker -- 0.50 packs/day for 30 years    Types: Pipe, Cigars    Quit date: 08/20/1978  . Smokeless tobacco: Former Systems developer    Types: Chew    Quit date: 11/16/1990  . Alcohol Use: No  . Drug Use: No  . Sexual Activity: Yes   Other Topics Concern  . None   Social History Narrative   HSG.   Married 1948. 3 sons - '52, '57,'58- 5 grandchildren, one deceased at 72 - OD.  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 Marriage in good health. End of Life Care -DNR/DNI,  No prolonged heroic or futile measures.     Review of Systems Per HPI.  All other systems negative.     Objective: Blood pressure 120/62, pulse 61, temperature 98.3 F (36.8 C), temperature source Oral, height 5\' 5"  (1.651 m), weight 162 lb 8 oz (73.71 kg), SpO2 97 %. Body mass index is 27.04 kg/(m^2).    Physical Exam  Constitutional:  Well appearing, well groomed elderly male in NAD.   HENT:  Head: Normocephalic and atraumatic.  Eyes: Conjunctivae are normal. No scleral icterus.  Neck: Neck supple.  Cardiovascular: Normal rate and regular rhythm.   No murmur heard. Pulmonary/Chest: Effort normal and breath sounds normal. He has no wheezes. He has no rales.  Abdominal: Soft. He exhibits no distension. There is no tenderness.  Musculoskeletal:  Trace to 1+ LE edema.   Lymphadenopathy:    He has no cervical adenopathy.  Neurological: He is alert. He exhibits normal muscle tone. Coordination normal.  MMSE - 28.   Skin: Skin is warm and dry.  Psychiatric: He has a normal mood and affect. His behavior is normal.  Vitals reviewed.      Assessment & Plan:  See Problem List

## 2015-03-15 NOTE — Assessment & Plan Note (Signed)
Stable. Patient to continue omeprazole and Carafate.

## 2015-03-15 NOTE — Assessment & Plan Note (Signed)
Was previously well controlled in 2015. Given advanced age and not proceed with statin therapy.

## 2015-03-15 NOTE — Assessment & Plan Note (Signed)
Patient appears to have a history of chronic aspiration. He is followed closely by pulmonology. Things appear to be stable this time. Had a long discussion with patient and his wife about potential workup and likely dietary restrictions. Patient does not want further workup or intervention at this time. Will continue to monitor.

## 2015-03-15 NOTE — Progress Notes (Signed)
Pre visit review using our clinic review tool, if applicable. No additional management support is needed unless otherwise documented below in the visit note. 

## 2015-03-15 NOTE — Assessment & Plan Note (Signed)
Doing well at this time. I discussed the issues with orthostatic hypotension from Cardura. Patient has been on this for years and is stable so will not switch to Flomax at this time.

## 2015-03-15 NOTE — Assessment & Plan Note (Signed)
Well controlled.  No indication for Lasix; advised patient and wife that he can stop this. Will continue Norvasc.

## 2015-03-15 NOTE — Patient Instructions (Signed)
It was nice to see you today.  Stop the lasix.  Continue his other medications.  Follow up in 3 months.

## 2015-04-27 DIAGNOSIS — C44629 Squamous cell carcinoma of skin of left upper limb, including shoulder: Secondary | ICD-10-CM | POA: Diagnosis not present

## 2015-04-27 DIAGNOSIS — D485 Neoplasm of uncertain behavior of skin: Secondary | ICD-10-CM | POA: Diagnosis not present

## 2015-04-27 DIAGNOSIS — X32XXXA Exposure to sunlight, initial encounter: Secondary | ICD-10-CM | POA: Diagnosis not present

## 2015-04-27 DIAGNOSIS — Z85828 Personal history of other malignant neoplasm of skin: Secondary | ICD-10-CM | POA: Diagnosis not present

## 2015-04-27 DIAGNOSIS — D044 Carcinoma in situ of skin of scalp and neck: Secondary | ICD-10-CM | POA: Diagnosis not present

## 2015-04-27 DIAGNOSIS — L570 Actinic keratosis: Secondary | ICD-10-CM | POA: Diagnosis not present

## 2015-05-09 DIAGNOSIS — H4011X3 Primary open-angle glaucoma, severe stage: Secondary | ICD-10-CM | POA: Diagnosis not present

## 2015-05-25 DIAGNOSIS — L905 Scar conditions and fibrosis of skin: Secondary | ICD-10-CM | POA: Diagnosis not present

## 2015-05-25 DIAGNOSIS — C44629 Squamous cell carcinoma of skin of left upper limb, including shoulder: Secondary | ICD-10-CM | POA: Diagnosis not present

## 2015-05-28 DIAGNOSIS — Z23 Encounter for immunization: Secondary | ICD-10-CM | POA: Diagnosis not present

## 2015-06-08 DIAGNOSIS — L905 Scar conditions and fibrosis of skin: Secondary | ICD-10-CM | POA: Diagnosis not present

## 2015-06-08 DIAGNOSIS — D0462 Carcinoma in situ of skin of left upper limb, including shoulder: Secondary | ICD-10-CM | POA: Diagnosis not present

## 2015-06-08 DIAGNOSIS — C44629 Squamous cell carcinoma of skin of left upper limb, including shoulder: Secondary | ICD-10-CM | POA: Diagnosis not present

## 2015-06-15 ENCOUNTER — Encounter: Payer: Self-pay | Admitting: Family Medicine

## 2015-06-15 ENCOUNTER — Ambulatory Visit (INDEPENDENT_AMBULATORY_CARE_PROVIDER_SITE_OTHER): Payer: Medicare Other | Admitting: Family Medicine

## 2015-06-15 VITALS — BP 106/52 | HR 60 | Temp 97.5°F | Ht 65.0 in | Wt 165.0 lb

## 2015-06-15 DIAGNOSIS — F0150 Vascular dementia without behavioral disturbance: Secondary | ICD-10-CM | POA: Insufficient documentation

## 2015-06-15 MED ORDER — DONEPEZIL HCL 5 MG PO TABS: 5.0000 mg | ORAL_TABLET | Freq: Every day | ORAL | Status: DC

## 2015-06-15 NOTE — Progress Notes (Signed)
Pre visit review using our clinic review tool, if applicable. No additional management support is needed unless otherwise documented below in the visit note. 

## 2015-06-15 NOTE — Progress Notes (Signed)
Subjective:  Patient ID: Alexander Goodwin, male    DOB: December 14, 1920  Age: 81 y.o. MRN: [id number]  CC: Memory issues  HPI:  81 year old male with a PMH of HTN, HLD, TIA presents to the clinic today for evaluation of the above.  He is accompanied by his son today.  Memory issues  At his previous visit his wife brought up the fact that his memory is poor intermittently.  At that visit his MMSE was 28. I deferred medication at that time given MMSE.  He presents today for follow-up regarding this.  His son reports that he has significant difficulty with short-term memory. He has difficulty remembering things that he's told, directions to places, etc.  Long-term memory is intact per his son.  He states that this is been slowly worsening and has been worse as of recent.  No exacerbating or relieving factors. No other symptoms today.  Social Hx   Social History   Social History  . Marital Status: Married    Spouse Name: N/A  . Number of Children: 3  . Years of Education: HSG   Occupational History  . 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 Smoking status: Former Smoker -- 0.50 packs/day for 30 years    Types: Pipe, Cigars    Quit date: 08/20/1978  . Smokeless tobacco: Former Systems developer    Types: Chew    Quit date: 11/16/1990  . Alcohol Use: No  . Drug Use: No  . Sexual Activity: Yes   Other Topics Concern  . None   Social History Narrative   HSG.   Married 1948. 3 sons - '52, '57,'58- 5 grandchildren, one deceased at 22 - OD.  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 Marriage in good health. End of Life Care -DNR/DNI,  No prolonged heroic or futile measures.    Review of Systems  Constitutional: Negative.   Respiratory: Negative for shortness of breath.   Cardiovascular: Negative for chest pain.   Objective:  BP 106/52 mmHg  Pulse 60  Temp(Src) 97.5 F (36.4 C) (Oral)  Ht 5\' 5"  (1.651 m)   Wt 165 lb (74.844 kg)  BMI 27.46 kg/m2  SpO2 96%  BP/Weight 06/15/2015 03/15/2015 9/50/9326  Systolic BP 712 458 099  Diastolic BP 52 62 68  Wt. (Lbs) 165 162.5 162.6  BMI 27.46 27.04 28.81   Physical Exam  Constitutional:  Elderly male, NAD.   Cardiovascular: Normal rate and regular rhythm.   Pulmonary/Chest: Effort normal. No respiratory distress. He has no wheezes. He has no rales.  Neurological: He is alert.  No focal deficits.   Psychiatric:  Conversant and interactive. Normal mood and affect.  MMSE - 22.  Vitals reviewed.  Lab Results  Component Value Date   WBC 6.0 02/06/2015   HGB 12.6* 02/06/2015   HCT 38.1* 02/06/2015   PLT 188 02/06/2015   GLUCOSE 107* 02/06/2015   CHOL 143 10/07/2013   TRIG 79 10/07/2013   HDL 44 10/07/2013   LDLCALC 83 10/07/2013   ALT 13* 02/06/2015   AST 16 02/06/2015   NA 141 02/06/2015   K 4.0 02/06/2015   CL 107 02/06/2015   CREATININE 1.11 02/06/2015   BUN 23* 02/06/2015   CO2 29 02/06/2015   INR 1.06 02/06/2015   Assessment & Plan:   Problem List Items Addressed This Visit    Dementia, vascular - Primary  Patient's chart reviewed.  Patient has a document history of TIAs. Prior MRI revealed (2003) scattered chronic white matter infarcts. Patient likely has vascular dementia. MMSE 22 today. After discussion of risk and benefit with the patient and his son they elected to proceed with Aricept. Rx sent today.      Relevant Medications   donepezil (ARICEPT) 5 MG tablet      Meds ordered this encounter  Medications  . donepezil (ARICEPT) 5 MG tablet    Sig: Take 1 tablet (5 mg total) by mouth at bedtime. May increase to 10 mg daily after 4-6 weeks.    Dispense:  90 tablet    Refill:  0    Follow-up:   Thersa Salt, DO

## 2015-06-15 NOTE — Patient Instructions (Signed)
It was nice to see you today.  Start the aricept.  Follow up in 3 months.  Take care  Dr. Lacinda Axon

## 2015-06-15 NOTE — Assessment & Plan Note (Signed)
Patient's chart reviewed.  Patient has a document history of TIAs. Prior MRI revealed (2003) scattered chronic white matter infarcts. Patient likely has vascular dementia. MMSE 22 today. After discussion of risk and benefit with the patient and his son they elected to proceed with Aricept. Rx sent today.

## 2015-06-22 DIAGNOSIS — D044 Carcinoma in situ of skin of scalp and neck: Secondary | ICD-10-CM | POA: Diagnosis not present

## 2015-07-27 DIAGNOSIS — B351 Tinea unguium: Secondary | ICD-10-CM | POA: Diagnosis not present

## 2015-07-27 DIAGNOSIS — M79675 Pain in left toe(s): Secondary | ICD-10-CM | POA: Diagnosis not present

## 2015-07-27 DIAGNOSIS — M79674 Pain in right toe(s): Secondary | ICD-10-CM | POA: Diagnosis not present

## 2015-08-04 ENCOUNTER — Ambulatory Visit (INDEPENDENT_AMBULATORY_CARE_PROVIDER_SITE_OTHER): Payer: Medicare Other | Admitting: Internal Medicine

## 2015-08-04 ENCOUNTER — Encounter: Payer: Self-pay | Admitting: Internal Medicine

## 2015-08-04 VITALS — BP 120/60 | HR 64 | Wt 162.0 lb

## 2015-08-04 DIAGNOSIS — J984 Other disorders of lung: Secondary | ICD-10-CM | POA: Diagnosis not present

## 2015-08-04 DIAGNOSIS — J852 Abscess of lung without pneumonia: Secondary | ICD-10-CM | POA: Diagnosis not present

## 2015-08-04 DIAGNOSIS — J189 Pneumonia, unspecified organism: Secondary | ICD-10-CM

## 2015-08-04 NOTE — Progress Notes (Signed)
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  --As this appears stable to further interventions are needed at this time.   DNR (do not resuscitate) Pt reaffirmed DNR status, today in the presence of his wife. He maintains that he would not want to be put on any form of artificial life support, he would not want to be resuscitated, and would not want to be on the ventilator.    Date: 08/04/2015  MRN# [id number] GABRIAL SCHELLIN Jan 10, 1921   Alexander Goodwin is a 81 y.o. old male seen in follow up for chief complaint of  No chief complaint on file.   HPI:  He feels that he has been doing OK, he notes that he has occasional cough. He feels that his reflux is controlled. His wife is present and gives much of the history. She notes that when he eats he usually begins coughing after a meal. He does not feel that his breathing is difficult.    Review of testing: Chest x-ray from her 09/30/2014: Hyperinflation, scarring in the right lung. CT chest 03/11/2012: Bilateral lung masses, with cavitary area in the right side.   Medication:   Outpatient Encounter Prescriptions as of 08/04/2015  Medication Sig  . acetaminophen (TYLENOL ARTHRITIS PAIN) 650 MG CR tablet Take 650 mg by mouth every 8 (eight) hours as needed.    Marland Kitchen amLODipine (NORVASC) 10 MG tablet Take 10 mg by mouth daily.    . AZOPT 1 % ophthalmic suspension Place 1 drop into both eyes 2 (two) times daily.  . benzonatate (TESSALON) 200 MG capsule Take by mouth as needed.  . beta carotene w/minerals (OCUVITE) tablet Take 1 tablet by mouth daily.  . brimonidine (ALPHAGAN P) 0.1 % SOLN Place 1 drop into both eyes 3 (three) times daily.   . calcium carbonate (TUMS - DOSED IN MG ELEMENTAL  CALCIUM) 500 MG chewable tablet Chew 1 tablet by mouth as needed for indigestion or heartburn.  . calcium-vitamin D (OSCAL 500/200 D-3) 500-200 MG-UNIT per tablet Take 1 tablet by mouth daily.   Marland Kitchen donepezil (ARICEPT) 5 MG tablet Take 1 tablet (5 mg total) by mouth at bedtime. May increase to 10 mg daily after 4-6 weeks.  . dorzolamide-timolol (COSOPT) 22.3-6.8 MG/ML ophthalmic solution Place 1 drop into both eyes 2 (two) times daily.  Marland Kitchen doxazosin (CARDURA) 8 MG tablet TAKE 1 TABLET BY MOUTH ONCE DAILY  . gabapentin (NEURONTIN) 300 MG capsule Take 300 mg by mouth 2 (two) times daily as needed (for shingles).  . Iron, Ferrous Gluconate, 256 (28 FE) MG TABS Take 1 tablet by mouth daily.  Marland Kitchen latanoprost (XALATAN) 0.005 % ophthalmic solution Place 1 drop into both eyes 2 (two) times daily.   Marland Kitchen omeprazole (PRILOSEC) 20 MG capsule Take 20 mg by mouth daily.  . sucralfate (CARAFATE) 1 G tablet Take 1 tablet by mouth 2 (two) times daily before a meal.   No facility-administered encounter medications on file as of 08/04/2015.     Allergies:  Clindamycin/lincomycin  Review of Systems: Gen:  Denies  fever, sweats. HEENT: Denies blurred vision. Cvc:  No dizziness, chest pain or heaviness Resp:   Denies cough or sputum porduction. Gi: Denies swallowing difficulty, stomach pain. constipation, bowel incontinence Gu:  Denies  bladder incontinence, burning urine Ext:   No Joint pain, stiffness. Skin: No skin rash, easy bruising. Endoc:  No polyuria, polydipsia. Psych: No depression, insomnia. Other:  All other systems were reviewed and found to be negative other than what is mentioned in the HPI.   Physical Examination:   VS: There were no vitals taken for this visit.  General Appearance: No distress  Neuro:without focal findings,  speech normal,  HEENT: PERRLA, EOM intact. Pulmonary: normal breath sounds, No wheezing.   CardiovascularNormal S1,S2.  No m/r/g.   Abdomen: Benign, Soft,  non-tender. Renal:  No costovertebral tenderness  GU:  Not performed at this time. Endoc: No evident thyromegaly, no signs of acromegaly. Skin:   warm, no rash. Extremities: normal, no cyanosis, clubbing.   LABORATORY PANEL:   CBC No results for input(s): WBC, HGB, HCT, PLT in the last 168 hours. ------------------------------------------------------------------------------------------------------------------  Chemistries  No results for input(s): NA, K, CL, CO2, GLUCOSE, BUN, CREATININE, CALCIUM, MG, AST, ALT, ALKPHOS, BILITOT in the last 168 hours.  Invalid input(s): GFRCGP ------------------------------------------------------------------------------------------------------------------  Cardiac Enzymes No results for input(s): TROPONINI in the last 168 hours. ------------------------------------------------------------  RADIOLOGY:   No results found for this or any previous visit. Results for orders placed during the hospital encounter of 09/30/14  DG Chest 2 View   Narrative CLINICAL DATA:  Subsequent evaluation of pneumonia  EXAM: CHEST  2 VIEW  COMPARISON:  09/15/2014  FINDINGS: Mild cardiac enlargement stable. Calcified right mediastinal lymph node stable. Uncoiling of the aorta stable.  Stable band of opacity right mid lung zone. Stable from 09/15/2014 and decreased from 10/09/2012. In the left upper lobe there are a few mild linear opacities decreased when compared to 09/15/2014.  IMPRESSION: Minimal residual radiographic left upper lobe infiltrate significantly improved from 09/15/2014. No evidence of mass or nodularity. Consider followup to document complete resolution.  Stable scarring right middle lobe.   Electronically Signed   By: Skipper Cliche M.D.   On: 09/30/2014 17:39    ------------------------------------------------------------------------------------------------------------------  Thank  you for allowing [facility]  [facility] to assist in the care of your patient. Our recommendations are noted above.  Please contact us if we can be of further service.   Marda Stalker, MD.  [facility] Office Number: [phone]  Patricia Pesa, M.D.  Vilinda Boehringer, M.D.  Merton Border, M.D

## 2015-08-04 NOTE — Patient Instructions (Signed)
--  CXR 2 view in 6 months and follow up after that.  --Call earlier if you start coughing up blood or if your coughing or breathing becomes worse.

## 2015-08-04 NOTE — Addendum Note (Signed)
Addended by: Oscar La R on: 08/04/2015 10:47 AM   Modules accepted: Orders, Medications

## 2015-08-19 ENCOUNTER — Other Ambulatory Visit: Payer: Self-pay | Admitting: Family Medicine

## 2015-09-14 ENCOUNTER — Encounter: Payer: Self-pay | Admitting: Family Medicine

## 2015-09-14 ENCOUNTER — Ambulatory Visit (INDEPENDENT_AMBULATORY_CARE_PROVIDER_SITE_OTHER): Payer: Medicare Other | Admitting: Family Medicine

## 2015-09-14 VITALS — BP 136/84 | HR 58 | Temp 97.5°F | Ht 65.0 in | Wt 168.0 lb

## 2015-09-14 DIAGNOSIS — R198 Other specified symptoms and signs involving the digestive system and abdomen: Secondary | ICD-10-CM | POA: Diagnosis not present

## 2015-09-14 DIAGNOSIS — F0150 Vascular dementia without behavioral disturbance: Secondary | ICD-10-CM | POA: Diagnosis not present

## 2015-09-14 DIAGNOSIS — I10 Essential (primary) hypertension: Secondary | ICD-10-CM | POA: Diagnosis not present

## 2015-09-14 NOTE — Assessment & Plan Note (Signed)
Chronic. Stable. Wife states that she seemed little improvement with Aricept. I informed her that she may not this may just slow progression. I discussed discontinuation of therapy given patient's current GI issues and she prefers to continue him on it for the time being. I informed her that his GI symptoms are likely the result of this medication but she is not in agreement. Will continue for now

## 2015-09-14 NOTE — Assessment & Plan Note (Signed)
New problem. Likely related to recent start of Aricept. I encouraged the wife to consider discontinuation. See separate problem. Patient had a successful and soft bowel movement during our office visit. Advised PRN Miralax.

## 2015-09-14 NOTE — Progress Notes (Signed)
Subjective:  Patient ID: Alexander Goodwin, male    DOB: 12/19/1920  Age: 82 y.o. MRN: [id number]  CC: Follow up, constipation  HPI:  82 year old male with a PMH of HTN, HLD, Dementia presents for follow up.  Dementia  Chronic.  At our last visit, patient was started on Aricept after discussion of risk and benefits.  His wife has noted much improvement with the medication.  He is now having GI issues (see below). This is likely from the Aricept.  HTN  Stable on Norvasc.  No reported chest pain, SOB.  Constipation/diarrhea  Wife states that he's been complaining of constipation as well as loose stool.  He has had some associated abdominal discomfort.  No fever, chills.  He has taken a laxative recently with improvement in constipation.  No reports of blood in the stool.  No current complaints of abdominal pain.  No exacerbating factors.  Social Hx   Social History   Social History  . Marital Status: Married    Spouse Name: N/A  . Number of Children: 3  . Years of Education: HSG   Occupational History  . 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 Smoking status: Former Smoker -- 0.50 packs/day for 30 years    Types: Pipe, Cigars    Quit date: 08/20/1978  . Smokeless tobacco: Former Systems developer    Types: Chew    Quit date: 11/16/1990  . Alcohol Use: No  . Drug Use: No  . Sexual Activity: Yes   Other Topics Concern  . None   Social History Narrative   HSG.   Married 1948. 3 sons - '52, '57,'58- 5 grandchildren, one deceased at 27 - OD.  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 Marriage in good health. End of Life Care -DNR/DNI,  No prolonged heroic or futile measures.    Review of Systems  Constitutional: Negative.   Gastrointestinal: Positive for diarrhea and constipation.  Neurological:       Memory difficulties.   Objective:  BP 136/84 mmHg  Pulse 58  Temp(Src) 97.5 F  (36.4 C) (Oral)  Ht 5\' 5"  (1.651 m)  Wt 168 lb (76.204 kg)  BMI 27.96 kg/m2  SpO2 94%  BP/Weight 09/14/2015 08/04/2015 [id number]  Systolic BP [id number]  Diastolic BP 84 60 52  Wt. (Lbs) 168 162 165  BMI 27.96 26.96 27.46   Physical Exam  Constitutional: He appears well-developed. No distress.  Cardiovascular: Normal rate and regular rhythm.   Pulmonary/Chest: Effort normal and breath sounds normal.  Abdominal: Soft. He exhibits no distension and no mass. There is no tenderness. There is no rebound and no guarding.  Neurological: He is alert.  Answers questions appropriately.   Psychiatric:  Flat affect.   Vitals reviewed.  Lab Results  Component Value Date   WBC 6.0 02/06/2015   HGB 12.6* 02/06/2015   HCT 38.1* 02/06/2015   PLT 188 02/06/2015   GLUCOSE 107* 02/06/2015   CHOL 143 10/07/2013   TRIG 79 10/07/2013   HDL 44 10/07/2013   LDLCALC 83 10/07/2013   ALT 13* 02/06/2015   AST 16 02/06/2015   NA 141 02/06/2015   K 4.0 02/06/2015   CL 107 02/06/2015   CREATININE 1.11 02/06/2015   BUN 23* 02/06/2015   CO2 29 02/06/2015   INR 1.06 02/06/2015   Assessment & Plan:   Problem List Items Addressed  This Visit    Hypertension    Well controlled. Continue Norvasc daily.       Dementia, vascular - Primary    Chronic. Stable. Wife states that she seemed little improvement with Aricept. I informed her that she may not this may just slow progression. I discussed discontinuation of therapy given patient's current GI issues and she prefers to continue him on it for the time being. I informed her that his GI symptoms are likely the result of this medication but she is not in agreement. Will continue for now      Alternating constipation and diarrhea    New problem. Likely related to recent start of Aricept. I encouraged the wife to consider discontinuation. See separate problem. Patient had a successful and soft bowel movement during our office visit. Advised PRN  Miralax.        Follow-up: 3-6 months.  [place]

## 2015-09-14 NOTE — Progress Notes (Signed)
Pre visit review using our clinic review tool, if applicable. No additional management support is needed unless otherwise documented below in the visit note. 

## 2015-09-14 NOTE — Patient Instructions (Signed)
Use miralax daily.  I would recommend stopping the Aricept.  Follow up in 3-6 months.  Take care  Dr. Lacinda Axon

## 2015-09-14 NOTE — Assessment & Plan Note (Signed)
Well controlled. Continue Norvasc daily.

## 2015-09-15 ENCOUNTER — Ambulatory Visit: Payer: Medicare Other | Admitting: Family Medicine

## 2015-10-15 DIAGNOSIS — Z743 Need for continuous supervision: Secondary | ICD-10-CM | POA: Diagnosis not present

## 2015-10-15 DIAGNOSIS — R509 Fever, unspecified: Secondary | ICD-10-CM | POA: Diagnosis not present

## 2015-10-16 ENCOUNTER — Encounter: Payer: Self-pay | Admitting: Emergency Medicine

## 2015-10-16 ENCOUNTER — Inpatient Hospital Stay
Admission: EM | Admit: 2015-10-16 | Discharge: 2015-10-19 | DRG: 871 | Disposition: A | Discharge status: Home / Self Care | Payer: Medicare Other | Attending: Internal Medicine | Admitting: Internal Medicine

## 2015-10-16 ENCOUNTER — Emergency Department: Payer: Medicare Other

## 2015-10-16 DIAGNOSIS — J101 Influenza due to other identified influenza virus with other respiratory manifestations: Secondary | ICD-10-CM | POA: Diagnosis not present

## 2015-10-16 DIAGNOSIS — R451 Restlessness and agitation: Secondary | ICD-10-CM | POA: Diagnosis present

## 2015-10-16 DIAGNOSIS — K219 Gastro-esophageal reflux disease without esophagitis: Secondary | ICD-10-CM | POA: Diagnosis present

## 2015-10-16 DIAGNOSIS — E785 Hyperlipidemia, unspecified: Secondary | ICD-10-CM | POA: Diagnosis present

## 2015-10-16 DIAGNOSIS — J189 Pneumonia, unspecified organism: Secondary | ICD-10-CM | POA: Diagnosis not present

## 2015-10-16 DIAGNOSIS — R0902 Hypoxemia: Secondary | ICD-10-CM

## 2015-10-16 DIAGNOSIS — H409 Unspecified glaucoma: Secondary | ICD-10-CM | POA: Diagnosis present

## 2015-10-16 DIAGNOSIS — J9601 Acute respiratory failure with hypoxia: Secondary | ICD-10-CM | POA: Diagnosis present

## 2015-10-16 DIAGNOSIS — A4189 Other specified sepsis: Principal | ICD-10-CM | POA: Diagnosis present

## 2015-10-16 DIAGNOSIS — I10 Essential (primary) hypertension: Secondary | ICD-10-CM | POA: Diagnosis present

## 2015-10-16 DIAGNOSIS — D6489 Other specified anemias: Secondary | ICD-10-CM | POA: Diagnosis present

## 2015-10-16 DIAGNOSIS — N179 Acute kidney failure, unspecified: Secondary | ICD-10-CM | POA: Diagnosis present

## 2015-10-16 DIAGNOSIS — J111 Influenza due to unidentified influenza virus with other respiratory manifestations: Secondary | ICD-10-CM | POA: Diagnosis not present

## 2015-10-16 DIAGNOSIS — A419 Sepsis, unspecified organism: Secondary | ICD-10-CM | POA: Diagnosis present

## 2015-10-16 DIAGNOSIS — Z87891 Personal history of nicotine dependence: Secondary | ICD-10-CM | POA: Diagnosis not present

## 2015-10-16 DIAGNOSIS — J09X1 Influenza due to identified novel influenza A virus with pneumonia: Secondary | ICD-10-CM | POA: Diagnosis present

## 2015-10-16 DIAGNOSIS — Y95 Nosocomial condition: Secondary | ICD-10-CM | POA: Diagnosis present

## 2015-10-16 DIAGNOSIS — N400 Enlarged prostate without lower urinary tract symptoms: Secondary | ICD-10-CM | POA: Diagnosis present

## 2015-10-16 DIAGNOSIS — F0150 Vascular dementia without behavioral disturbance: Secondary | ICD-10-CM | POA: Diagnosis present

## 2015-10-16 DIAGNOSIS — Z66 Do not resuscitate: Secondary | ICD-10-CM | POA: Diagnosis present

## 2015-10-16 DIAGNOSIS — R509 Fever, unspecified: Secondary | ICD-10-CM | POA: Diagnosis not present

## 2015-10-16 DIAGNOSIS — R05 Cough: Secondary | ICD-10-CM | POA: Diagnosis not present

## 2015-10-16 HISTORY — DX: Unspecified dementia, unspecified severity, without behavioral disturbance, psychotic disturbance, mood disturbance, and anxiety: F03.90

## 2015-10-16 LAB — CBC WITH DIFFERENTIAL/PLATELET
BASOS PCT: 1 %
Basophils Absolute: 0.1 10*3/uL (ref 0–0.1)
Basophils Absolute: 0.1 10*3/uL (ref 0–0.1)
Basophils Relative: 1 %
EOS ABS: 0 10*3/uL (ref 0–0.7)
Eosinophils Absolute: 0 10*3/uL (ref 0–0.7)
Eosinophils Relative: 0 %
HCT: 25.5 % — ABNORMAL LOW (ref 40.0–52.0)
HEMATOCRIT: 31.5 % — AB (ref 40.0–52.0)
HEMOGLOBIN: 10.4 g/dL — AB (ref 13.0–18.0)
Hemoglobin: 8.6 g/dL — ABNORMAL LOW (ref 13.0–18.0)
Lymphocytes Relative: 2 %
Lymphocytes Relative: 6 %
Lymphs Abs: 0.2 10*3/uL — ABNORMAL LOW (ref 1.0–3.6)
Lymphs Abs: 0.7 10*3/uL — ABNORMAL LOW (ref 1.0–3.6)
MCH: 32.3 pg (ref 26.0–34.0)
MCH: 32.1 pg (ref 26.0–34.0)
MCHC: 33.1 g/dL (ref 32.0–36.0)
MCHC: 33.5 g/dL (ref 32.0–36.0)
MCV: 95.7 fL (ref 80.0–100.0)
MCV: 97.5 fL (ref 80.0–100.0)
MONO ABS: 0.9 10*3/uL (ref 0.2–1.0)
MONOS PCT: 7 %
Monocytes Absolute: 1 10*3/uL (ref 0.2–1.0)
Neutro Abs: 8.5 10*3/uL — ABNORMAL HIGH (ref 1.4–6.5)
Neutro Abs: 11.2 10*3/uL — ABNORMAL HIGH (ref 1.4–6.5)
Neutrophils Relative %: 87 %
Neutrophils Relative %: 86 %
PLATELETS: 169 10*3/uL (ref 150–440)
Platelets: 202 10*3/uL (ref 150–440)
RBC: 2.67 MIL/uL — ABNORMAL LOW (ref 4.40–5.90)
RBC: 3.23 MIL/uL — AB (ref 4.40–5.90)
RDW: 16.1 % — ABNORMAL HIGH (ref 11.5–14.5)
RDW: 15.9 % — AB (ref 11.5–14.5)
WBC: 12.9 10*3/uL — ABNORMAL HIGH (ref 3.8–10.6)
WBC: 9.7 10*3/uL (ref 3.8–10.6)

## 2015-10-16 LAB — COMPREHENSIVE METABOLIC PANEL
ALT: 44 U/L (ref 17–63)
AST: 39 U/L (ref 15–41)
Albumin: 4.1 g/dL (ref 3.5–5.0)
Alkaline Phosphatase: 84 U/L (ref 38–126)
Anion gap: 10 (ref 5–15)
BUN: 27 mg/dL — ABNORMAL HIGH (ref 6–20)
CHLORIDE: 105 mmol/L (ref 101–111)
CO2: 23 mmol/L (ref 22–32)
Calcium: 9.2 mg/dL (ref 8.9–10.3)
Creatinine, Ser: 1.41 mg/dL — ABNORMAL HIGH (ref 0.61–1.24)
GFR calc Af Amer: 48 mL/min — ABNORMAL LOW (ref >60)
GFR, EST NON AFRICAN AMERICAN: 41 mL/min — AB (ref >60)
Glucose, Bld: 145 mg/dL — ABNORMAL HIGH (ref 65–99)
Potassium: 4.3 mmol/L (ref 3.5–5.1)
Sodium: 138 mmol/L (ref 135–145)
Total Bilirubin: 0.5 mg/dL (ref 0.3–1.2)
Total Protein: 7.8 g/dL (ref 6.5–8.1)

## 2015-10-16 LAB — EXPECTORATED SPUTUM ASSESSMENT W REFEX TO RESP CULTURE

## 2015-10-16 LAB — BASIC METABOLIC PANEL
Anion gap: 6 (ref 5–15)
BUN: 26 mg/dL — ABNORMAL HIGH (ref 6–20)
CALCIUM: 8.4 mg/dL — AB (ref 8.9–10.3)
CO2: 23 mmol/L (ref 22–32)
CREATININE: 1.45 mg/dL — AB (ref 0.61–1.24)
Chloride: 109 mmol/L (ref 101–111)
GFR, EST AFRICAN AMERICAN: 46 mL/min — AB (ref >60)
GFR, EST NON AFRICAN AMERICAN: 40 mL/min — AB (ref >60)
GLUCOSE: 124 mg/dL — AB (ref 65–99)
Potassium: 3.7 mmol/L (ref 3.5–5.1)
Sodium: 138 mmol/L (ref 135–145)

## 2015-10-16 LAB — URINALYSIS COMPLETE WITH MICROSCOPIC (ARMC ONLY)
Bacteria, UA: NONE SEEN
Bilirubin Urine: NEGATIVE
Glucose, UA: NEGATIVE mg/dL
KETONES UR: NEGATIVE mg/dL
LEUKOCYTES UA: NEGATIVE
NITRITE: NEGATIVE
PROTEIN: NEGATIVE mg/dL
SPECIFIC GRAVITY, URINE: 1.017 (ref 1.005–1.030)
pH: 5 (ref 5.0–8.0)

## 2015-10-16 LAB — BRAIN NATRIURETIC PEPTIDE: B NATRIURETIC PEPTIDE 5: 57 pg/mL (ref 0.0–100.0)

## 2015-10-16 LAB — EXPECTORATED SPUTUM ASSESSMENT W GRAM STAIN, RFLX TO RESP C

## 2015-10-16 LAB — RAPID INFLUENZA A&B ANTIGENS
Influenza A (ARMC): DETECTED
Influenza B (ARMC): NOT DETECTED

## 2015-10-16 LAB — MRSA PCR SCREENING: MRSA BY PCR: NEGATIVE

## 2015-10-16 LAB — LACTIC ACID, PLASMA: Lactic Acid, Venous: 1.4 mmol/L (ref 0.5–2.0)

## 2015-10-16 MED ORDER — CETYLPYRIDINIUM CHLORIDE 0.05 % MT LIQD
7.0000 mL | Freq: Two times a day (BID) | OROMUCOSAL | Status: DC
  Administered 2015-10-17 – 2015-10-18 (×3): 7 mL via OROMUCOSAL

## 2015-10-16 MED ORDER — PIPERACILLIN-TAZOBACTAM 3.375 G IVPB
3.3750 g | Freq: Three times a day (TID) | INTRAVENOUS | Status: DC
  Administered 2015-10-16 – 2015-10-18 (×7): 3.375 g via INTRAVENOUS
  Filled 2015-10-16 (×9): qty 50

## 2015-10-16 MED ORDER — DOXAZOSIN MESYLATE 4 MG PO TABS
8.0000 mg | ORAL_TABLET | Freq: Every day | ORAL | Status: DC
  Administered 2015-10-16 – 2015-10-19 (×4): 8 mg via ORAL
  Filled 2015-10-16 (×4): qty 2

## 2015-10-16 MED ORDER — VANCOMYCIN HCL IN DEXTROSE 1-5 GM/200ML-% IV SOLN
1000.0000 mg | Freq: Once | INTRAVENOUS | Status: AC
  Administered 2015-10-16: 1000 mg via INTRAVENOUS
  Filled 2015-10-16: qty 200

## 2015-10-16 MED ORDER — DORZOLAMIDE HCL-TIMOLOL MAL 2-0.5 % OP SOLN
1.0000 [drp] | Freq: Two times a day (BID) | OPHTHALMIC | Status: DC
  Administered 2015-10-16 – 2015-10-19 (×7): 1 [drp] via OPHTHALMIC
  Filled 2015-10-16: qty 10

## 2015-10-16 MED ORDER — DONEPEZIL HCL 5 MG PO TABS
10.0000 mg | ORAL_TABLET | Freq: Every day | ORAL | Status: DC
  Administered 2015-10-16 – 2015-10-18 (×3): 10 mg via ORAL
  Filled 2015-10-16 (×3): qty 2

## 2015-10-16 MED ORDER — LATANOPROST 0.005 % OP SOLN
1.0000 [drp] | Freq: Two times a day (BID) | OPHTHALMIC | Status: DC
  Administered 2015-10-16 – 2015-10-19 (×7): 1 [drp] via OPHTHALMIC
  Filled 2015-10-16: qty 2.5

## 2015-10-16 MED ORDER — IBUPROFEN 400 MG PO TABS: 400.0000 mg | ORAL_TABLET | Freq: Four times a day (QID) | ORAL | Status: DC | PRN

## 2015-10-16 MED ORDER — ONDANSETRON HCL 4 MG/2ML IJ SOLN
4.0000 mg | Freq: Four times a day (QID) | INTRAMUSCULAR | Status: DC | PRN
  Filled 2015-10-16: qty 2

## 2015-10-16 MED ORDER — ACETAMINOPHEN 325 MG PO TABS: 650.0000 mg | ORAL_TABLET | Freq: Four times a day (QID) | ORAL | Status: DC | PRN

## 2015-10-16 MED ORDER — VANCOMYCIN HCL IN DEXTROSE 1-5 GM/200ML-% IV SOLN
1000.0000 mg | INTRAVENOUS | Status: DC
  Filled 2015-10-16: qty 200

## 2015-10-16 MED ORDER — ONDANSETRON HCL 4 MG PO TABS: 4.0000 mg | ORAL_TABLET | Freq: Four times a day (QID) | ORAL | Status: DC | PRN

## 2015-10-16 MED ORDER — SODIUM CHLORIDE 0.9% FLUSH
3.0000 mL | Freq: Two times a day (BID) | INTRAVENOUS | Status: DC
  Administered 2015-10-16 – 2015-10-18 (×4): 3 mL via INTRAVENOUS

## 2015-10-16 MED ORDER — OSELTAMIVIR PHOSPHATE 75 MG PO CAPS
75.0000 mg | ORAL_CAPSULE | Freq: Once | ORAL | Status: AC
  Administered 2015-10-16: 75 mg via ORAL
  Filled 2015-10-16: qty 1

## 2015-10-16 MED ORDER — IBUPROFEN 800 MG PO TABS
800.0000 mg | ORAL_TABLET | Freq: Once | ORAL | Status: AC
  Administered 2015-10-16: 800 mg via ORAL
  Filled 2015-10-16: qty 1

## 2015-10-16 MED ORDER — CHLORHEXIDINE GLUCONATE 0.12 % MT SOLN
15.0000 mL | Freq: Two times a day (BID) | OROMUCOSAL | Status: DC
  Administered 2015-10-16 – 2015-10-18 (×6): 15 mL via OROMUCOSAL
  Filled 2015-10-16 (×13): qty 15

## 2015-10-16 MED ORDER — SODIUM CHLORIDE 0.9 % IV BOLUS (SEPSIS)
500.0000 mL | Freq: Once | INTRAVENOUS | Status: AC
  Administered 2015-10-16: 500 mL via INTRAVENOUS

## 2015-10-16 MED ORDER — ENOXAPARIN SODIUM 40 MG/0.4ML ~~LOC~~ SOLN
40.0000 mg | SUBCUTANEOUS | Status: DC
  Administered 2015-10-16 – 2015-10-18 (×3): 40 mg via SUBCUTANEOUS
  Filled 2015-10-16 (×3): qty 0.4

## 2015-10-16 MED ORDER — OSELTAMIVIR PHOSPHATE 30 MG PO CAPS
30.0000 mg | ORAL_CAPSULE | Freq: Two times a day (BID) | ORAL | Status: DC
  Administered 2015-10-16 – 2015-10-19 (×7): 30 mg via ORAL
  Filled 2015-10-16 (×8): qty 1

## 2015-10-16 MED ORDER — SODIUM CHLORIDE 0.9 % IV SOLN
INTRAVENOUS | Status: DC
  Administered 2015-10-16: 04:00:00 via INTRAVENOUS

## 2015-10-16 MED ORDER — OSELTAMIVIR PHOSPHATE 75 MG PO CAPS: 75.0000 mg | ORAL_CAPSULE | Freq: Two times a day (BID) | ORAL | Status: DC

## 2015-10-16 MED ORDER — BRIMONIDINE TARTRATE 0.15 % OP SOLN
1.0000 [drp] | Freq: Three times a day (TID) | OPHTHALMIC | Status: DC
  Administered 2015-10-16 – 2015-10-19 (×9): 1 [drp] via OPHTHALMIC
  Filled 2015-10-16: qty 5

## 2015-10-16 MED ORDER — PANTOPRAZOLE SODIUM 40 MG PO TBEC
40.0000 mg | DELAYED_RELEASE_TABLET | Freq: Every day | ORAL | Status: DC
  Administered 2015-10-16 – 2015-10-19 (×4): 40 mg via ORAL
  Filled 2015-10-16 (×4): qty 1

## 2015-10-16 MED ORDER — ACETAMINOPHEN 650 MG RE SUPP: 650.0000 mg | Freq: Four times a day (QID) | RECTAL | Status: DC | PRN

## 2015-10-16 NOTE — ED Notes (Signed)
Patient returned from XR. 

## 2015-10-16 NOTE — Progress Notes (Signed)
Pharmacy Antibiotic Note  Alexander Goodwin is a 82 y.o. male admitted on 10/16/2015 with pneumonia.  Pharmacy has been consulted for vancomycin and Zosyn dosing.  Plan: DW 71kg  Vd 50L kei 0.031 hr-1  t1/2 22 hours Vancomycin 1 gram q 24 hours ordered with stacked dosing. Level before 5th dose. Goal 15-20.  Zosyn 3.375 grams q 8 hours ordered.  Height: 5\' 9"  (175.3 cm) Weight: 156 lb 9.6 oz (71.033 kg) IBW/kg (Calculated) : 70.7  Temp (24hrs), Avg:101 F (38.3 C), Min:97.8 F (36.6 C), Max:104.1 F (40.1 C)   Recent Labs Lab 10/16/15 0015 10/16/15 0016 10/16/15 0417  WBC  --  9.7 12.9*  CREATININE  --  1.41* 1.45*  LATICACIDVEN 1.4  --   --     Estimated Creatinine Clearance: 31.8 mL/min (by C-G formula based on Cr of 1.45).    Allergies  Allergen Reactions  . Clindamycin/Lincomycin     itching  . Erythromycin Base Rash    All mycins    Antimicrobials this admission:   >>    >>   Dose adjustments this admission:   Microbiology results: 2/26 BCx: pending 2/26 UCx: pending  2/26 Sputum: pending  2/26 MRSA PCR: (-)  2/26 UA: (-) 2/26 CXR: R upper and mid lobe opacities  Thank you for allowing pharmacy to be a part of this patient's care.  Iyana Topor S 10/16/2015 5:24 AM

## 2015-10-16 NOTE — H&P (Signed)
[facility] [place] NAME: Alexander Goodwin    MR#:  [id number]  DATE OF BIRTH:  [birth year]  DATE OF ADMISSION:  10/16/2015  PRIMARY CARE PHYSICIAN: Thersa Salt, DO   REQUESTING/REFERRING PHYSICIAN: Burlene Arnt, M.D.  CHIEF COMPLAINT:   Chief Complaint  Patient presents with  . Code Sepsis    HISTORY OF PRESENT ILLNESS:  Alexander Goodwin  is a 82 y.o. male who presents with fever, chills, cough. Patient contributes some to his history, though more collateral information is taken from his son who is present at bedside in the ED. Patient presents from nursing home after having an acute episode of chills with some confusion earlier today. He also started with a cough today. Here in the ED he was found to have an initial temperature 104. Further workup revealed influenza A positive, chest x-ray with right upper mid lung opacity likely pneumonia, and CBC with normal white count but significant bandemia. Patient was tachycardic and tachypneic and arrived here. Hospitalists were called for sepsis.  PAST MEDICAL HISTORY:   Past Medical History  Diagnosis Date  . Diverticulitis of colon   . History of colon polyps   . Arthritis     right knee, hips, neck. He denies any major limitation in activities  . GERD (gastroesophageal reflux disease)     well controlled with medication. No nocturnal symptoms on a regular basis  . Glaucoma     both eyes, followed closely by Dr. Janyth Contes  . Esophagitis     Last EGD Fall '11   . 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 Pancreatitis     resolved after GB surgery  . Stroke [facility])     TIAs - age 41  . BPH (benign prostatic hypertrophy) with urinary obstruction   . Hyperlipemia   . Hypertension   . Seasonal allergies   . Dysphagia, oropharyngeal     PAST SURGICAL HISTORY:   Past Surgical History   Procedure Laterality Date  . Cholecystectomy      '88 - laparotomy ([place])  . Appendectomy      '88 along with GB  . Bronchoscopy  1993  . Video bronchoscopy  03/26/2012    Procedure: VIDEO BRONCHOSCOPY WITH FLUORO;  Surgeon: Elsie Stain, MD;  Location: [facility];  Service: Cardiopulmonary;  Laterality: Bilateral;    SOCIAL HISTORY:   Social History  Substance Use Topics  . Smoking status: Former Smoker -- 0.50 packs/day for 30 years    Types: Pipe, Cigars    Quit date: 08/20/1978  . Smokeless tobacco: Former Systems developer    Types: Chew    Quit date: 11/16/1990  . Alcohol Use: No    FAMILY HISTORY:   Family History  Problem Relation Age of Onset  . Diabetes Mother   . Hypertension Mother   . Heart disease Mother   . Stroke Father   . Heart disease Father   . Diabetes Father   . Rheum arthritis Father   . Arthritis Father   . Cancer Sister     uterine cancer  . COPD Brother   . Hypertension Brother   . Cancer Brother     colon. brain, and hip  . Diabetes Brother   . Cancer Brother   . Diabetes Brother   . Cancer Sister     gyn malignancy  .  Cancer Sister     pancreatic cancer  . Allergies Sister   . Allergies Son   . Asthma Sister   . Asthma Son   . Diabetes Maternal Grandmother   . Diabetes Maternal Grandfather   . Diabetes Paternal Grandmother   . Diabetes Paternal Grandfather     DRUG ALLERGIES:   Allergies  Allergen Reactions  . Clindamycin/Lincomycin     itching  . Erythromycin Base Rash    All mycins    MEDICATIONS AT HOME:   Prior to Admission medications   Medication Sig Start Date End Date Taking? Authorizing Provider  acetaminophen (TYLENOL ARTHRITIS PAIN) 650 MG CR tablet Take 650 mg by mouth every 8 (eight) hours as needed.     Yes Historical Provider, MD  amLODipine (NORVASC) 10 MG tablet Take 10 mg by mouth daily.     Yes Historical Provider, MD  AZOPT 1 % ophthalmic suspension Place 1 drop into both eyes 2 (two) times daily.    Yes Historical Provider, MD  beta carotene w/minerals (OCUVITE) tablet Take 1 tablet by mouth daily.   Yes Historical Provider, MD  brimonidine (ALPHAGAN P) 0.1 % SOLN Place 1 drop into both eyes 3 (three) times daily.    Yes Historical Provider, MD  calcium carbonate (TUMS - DOSED IN MG ELEMENTAL CALCIUM) 500 MG chewable tablet Chew 1 tablet by mouth as needed for indigestion or heartburn.   Yes Historical Provider, MD  calcium-vitamin D (OSCAL 500/200 D-3) 500-200 MG-UNIT per tablet Take 1 tablet by mouth daily.    Yes Historical Provider, MD  donepezil (ARICEPT) 10 MG tablet Take 10 mg by mouth at bedtime.   Yes Historical Provider, MD  dorzolamide-timolol (COSOPT) 22.3-6.8 MG/ML ophthalmic solution Place 1 drop into both eyes 2 (two) times daily.   Yes Historical Provider, MD  doxazosin (CARDURA) 8 MG tablet Take 8 mg by mouth daily.   Yes Historical Provider, MD  gabapentin (NEURONTIN) 300 MG capsule Take 300 mg by mouth 2 (two) times daily as needed (for shingles).   Yes Historical Provider, MD  Iron, Ferrous Gluconate, 256 (28 FE) MG TABS Take 1 tablet by mouth daily.   Yes Historical Provider, MD  latanoprost (XALATAN) 0.005 % ophthalmic solution Place 1 drop into both eyes 2 (two) times daily.    Yes Historical Provider, MD  omeprazole (PRILOSEC) 20 MG capsule Take 20 mg by mouth daily.   Yes Historical Provider, MD  sucralfate (CARAFATE) 1 G tablet Take 1 tablet by mouth 2 (two) times daily before a meal. 01/17/15  Yes Historical Provider, MD  donepezil (ARICEPT) 5 MG tablet TAKE ONE TABLET AT BEDTIME MAY INCREASE TO TAKE TWO TABLETS AT BEDTIME AFTER -FOUR TO SIX WEEKS Patient not taking: Reported on 10/16/2015 08/19/15   Coral Spikes, DO    REVIEW OF SYSTEMS:  Review of Systems  Constitutional: Positive for fever and chills. Negative for weight loss and malaise/fatigue.  HENT: Negative for ear pain, hearing loss and tinnitus.   Eyes: Negative for blurred vision, double vision, pain and  redness.  Respiratory: Positive for cough and wheezing. Negative for hemoptysis and shortness of breath.   Cardiovascular: Negative for chest pain, palpitations, orthopnea and leg swelling.  Gastrointestinal: Negative for nausea, vomiting, abdominal pain, diarrhea and constipation.  Genitourinary: Negative for dysuria, frequency and hematuria.  Musculoskeletal: Negative for back pain, joint pain and neck pain.  Skin:       No acne, rash, or lesions  Neurological: Negative for dizziness,  tremors, focal weakness and weakness.  Endo/Heme/Allergies: Negative for polydipsia. Does not bruise/bleed easily.  Psychiatric/Behavioral: Negative for depression. The patient is not nervous/anxious and does not have insomnia.      VITAL SIGNS:   Filed Vitals:   10/16/15 0010 10/16/15 0026 10/16/15 0100  BP: 157/71  116/55  Pulse: 102  91  Temp:  104.1 F (40.1 C)   TempSrc:  Rectal   Resp: 22  21  Height: 5\' 9"  (1.753 m)    Weight: 73.483 kg (162 lb)    SpO2: 99%  95%   Wt Readings from Last 3 Encounters:  10/16/15 73.483 kg (162 lb)  09/14/15 76.204 kg (168 lb)  08/04/15 73.483 kg (162 lb)    PHYSICAL EXAMINATION:  Physical Exam  Vitals reviewed. Constitutional: He appears well-developed and well-nourished. No distress.  HENT:  Head: Normocephalic and atraumatic.  Mouth/Throat: Oropharynx is clear and moist.  Eyes: Conjunctivae and EOM are normal. Pupils are equal, round, and reactive to light. No scleral icterus.  Neck: Normal range of motion. Neck supple. No JVD present. No thyromegaly present.  Cardiovascular: Normal rate, regular rhythm and intact distal pulses.  Exam reveals no gallop and no friction rub.   No murmur heard. Respiratory: Effort normal and breath sounds normal. No respiratory distress. He has no wheezes. He has no rales.  GI: Soft. Bowel sounds are normal. He exhibits no distension. There is no tenderness.  Musculoskeletal: Normal range of motion. He exhibits no  edema.  No arthritis, no gout  Lymphadenopathy:    He has no cervical adenopathy.  Neurological: He is alert. No cranial nerve deficit.  Patient able to respond appropriately intermittently. No dysarthria, no aphasia  Skin: Skin is warm and dry. No rash noted. No erythema.  Psychiatric:  Unable to assess due to patient condition    LABORATORY PANEL:   CBC  Recent Labs Lab 10/16/15 0016  WBC 9.7  HGB 10.4*  HCT 31.5*  PLT 202   ------------------------------------------------------------------------------------------------------------------  Chemistries   Recent Labs Lab 10/16/15 0016  NA 138  K 4.3  CL 105  CO2 23  GLUCOSE 145*  BUN 27*  CREATININE 1.41*  CALCIUM 9.2  AST 39  ALT 44  ALKPHOS 84  BILITOT 0.5   ------------------------------------------------------------------------------------------------------------------  Cardiac Enzymes No results for input(s): TROPONINI in the last 168 hours. ------------------------------------------------------------------------------------------------------------------  RADIOLOGY:  Dg Chest 2 View  10/16/2015  CLINICAL DATA:  Acute onset of confusion and generalized weakness. Fever and cough. Initial encounter. EXAM: CHEST  2 VIEW COMPARISON:  Chest radiograph from 09/30/2014 FINDINGS: The lungs are well-aerated. Right upper and midlung zone airspace opacity raises concern for pneumonia. There is no evidence of pleural effusion or pneumothorax. The heart is borderline normal in size. No acute osseous abnormalities are seen. IMPRESSION: Right upper and midlung zone airspace opacity raises concern for recurrent pneumonia. Followup PA and lateral chest X-ray is recommended in 3-4 weeks following trial of antibiotic therapy to ensure resolution and exclude underlying malignancy. Electronically Signed   By: Garald Balding M.D.   On: 10/16/2015 00:50    EKG:   Orders placed or performed during the hospital encounter of 10/16/15   . ED EKG  . ED EKG  . EKG 12-Lead  . EKG 12-Lead    IMPRESSION AND PLAN:  Principal Problem:   Sepsis ([place]) - Tamiflu given in the ED after influenza a positive result. We'll cover with antibiotics upfront as well, given his bandemia and chest x-ray findings. Blood  and urine cultures sent from ED. Hemodynamically stable with a normal lactic acid. We'll order sputum culture as well. Active Problems:   Influenza A - continue to treat with Tamiflu   HCAP (healthcare-associated pneumonia) - IV antibiotics and cultures as above   Hypertension - currently normotensive, hold home antihypertensives for now.   Dementia, vascular - continue home meds   AKI (acute kidney injury) ([place]) - mild, gentle fluid hydration   GERD (gastroesophageal reflux disease) - home dose PPI   BPH (benign prostatic hyperplasia) - continue home meds   Hyperlipemia - continue home meds  All the records are reviewed and case discussed with ED provider. Management plans discussed with the patient and/or family.  DVT PROPHYLAXIS: SubQ lovenox  GI PROPHYLAXIS: PPI  ADMISSION STATUS: Inpatient  CODE STATUS: DO NOT RESUSCITATE Code Status History    Date Active Date Inactive Code Status Order ID Comments User Context   02/06/2015 10:41 PM 02/07/2015  6:08 PM DNR [id number]  Lytle Butte, MD ED    Questions for Most Recent Historical Code Status (Order [id number])    Question Answer Comment   In the event of cardiac or respiratory ARREST Do not call a "code blue"    In the event of cardiac or respiratory ARREST Do not perform Intubation, CPR, defibrillation or ACLS    In the event of cardiac or respiratory ARREST Use medication by any route, position, wound care, and other measures to relive pain and suffering. May use oxygen, suction and manual treatment of airway obstruction as needed for comfort.     Advance Directive Documentation        Most Recent Value   Type of Advance Directive  Living will, Out of facility  DNR (pink MOST or yellow form), Healthcare Power of Attorney   Pre-existing out of facility DNR order (yellow form or pink MOST form)  Yellow form placed in chart (order not valid for inpatient use)   "MOST" Form in Place?        TOTAL TIME TAKING CARE OF THIS PATIENT: 45 minutes.    Zorana Brockwell Hettinger 10/16/2015, 2:15 AM  [facility]  Office  [phone]  CC: Primary care physician; Thersa Salt, DO

## 2015-10-16 NOTE — ED Provider Notes (Addendum)
[facility] Emergency Department Provider Note  ____________________________________________   I have reviewed the triage vital signs and the nursing notes.   HISTORY  Chief Complaint Code Sepsis    HPI Alexander Goodwin is a 82 y.o. male positive flu contact, patient is DNR/DNI. He has had a fever and cough today. Denies any headache stiff neck dysuria or urinary frequency or diarrhea. Not vomiting. Patient is not on baseline oxygen. Cough is occasionally productive but usually is dry. No sore throat.  Past Medical History  Diagnosis Date  . Diverticulitis of colon   . History of colon polyps   . Arthritis     right knee, hips, neck. He denies any major limitation in activities  . GERD (gastroesophageal reflux disease)     well controlled with medication. No nocturnal symptoms on a regular basis  . Glaucoma     both eyes, followed closely by Dr. Janyth Contes  . Esophagitis     Last EGD Fall '11   . Abscess of lung without pneumonia ([place])     Right upper lobe chronic lung abscess and prior left upper lobe lung abscess due to chronic aspiration  . Pancreatitis     resolved after GB surgery  . Stroke [facility])     TIAs - age 65  . BPH (benign prostatic hypertrophy) with urinary obstruction   . Hyperlipemia   . Hypertension   . Seasonal allergies   . Dysphagia, oropharyngeal     Patient Active Problem List   Diagnosis Date Noted  . Alternating constipation and diarrhea 09/14/2015  . Dementia, vascular 06/15/2015  . Aspiration into airway 03/15/2015  . DNR (do not resuscitate) 09/15/2014  . 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 Hyperlipemia   . Hypertension   . History of colon polyps   . Arthritis   . GERD (gastroesophageal reflux disease)   . Glaucoma   . History of esophagitis   . History of TIA (transient ischemic attack)   . BPH (benign prostatic hyperplasia)     Past  Surgical History  Procedure Laterality Date  . Cholecystectomy      '88 - laparotomy ([place])  . Appendectomy      '88 along with GB  . Bronchoscopy  1993  . Video bronchoscopy  03/26/2012    Procedure: VIDEO BRONCHOSCOPY WITH FLUORO;  Surgeon: Elsie Stain, MD;  Location: [facility];  Service: Cardiopulmonary;  Laterality: Bilateral;    Current Outpatient Rx  Name  Route  Sig  Dispense  Refill  . acetaminophen (TYLENOL ARTHRITIS PAIN) 650 MG CR tablet   Oral   Take 650 mg by mouth every 8 (eight) hours as needed.           Marland Kitchen amLODipine (NORVASC) 10 MG tablet   Oral   Take 10 mg by mouth daily.           . AZOPT 1 % ophthalmic suspension   Both Eyes   Place 1 drop into both eyes 2 (two) times daily.         . beta carotene w/minerals (OCUVITE) tablet   Oral   Take 1 tablet by mouth daily.         . brimonidine (ALPHAGAN P) 0.1 % SOLN   Both Eyes   Place 1 drop into both eyes 3 (three) times daily.          . calcium carbonate (TUMS -  DOSED IN MG ELEMENTAL CALCIUM) 500 MG chewable tablet   Oral   Chew 1 tablet by mouth as needed for indigestion or heartburn.         . calcium-vitamin D (OSCAL 500/200 D-3) 500-200 MG-UNIT per tablet   Oral   Take 1 tablet by mouth daily.          Marland Kitchen donepezil (ARICEPT) 10 MG tablet   Oral   Take 10 mg by mouth at bedtime.         . dorzolamide-timolol (COSOPT) 22.3-6.8 MG/ML ophthalmic solution   Both Eyes   Place 1 drop into both eyes 2 (two) times daily.         Marland Kitchen doxazosin (CARDURA) 8 MG tablet   Oral   Take 8 mg by mouth daily.         Marland Kitchen gabapentin (NEURONTIN) 300 MG capsule   Oral   Take 300 mg by mouth 2 (two) times daily as needed (for shingles).         . Iron, Ferrous Gluconate, 256 (28 FE) MG TABS   Oral   Take 1 tablet by mouth daily.         Marland Kitchen latanoprost (XALATAN) 0.005 % ophthalmic solution   Both Eyes   Place 1 drop into both eyes 2 (two) times daily.          Marland Kitchen omeprazole  (PRILOSEC) 20 MG capsule   Oral   Take 20 mg by mouth daily.         . sucralfate (CARAFATE) 1 G tablet   Oral   Take 1 tablet by mouth 2 (two) times daily before a meal.      0   . donepezil (ARICEPT) 5 MG tablet      TAKE ONE TABLET AT BEDTIME MAY INCREASE TO TAKE TWO TABLETS AT BEDTIME AFTER -FOUR TO SIX WEEKS Patient not taking: Reported on 10/16/2015   90 tablet   1     Allergies Clindamycin/lincomycin and Erythromycin base  Family History  Problem Relation Age of Onset  . Diabetes Mother   . Hypertension Mother   . Heart disease Mother   . Stroke Father   . Heart disease Father   . Diabetes Father   . Rheum arthritis Father   . Arthritis Father   . Cancer Sister     uterine cancer  . COPD Brother   . Hypertension Brother   . Cancer Brother     colon. brain, and hip  . Diabetes Brother   . Cancer Brother   . Diabetes Brother   . Cancer Sister     gyn malignancy  . Cancer Sister     pancreatic cancer  . Allergies Sister   . Allergies Son   . Asthma Sister   . Asthma Son   . Diabetes Maternal Grandmother   . Diabetes Maternal Grandfather   . Diabetes Paternal Grandmother   . Diabetes Paternal Grandfather     Social History Social History  Substance Use Topics  . Smoking status: Former Smoker -- 0.50 packs/day for 30 years    Types: Pipe, Cigars    Quit date: 08/20/1978  . Smokeless tobacco: Former Systems developer    Types: Chew    Quit date: 11/16/1990  . Alcohol Use: No    Review of Systems Constitutional: No fever/chills Eyes: No visual changes. ENT: No sore throat. No stiff neck no neck pain Cardiovascular: Denies chest pain. Respiratory: Denies shortness of breath. Gastrointestinal:  no vomiting.  No diarrhea.  No constipation. Genitourinary: Negative for dysuria. Musculoskeletal: Negative lower extremity swelling Skin: Negative for rash. Neurological: Negative for headaches, focal weakness or numbness. 10-point ROS otherwise  negative.  ____________________________________________   PHYSICAL EXAM:  VITAL SIGNS: ED Triage Vitals  Enc Vitals Group     BP 10/16/15 0010 157/71 mmHg     Pulse Rate 10/16/15 0010 102     Resp 10/16/15 0010 22     Temp 10/16/15 0026 104.1 F (40.1 C)     Temp Source 10/16/15 0026 Rectal     SpO2 10/16/15 0010 99 %     Weight 10/16/15 0010 162 lb (73.483 kg)     Height 10/16/15 0010 5\' 9"  (1.753 m)     Head Cir --      Peak Flow --      Pain Score 10/16/15 0011 0     Pain Loc --      Pain Edu? --      Excl. in [place]? --     Constitutional: Alert and oriented, able to tell me where he has the day of the week and provides a reasonable history.. Well appearing and in no acute distress. Eyes: Conjunctivae are normal. . EOMI. Head: Atraumatic. Nose: No congestion/rhinnorhea. Mouth/Throat: Mucous membranes are moist.  Oropharynx non-erythematous. Neck: No stridor.   Nontender with no meningismus Cardiovascular: Normal rate, regular rhythm. Grossly normal heart sounds.  Good peripheral circulation. Respiratory: Normal respiratory effort.  No retractions. Facial by basilar rhonchi appreciated. Abdominal: Soft and nontender. No distention. No guarding no rebound Back:  There is no focal tenderness or step off there is no midline tenderness there are no lesions noted. there is no CVA tenderness Musculoskeletal: No lower extremity tenderness. No joint effusions, no DVT signs strong distal pulses no edema Neurologic:  Normal speech and language. No gross focal neurologic deficits are appreciated.  Skin:  Skin is warm, dry and intact. No rash noted. Psychiatric: Mood and affect are normal. Speech and behavior are normal.  ____________________________________________   LABS (all labs ordered are listed, but only abnormal results are displayed)  Labs Reviewed  COMPREHENSIVE METABOLIC PANEL - Abnormal; Notable for the following:    Glucose, Bld 145 (*)    BUN 27 (*)    Creatinine,  Ser 1.41 (*)    GFR calc non Af Amer 41 (*)    GFR calc Af Amer 48 (*)    All other components within normal limits  CBC WITH DIFFERENTIAL/PLATELET - Abnormal; Notable for the following:    RBC 3.23 (*)    Hemoglobin 10.4 (*)    HCT 31.5 (*)    RDW 16.1 (*)    Neutro Abs 8.5 (*)    Lymphs Abs 0.2 (*)    All other components within normal limits  URINALYSIS COMPLETEWITH MICROSCOPIC ([facility] ONLY) - Abnormal; Notable for the following:    Color, Urine YELLOW (*)    APPearance CLEAR (*)    Hgb urine dipstick 1+ (*)    Squamous Epithelial / LPF 0-5 (*)    All other components within normal limits  RAPID INFLUENZA A&B ANTIGENS ([facility] ONLY)  CULTURE, BLOOD (ROUTINE X 2)  CULTURE, BLOOD (ROUTINE X 2)  URINE CULTURE  LACTIC ACID, PLASMA  BRAIN NATRIURETIC PEPTIDE  LACTIC ACID, PLASMA   ____________________________________________  EKG  I personally interpreted any EKGs ordered by me or triage Sinus tachycardia, rate 104 no acute ST elevation no acute ST depression nonspecific ST changes and  repolarization abnormality ____________________________________________  RADIOLOGY  I reviewed any imaging ordered by me or triage that were performed during my shift ____________________________________________   PROCEDURES  Procedure(s) performed: None  Critical Care performed: CRITICAL CARE Performed by: Schuyler Amor   Total critical care time: 38 minutes  Critical care time was exclusive of separately billable procedures and treating other patients.  Critical care was necessary to treat or prevent imminent or life-threatening deterioration.  Critical care was time spent personally by me on the following activities: development of treatment plan with patient and/or surrogate as well as nursing, discussions with consultants, evaluation of patient's response to treatment, examination of patient, obtaining history from patient or surrogate, ordering and performing treatments and  interventions, ordering and review of laboratory studies, ordering and review of radiographic studies, pulse oximetry and re-evaluation of patient's condition.   ____________________________________________   INITIAL IMPRESSION / ASSESSMENT AND PLAN / ED COURSE  Pertinent labs & imaging results that were available during my care of the patient were reviewed by me and considered in my medical decision making (see chart for details).  Patient with influenza.  However given his advanced age and oxygen requirement with this influenza, there is a very significant negative prognosis. We will give him Tamiflu, I will defer antibiotics at this time given viral etiology and hospitalist will admit ____________________________________________   FINAL CLINICAL IMPRESSION(S) / ED DIAGNOSES  Final diagnoses:  None      This chart was dictated using voice recognition software.  Despite best efforts to proofread,  errors can occur which can change meaning.     Schuyler Amor, MD 10/16/15 [id number]  Schuyler Amor, MD 10/16/15 [id number]

## 2015-10-16 NOTE — Progress Notes (Signed)
[place] [facility] [place] NAME: Alexander Goodwin    MR#:  [id number]  DATE OF BIRTH:  Aug 10, 1921  SUBJECTIVE:   Patient has fever of 104.1. He continues to have cough.  REVIEW OF SYSTEMS:    Review of Systems  Constitutional: Positive for fever and malaise/fatigue. Negative for chills.  HENT: Negative for sore throat.   Eyes: Negative for blurred vision.  Respiratory: Positive for cough. Negative for hemoptysis, shortness of breath and wheezing.   Cardiovascular: Negative for chest pain, palpitations and leg swelling.  Gastrointestinal: Negative for nausea, vomiting, abdominal pain, diarrhea and blood in stool.  Genitourinary: Negative for dysuria.  Musculoskeletal: Negative for back pain.  Neurological: Positive for weakness. Negative for dizziness, tremors and headaches.  Endo/Heme/Allergies: Does not bruise/bleed easily.    Tolerating Diet:yes      DRUG ALLERGIES:   Allergies  Allergen Reactions  . Clindamycin/Lincomycin     itching  . Erythromycin Base Rash    All mycins    VITALS:  Blood pressure 98/42, pulse 69, temperature 97.6 F (36.4 C), temperature source Oral, resp. rate 18, height 5\' 9"  (1.753 m), weight 71.033 kg (156 lb 9.6 oz), SpO2 96 %.  PHYSICAL EXAMINATION:   Physical Exam  Constitutional: He is oriented to person, place, and time and well-developed, well-nourished, and in no distress. No distress.  HENT:  Head: Normocephalic.  Eyes: No scleral icterus.  Neck: Normal range of motion. Neck supple. No JVD present. No tracheal deviation present.  Cardiovascular: Normal rate, regular rhythm and normal heart sounds.  Exam reveals no gallop and no friction rub.   No murmur heard. Pulmonary/Chest: Effort normal and breath sounds normal. No respiratory distress. He has no wheezes. He has no rales. He exhibits no tenderness.  Abdominal: Soft. Bowel sounds are normal. He exhibits no distension and no mass.  There is no tenderness. There is no rebound and no guarding.  Musculoskeletal: Normal range of motion. He exhibits no edema.  Neurological: He is alert and oriented to person, place, and time.  Skin: Skin is warm. No rash noted. No erythema.  Psychiatric: Affect and judgment normal.      LABORATORY PANEL:   CBC  Recent Labs Lab 10/16/15 0417  WBC 12.9*  HGB 8.6*  HCT 25.5*  PLT 169   ------------------------------------------------------------------------------------------------------------------  Chemistries   Recent Labs Lab 10/16/15 0016 10/16/15 0417  NA 138 138  K 4.3 3.7  CL 105 109  CO2 23 23  GLUCOSE 145* 124*  BUN 27* 26*  CREATININE 1.41* 1.45*  CALCIUM 9.2 8.4*  AST 39  --   ALT 44  --   ALKPHOS 84  --   BILITOT 0.5  --    ------------------------------------------------------------------------------------------------------------------  Cardiac Enzymes No results for input(s): TROPONINI in the last 168 hours. ------------------------------------------------------------------------------------------------------------------  RADIOLOGY:  Dg Chest 2 View  10/16/2015  CLINICAL DATA:  Acute onset of confusion and generalized weakness. Fever and cough. Initial encounter. EXAM: CHEST  2 VIEW COMPARISON:  Chest radiograph from 09/30/2014 FINDINGS: The lungs are well-aerated. Right upper and midlung zone airspace opacity raises concern for pneumonia. There is no evidence of pleural effusion or pneumothorax. The heart is borderline normal in size. No acute osseous abnormalities are seen. IMPRESSION: Right upper and midlung zone airspace opacity raises concern for recurrent pneumonia. Followup PA and lateral chest X-ray is recommended in 3-4 weeks following trial of antibiotic therapy to ensure resolution and exclude underlying malignancy. Electronically Signed  By: Garald Balding M.D.   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  1. Sepsis: Patient presents with fever, tachycardia and tachypnea. Influenza A was positive. Chest x-ray is consistent with right upper and mid lung pneumonia (unclear if this is from history of chronic right upper lobe Abscess). Due to sepsis and high fevers would treat aggressively. Continue Tamiflu and Zosyn. Patient has seen the [facility] pulmonary consult needed. He will need repeat chest x-ray in 4 weeks.  2. Essential hypertension: Holding outpatient medications due to sepsis and hypotension. 3. Vascular dementia: Continue Aricept.  4. BPH: Continue Cardura.  5. Glaucoma: Continue eyedrops.  6. Acute on chronic anemia: Hemoglobin is 8.6. Baseline is tender 11. Monitor no need for transfusion at this time.  Management plan discussed with patient. He does seem to understand plan. CODE STATUS: DNR  TOTAL TIME TAKING CARE OF THIS PATIENT: 30 minutes.     POSSIBLE D/C 2-3 days, DEPENDING ON CLINICAL CONDITION.   Cissy Galbreath M.D on 10/16/2015 at 9:31 AM  Between 7am to 6pm - Pager - [phone] After 6pm go to [email or web address] - password [email or web address] [facility]  Office  [phone]  CC: Primary care physician; Thersa Salt, DO  Note: This dictation was prepared with Dragon dictation along with smaller phrase technology. Any transcriptional errors that result from this process are unintentional.

## 2015-10-16 NOTE — ED Notes (Signed)
Pt woke approximately 1 hr ago feeling poorly, sxs of confusion, weakness, fever. Pt states was coughing all day Sat. Pt is pale, somewhat confused, warm to the touch, voice sounds congested. Wife diagnosed w/ flu several days ago. Upon EMS arrival, pt had SA O2 83% on room air w/ oral temp of 103.

## 2015-10-16 NOTE — ED Notes (Signed)
Hospitalist at bedside 

## 2015-10-17 LAB — CBC
HCT: 25.8 % — ABNORMAL LOW (ref 40.0–52.0)
HEMOGLOBIN: 8.4 g/dL — AB (ref 13.0–18.0)
MCH: 31.6 pg (ref 26.0–34.0)
MCHC: 32.7 g/dL (ref 32.0–36.0)
MCV: 96.7 fL (ref 80.0–100.0)
Platelets: 151 10*3/uL (ref 150–440)
RBC: 2.67 MIL/uL — AB (ref 4.40–5.90)
RDW: 16.4 % — ABNORMAL HIGH (ref 11.5–14.5)
WBC: 18.3 10*3/uL — ABNORMAL HIGH (ref 3.8–10.6)

## 2015-10-17 LAB — BASIC METABOLIC PANEL
Anion gap: 5 (ref 5–15)
BUN: 18 mg/dL (ref 6–20)
CHLORIDE: 106 mmol/L (ref 101–111)
CO2: 24 mmol/L (ref 22–32)
Calcium: 8.2 mg/dL — ABNORMAL LOW (ref 8.9–10.3)
Creatinine, Ser: 1.41 mg/dL — ABNORMAL HIGH (ref 0.61–1.24)
GFR, EST AFRICAN AMERICAN: 48 mL/min — AB (ref >60)
GFR, EST NON AFRICAN AMERICAN: 41 mL/min — AB (ref >60)
Glucose, Bld: 117 mg/dL — ABNORMAL HIGH (ref 65–99)
POTASSIUM: 3.6 mmol/L (ref 3.5–5.1)
SODIUM: 135 mmol/L (ref 135–145)

## 2015-10-17 LAB — URINE CULTURE

## 2015-10-17 MED ORDER — METHYLPREDNISOLONE SODIUM SUCC 125 MG IJ SOLR
60.0000 mg | Freq: Two times a day (BID) | INTRAMUSCULAR | Status: DC
  Administered 2015-10-17 – 2015-10-18 (×3): 60 mg via INTRAVENOUS
  Filled 2015-10-17 (×3): qty 2

## 2015-10-17 MED ORDER — LORAZEPAM 0.5 MG PO TABS
0.5000 mg | ORAL_TABLET | Freq: Three times a day (TID) | ORAL | Status: DC | PRN
  Administered 2015-10-17: 0.5 mg via ORAL
  Filled 2015-10-17: qty 1

## 2015-10-17 MED ORDER — IPRATROPIUM-ALBUTEROL 0.5-2.5 (3) MG/3ML IN SOLN: 3.0000 mL | RESPIRATORY_TRACT | Status: DC | PRN

## 2015-10-17 MED ORDER — HALOPERIDOL 1 MG PO TABS
1.0000 mg | ORAL_TABLET | ORAL | Status: DC | PRN
  Administered 2015-10-18: 1 mg via ORAL
  Filled 2015-10-17 (×2): qty 1

## 2015-10-17 NOTE — Evaluation (Signed)
Physical Therapy Evaluation Patient Details Name: Alexander Goodwin MRN: [id number] DOB: [birth year] Today's Date: 10/17/2015   History of Present Illness  Patient is a 82 y/o male that presents with hypoxia, found to have influenza A, also noted to have vascular dementia.   Clinical Impression  Patient presents from ALF with influenza, and is currently using 3.5L of O2. He is otherwise able to complete household mobility as expected at his baseline, with intermittent cuing required for safety and cuing for use of device as patient has dementia at baseline. Patient demonstrates flexed trunk posture, with decreased step lengths in this session, which also appears to be his baseline. No shortness of breath noted in this evaluation. Patient would benefit from additional PT services to address his current cardiopulmonary deficits with mobility.     Follow Up Recommendations Home health PT;Supervision for mobility/OOB    Equipment Recommendations       Recommendations for Other Services       Precautions / Restrictions Precautions Precautions: Fall Restrictions Weight Bearing Restrictions: No      Mobility  Bed Mobility Overal bed mobility: Needs Assistance Bed Mobility: Supine to Sit     Supine to sit: Min guard     General bed mobility comments: Patient is quite eager to exit bedside and is able to complete transfer in prolonged time with no loss of balance.   Transfers Overall transfer level: Needs assistance Equipment used: Rolling walker (2 wheeled) Transfers: Sit to/from Stand Sit to Stand: Min guard         General transfer comment: Patient has posterior lean throughout sit to stand, though no overt loss of balance noted.   Ambulation/Gait Ambulation/Gait assistance: Min guard Ambulation Distance (Feet): 90 Feet (90'+60') Assistive device: Rolling walker (2 wheeled) Gait Pattern/deviations: Decreased step length - right;Decreased step length - left;Narrow base of  support;Trunk flexed   Gait velocity interpretation: <1.8 ft/sec, indicative of risk for recurrent falls General Gait Details: Patient requires reminders to be closer to RW during ambulation, flexed trunk secondary to posterior chain weakness. He occassionally has LEs outside BOS of RW, though is able to correct with cuing from PT. No buckling or other loss of balance noted.   Stairs            Wheelchair Mobility    Modified Rankin (Stroke Patients Only)       Balance Overall balance assessment: Needs assistance Sitting-balance support: Bilateral upper extremity supported Sitting balance-Leahy Scale: Fair Sitting balance - Comments: During MMT, patient leans posteriorly, but maintains balance by use of UEs.  Postural control: Posterior lean Standing balance support: Bilateral upper extremity supported Standing balance-Leahy Scale: Fair Standing balance comment: Patient is able to make multiple turns in the room, though poor use of RW at times.                              Pertinent Vitals/Pain Pain Assessment: No/denies pain    Home Living Family/patient expects to be discharged to:: Assisted living               Home Equipment: Walker - 2 wheels      Prior Function Level of Independence: Independent with assistive device(s)         Comments: Patient is limited with information he can provide to PT, though it appears he ambulates with RW for household distances.      Hand Dominance  Extremity/Trunk Assessment   Upper Extremity Assessment: Overall WFL for tasks assessed           Lower Extremity Assessment: Overall WFL for tasks assessed         Communication   Communication: No difficulties  Cognition Arousal/Alertness: Awake/alert Behavior During Therapy: WFL for tasks assessed/performed Overall Cognitive Status: History of cognitive impairments - at baseline                      General Comments       Exercises        Assessment/Plan    PT Assessment Patient needs continued PT services  PT Diagnosis Difficulty walking;Generalized weakness   PT Problem List Decreased strength;Decreased safety awareness;Decreased knowledge of use of DME;Decreased activity tolerance;Decreased knowledge of precautions;Decreased balance;Cardiopulmonary status limiting activity;Decreased mobility  PT Treatment Interventions DME instruction;Gait training;Therapeutic activities;Therapeutic exercise;Balance training   PT Goals (Current goals can be found in the Care Plan section) Acute Rehab PT Goals Patient Stated Goal: To return home  PT Goal Formulation: With patient Time For Goal Achievement: 10/31/15 Potential to Achieve Goals: Good    Frequency Min 2X/week   Barriers to discharge        Co-evaluation               End of Session Equipment Utilized During Treatment: Gait belt;Oxygen Activity Tolerance: Patient tolerated treatment well Patient left: in chair;with call bell/phone within reach;with chair alarm set Nurse Communication: Mobility status         Time: 1520-1540 PT Time Calculation (min) (ACUTE ONLY): 20 min   Charges:   PT Evaluation $PT Eval Moderate Complexity: 1 Procedure     PT G Codes:       Kerman Passey, PT, DPT    10/17/2015, 3:55 PM

## 2015-10-17 NOTE — NC FL2 (Signed)
[place] LEVEL OF CARE SCREENING TOOL     IDENTIFICATION  Patient Name: Alexander Goodwin Birthdate: 04-23-1921 Sex: male Admission Date (Current Location): 10/16/2015  [place] and [place] Number:  Engineering geologist and Address:  [facility], [address], [place], [place] [address]      Provider Number: [id number]  Attending Physician Name and Address:  Hillary Bow, MD  Relative Name and Phone Number:       Current Level of Care: Hospital Recommended Level of Care: [place] Prior Approval Number:    Date Approved/Denied:   PASRR Number: [id number]  Discharge Plan: SNF    Current Diagnoses: Patient Active Problem List   Diagnosis Date Noted  . Sepsis ([place]) 10/16/2015  . Influenza A 10/16/2015  . HCAP (healthcare-associated pneumonia) 10/16/2015  . AKI (acute kidney injury) ([place]) 10/16/2015  . Alternating constipation and diarrhea 09/14/2015  . Dementia, vascular 06/15/2015  . Aspiration into airway 03/15/2015  . DNR (do not resuscitate) 09/15/2014  . 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 Hyperlipemia   . Hypertension   . History of colon polyps   . Arthritis   . GERD (gastroesophageal reflux disease)   . Glaucoma   . History of esophagitis   . History of TIA (transient ischemic attack)   . BPH (benign prostatic hyperplasia)     Orientation RESPIRATION BLADDER Height & Weight     Self, Situation, Place  Normal Continent Weight: 156 lb 9.6 oz (71.033 kg) Height:  5\' 9"  (175.3 cm)  BEHAVIORAL SYMPTOMS/MOOD NEUROLOGICAL BOWEL NUTRITION STATUS   (none)  (none) Continent Diet (heart healthy)  AMBULATORY STATUS COMMUNICATION OF NEEDS Skin   Limited Assist Verbally Normal                       Personal Care Assistance Level of Assistance  Bathing, Dressing Bathing Assistance: Limited assistance   Dressing Assistance:  Limited assistance     Functional Limitations Info             SPECIAL CARE FACTORS FREQUENCY  PT (By licensed PT)                    Contractures Contractures Info: Not present    Additional Factors Info  Code Status, Isolation Precautions Code Status Info: DNR       Isolation Precautions Info: droplet precautions for flu     Current Medications (10/17/2015):  This is the current hospital active medication list Current Facility-Administered Medications  Medication Dose Route Frequency Provider Last Rate Last Dose  . acetaminophen (TYLENOL) tablet 650 mg  650 mg Oral Q6H PRN Lance Coon, MD       Or  . acetaminophen (TYLENOL) suppository 650 mg  650 mg Rectal Q6H PRN Lance Coon, MD      . antiseptic oral rinse (CPC / CETYLPYRIDINIUM CHLORIDE 0.05%) solution 7 mL  7 mL Mouth Rinse q12n4p Lance Coon, MD   7 mL at 10/17/15 1200  . brimonidine (ALPHAGAN) 0.15 % ophthalmic solution 1 drop  1 drop Both Eyes TID Lance Coon, MD   1 drop at 10/17/15 0912  . chlorhexidine (PERIDEX) 0.12 % solution 15 mL  15 mL Mouth Rinse BID Lance Coon, MD   15 mL at 10/17/15 1211  . donepezil (ARICEPT) tablet 10 mg  10 mg Oral QHS Lance Coon, MD   10 mg at  10/16/15 2035  . dorzolamide-timolol (COSOPT) 22.3-6.8 MG/ML ophthalmic solution 1 drop  1 drop Both Eyes BID Lance Coon, MD   1 drop at 10/17/15 0912  . doxazosin (CARDURA) tablet 8 mg  8 mg Oral Daily Lance Coon, MD   8 mg at 10/17/15 0911  . enoxaparin (LOVENOX) injection 40 mg  40 mg Subcutaneous Q24H Lance Coon, MD   40 mg at 10/16/15 2036  . ibuprofen (ADVIL,MOTRIN) tablet 400 mg  400 mg Oral Q6H PRN Lance Coon, MD      . ipratropium-albuterol (DUONEB) 0.5-2.5 (3) MG/3ML nebulizer solution 3 mL  3 mL Nebulization Q4H PRN Srikar Sudini, MD      . latanoprost (XALATAN) 0.005 % ophthalmic solution 1 drop  1 drop Both Eyes BID Lance Coon, MD   1 drop at 10/17/15 0912  . LORazepam (ATIVAN) tablet 0.5 mg  0.5 mg Oral Q8H  PRN Hillary Bow, MD   0.5 mg at 10/17/15 1356  . methylPREDNISolone sodium succinate (SOLU-MEDROL) 125 mg/2 mL injection 60 mg  60 mg Intravenous Q12H Srikar Sudini, MD   60 mg at 10/17/15 1225  . ondansetron (ZOFRAN) tablet 4 mg  4 mg Oral Q6H PRN Lance Coon, MD       Or  . ondansetron [facility]) injection 4 mg  4 mg Intravenous Q6H PRN Lance Coon, MD      . oseltamivir (TAMIFLU) capsule 30 mg  30 mg Oral BID Harrie Foreman, MD   30 mg at 10/17/15 0912  . pantoprazole (PROTONIX) EC tablet 40 mg  40 mg Oral Daily Lance Coon, MD   40 mg at 10/17/15 0912  . piperacillin-tazobactam (ZOSYN) IVPB 3.375 g  3.375 g Intravenous 3 times per day Lance Coon, MD   3.375 g at 10/17/15 1356  . sodium chloride flush (NS) 0.9 % injection 3 mL  3 mL Intravenous Q12H Lance Coon, MD   3 mL at 10/17/15 0915     Discharge Medications: Please see discharge summary for a list of discharge medications.  Relevant Imaging Results:  Relevant Lab Results:   Additional Information SS: [id number]  Shela Leff, LCSW

## 2015-10-17 NOTE — Progress Notes (Signed)
Pt wobbly/unsteady with ambulation. Generalized weakness. Order requested for PT consult prior to discharge as pt lives in independent facility/home.

## 2015-10-17 NOTE — Progress Notes (Signed)
Pt constantly tries to get up unassisted, increasing confusion noted. Pt was aware he was in hospital this AM, however this evening [facility]". Pt  Is on droplet precautions for flu, so unable to move him  outside room. Paged MD. Order for Haldol 1 mg PO, repeat in 1 hour if needed for agitation/restlessness.

## 2015-10-17 NOTE — Progress Notes (Signed)
[place] at [place] NAME: Alexander Goodwin    MR#:  [id number]  DATE OF BIRTH:  1921-06-21  SUBJECTIVE:   Still has SOB, cough.  REVIEW OF SYSTEMS:    Review of Systems  Constitutional: Positive for fever and malaise/fatigue. Negative for chills.  HENT: Negative for sore throat.   Eyes: Negative for blurred vision.  Respiratory: Positive for cough. Negative for hemoptysis, shortness of breath and wheezing.   Cardiovascular: Negative for chest pain, palpitations and leg swelling.  Gastrointestinal: Negative for nausea, vomiting, abdominal pain, diarrhea and blood in stool.  Genitourinary: Negative for dysuria.  Musculoskeletal: Negative for back pain.  Neurological: Positive for weakness. Negative for dizziness, tremors and headaches.  Endo/Heme/Allergies: Does not bruise/bleed easily.    DRUG ALLERGIES:   Allergies  Allergen Reactions  . Clindamycin/Lincomycin     itching  . Erythromycin Base Rash    All mycins    VITALS:  Blood pressure 128/52, pulse 70, temperature 99.1 F (37.3 C), temperature source Oral, resp. rate 18, height 5\' 9"  (1.753 m), weight 71.033 kg (156 lb 9.6 oz), SpO2 93 %.  PHYSICAL EXAMINATION:   Physical Exam  Constitutional: He is oriented to person, place, and time and well-developed, well-nourished, and in no distress. No distress.  HENT:  Head: Normocephalic.  Eyes: No scleral icterus.  Neck: Normal range of motion. Neck supple. No JVD present. No tracheal deviation present.  Cardiovascular: Normal rate, regular rhythm and normal heart sounds.  Exam reveals no gallop and no friction rub.   No murmur heard. Pulmonary/Chest: Effort normal. No respiratory distress. He has wheezes. He has rales. He exhibits no tenderness.  Abdominal: Soft. Bowel sounds are normal. He exhibits no distension and no mass. There is no tenderness. There is no rebound and no guarding.  Musculoskeletal: Normal range of  motion. He exhibits no edema.  Neurological: He is alert and oriented to person, place, and time.  Skin: Skin is warm. No rash noted. No erythema.  Psychiatric: Affect and judgment normal.   LABORATORY PANEL:   CBC  Recent Labs Lab 10/17/15 0342  WBC 18.3*  HGB 8.4*  HCT 25.8*  PLT 151   ------------------------------------------------------------------------------------------------------------------  Chemistries   Recent Labs Lab 10/16/15 0016  10/17/15 0342  NA 138  < > 135  K 4.3  < > 3.6  CL 105  < > 106  CO2 23  < > 24  GLUCOSE 145*  < > 117*  BUN 27*  < > 18  CREATININE 1.41*  < > 1.41*  CALCIUM 9.2  < > 8.2*  AST 39  --   --   ALT 44  --   --   ALKPHOS 84  --   --   BILITOT 0.5  --   --   < > = values in this interval not displayed. ------------------------------------------------------------------------------------------------------------------  Cardiac Enzymes No results for input(s): TROPONINI in the last 168 hours. ------------------------------------------------------------------------------------------------------------------  RADIOLOGY:  Dg Chest 2 View  10/16/2015  CLINICAL DATA:  Acute onset of confusion and generalized weakness. Fever and cough. Initial encounter. EXAM: CHEST  2 VIEW COMPARISON:  Chest radiograph from 09/30/2014 FINDINGS: The lungs are well-aerated. Right upper and midlung zone airspace opacity raises concern for pneumonia. There is no evidence of pleural effusion or pneumothorax. The heart is borderline normal in size. No acute osseous abnormalities are seen. IMPRESSION: Right upper and midlung zone airspace opacity raises concern for recurrent pneumonia. Followup PA and  lateral chest X-ray is recommended in 3-4 weeks following trial of antibiotic therapy to ensure resolution and exclude underlying malignancy. Electronically Signed   By: Garald Balding M.D.   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  * Right upper and middle lobe pneumonia with acute resp failure with hypoxia and sepsis - POA Worsening WBC. Await cx results. Unstable still. Continues to be febrile. On Tamiflu and Zosyn. Wean O2 as tolerated. Nebs PRN and daily solumedrol added today He will need repeat chest x-ray in 4 weeks.  * Essential hypertension Well controlled  * Vascular dementia: Continue Aricept.  * BPH: Continue Cardura.  * Glaucoma: Continue eyedrops.  * Acute on chronic anemia: Hemoglobin is 8.4  * DVT prophylaxis with Lovenox   CODE STATUS: DNR  TOTAL TIME TAKING CARE OF THIS PATIENT: 30 minutes.   POSSIBLE D/C 2-3 days, DEPENDING ON CLINICAL CONDITION.   Hillary Bow R M.D on 10/17/2015 at 11:23 AM  Between 7am to 6pm - Pager - [phone] After 6pm go to [email or web address] - password [email or web address] [facility]  Office  [phone]  CC: Primary care physician; Thersa Salt, DO  Note: This dictation was prepared with Dragon dictation along with smaller phrase technology. Any transcriptional errors that result from this process are unintentional.

## 2015-10-18 LAB — CULTURE, RESPIRATORY: CULTURE: NORMAL

## 2015-10-18 LAB — CBC WITH DIFFERENTIAL/PLATELET
BASOS PCT: 0 %
Basophils Absolute: 0 10*3/uL (ref 0–0.1)
Eosinophils Absolute: 0 10*3/uL (ref 0–0.7)
Eosinophils Relative: 0 %
HEMATOCRIT: 28.6 % — AB (ref 40.0–52.0)
HEMOGLOBIN: 9.5 g/dL — AB (ref 13.0–18.0)
LYMPHS ABS: 0.6 10*3/uL — AB (ref 1.0–3.6)
Lymphocytes Relative: 7 %
MCH: 32.6 pg (ref 26.0–34.0)
MCHC: 33.3 g/dL (ref 32.0–36.0)
MCV: 97.7 fL (ref 80.0–100.0)
MONO ABS: 0.1 10*3/uL — AB (ref 0.2–1.0)
MONOS PCT: 2 %
NEUTROS ABS: 8.4 10*3/uL — AB (ref 1.4–6.5)
NEUTROS PCT: 91 %
Platelets: 158 10*3/uL (ref 150–440)
RBC: 2.93 MIL/uL — ABNORMAL LOW (ref 4.40–5.90)
RDW: 16.5 % — AB (ref 11.5–14.5)
WBC: 9.2 10*3/uL (ref 3.8–10.6)

## 2015-10-18 LAB — CULTURE, RESPIRATORY W GRAM STAIN

## 2015-10-18 MED ORDER — LEVOFLOXACIN IN D5W 750 MG/150ML IV SOLN
750.0000 mg | INTRAVENOUS | Status: DC
  Administered 2015-10-18: 750 mg via INTRAVENOUS
  Filled 2015-10-18: qty 150

## 2015-10-18 MED ORDER — PREDNISONE 50 MG PO TABS
50.0000 mg | ORAL_TABLET | Freq: Every day | ORAL | Status: DC
  Administered 2015-10-19: 50 mg via ORAL
  Filled 2015-10-18: qty 1

## 2015-10-18 MED ORDER — LEVOFLOXACIN IN D5W 250 MG/50ML IV SOLN
250.0000 mg | INTRAVENOUS | Status: DC
  Filled 2015-10-18: qty 50

## 2015-10-18 NOTE — Progress Notes (Signed)
[facility] at [place] NAME: Alexander Goodwin    MR#:  [id number]  DATE OF BIRTH:  10-16-1920  SUBJECTIVE:   Feels better. Had sundowning and extremely restless overnight with a sitter at bedside. Afebrile now Still on 3 L oxygen  REVIEW OF SYSTEMS:    Review of Systems  Constitutional: Positive for fever and malaise/fatigue. Negative for chills.  HENT: Negative for sore throat.   Eyes: Negative for blurred vision.  Respiratory: Positive for cough. Negative for hemoptysis, shortness of breath and wheezing.   Cardiovascular: Negative for chest pain, palpitations and leg swelling.  Gastrointestinal: Negative for nausea, vomiting, abdominal pain, diarrhea and blood in stool.  Genitourinary: Negative for dysuria.  Musculoskeletal: Negative for back pain.  Neurological: Positive for weakness. Negative for dizziness, tremors and headaches.  Endo/Heme/Allergies: Does not bruise/bleed easily.    DRUG ALLERGIES:   Allergies  Allergen Reactions  . Clindamycin/Lincomycin     itching  . Erythromycin Base Rash    All mycins    VITALS:  Blood pressure 132/64, pulse 58, temperature 96.8 F (36 C), temperature source Oral, resp. rate 20, height 5\' 9"  (1.753 m), weight 71.033 kg (156 lb 9.6 oz), SpO2 98 %.  PHYSICAL EXAMINATION:   Physical Exam  Constitutional: He is oriented to person, place, and time and well-developed, well-nourished, and in no distress. No distress.  HENT:  Head: Normocephalic.  Eyes: No scleral icterus.  Neck: Normal range of motion. Neck supple. No JVD present. No tracheal deviation present.  Cardiovascular: Normal rate, regular rhythm and normal heart sounds.  Exam reveals no gallop and no friction rub.   No murmur heard. Pulmonary/Chest: Effort normal. No respiratory distress. He has wheezes. He has rales. He exhibits no tenderness.  Abdominal: Soft. Bowel sounds are normal. He exhibits no distension and no mass.  There is no tenderness. There is no rebound and no guarding.  Musculoskeletal: Normal range of motion. He exhibits no edema.  Neurological: He is alert and oriented to person, place, and time.  Skin: Skin is warm. No rash noted. No erythema.  Psychiatric: Affect and judgment normal.   LABORATORY PANEL:   CBC  Recent Labs Lab 10/18/15 0455  WBC 9.2  HGB 9.5*  HCT 28.6*  PLT 158   ------------------------------------------------------------------------------------------------------------------  Chemistries   Recent Labs Lab 10/16/15 0016  10/17/15 0342  NA 138  < > 135  K 4.3  < > 3.6  CL 105  < > 106  CO2 23  < > 24  GLUCOSE 145*  < > 117*  BUN 27*  < > 18  CREATININE 1.41*  < > 1.41*  CALCIUM 9.2  < > 8.2*  AST 39  --   --   ALT 44  --   --   ALKPHOS 84  --   --   BILITOT 0.5  --   --   < > = values in this interval not displayed. ------------------------------------------------------------------------------------------------------------------  Cardiac Enzymes No results for input(s): TROPONINI in the last 168 hours. ------------------------------------------------------------------------------------------------------------------  RADIOLOGY:  No results found.   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  * Right upper and middle lobe pneumonia with acute resp failure with hypoxia and sepsis - POA WBC improving. On Tamiflu and IV Zosyn. Wean O2 as tolerated. On  Solu-Medrol and nebulizers. We will change Solu-Medrol to prednisone.  * Essential hypertension Well controlled  * Vascular dementia: Continue Aricept. Has inpatient delirium.  * BPH: Continue Cardura.  * Glaucoma: Continue eyedrops.  * Acute on chronic anemia: Hemoglobin is 8.4  * DVT prophylaxis with  Lovenox   CODE STATUS: DNR  TOTAL TIME TAKING CARE OF THIS PATIENT: 30 minutes.   POSSIBLE D/C 2-3 days, DEPENDING ON CLINICAL CONDITION.   Hillary Bow R M.D on 10/18/2015 at 1:01 PM  Between 7am to 6pm - Pager - [phone] After 6pm go to [email or web address] - password [email or web address] [facility]  Office  [phone]  CC: Primary care physician; Thersa Salt, DO  Note: This dictation was prepared with Dragon dictation along with smaller phrase technology. Any transcriptional errors that result from this process are unintentional.

## 2015-10-18 NOTE — Care Management Important Message (Signed)
Important Message  Patient Details  Name: Alexander Goodwin MRN: [id number] Date of Birth: June 07, 1921   Medicare Important Message Given:  Yes    Marshell Garfinkel, RN 10/18/2015, 9:23 AM

## 2015-10-18 NOTE — Progress Notes (Signed)
Pt more calm this shift, sitter at bedside, continues to reorient pt. He is calm, cooperative. Continues with IV abx, afebrile. Cough present, breath sounds improved but diminished. Received report that pt is to d/c tomorrow with wife to [facility], who will continue with home health care. Pt continues with O2 this shift, unable to wean off this shift.

## 2015-10-18 NOTE — Progress Notes (Signed)
Per [employer] [place] RN patient will need regular home health orders and RN Case Manager can provide home health choice. RN Case Manager is aware of above.   Blima Rich, LCSW [phone]

## 2015-10-18 NOTE — Care Management (Addendum)
Need HHPT order for this patient. O2 is new- assessment needed. Unsure at this time about DME in the home setting. RNCM will continue to follow.

## 2015-10-18 NOTE — Clinical Social Work Note (Signed)
Clinical Social Work Assessment  Patient Details  Name: Alexander Goodwin MRN: [id number] Date of Birth: February 28, 1921  Date of referral:  10/18/15               Reason for consult:  Facility Placement, Other (Comment Required) ([place] )                Permission sought to share information with:  Chartered certified accountant granted to share information::  Yes, Verbal Permission Granted  Name::      Alexander Goodwin  Agency::   [facility] RN   Relationship::     Contact Information:     Housing/Transportation Living arrangements for the past 2 months:  [place] of Information:  Adult Children Patient Interpreter Needed:  None Criminal Activity/Legal Involvement Pertinent to Current Situation/Hospitalization:  No - Comment as needed Significant Relationships:  Adult Children, Spouse Lives with:  Spouse Do you feel safe going back to the place where you live?  Yes Need for family participation in patient care:  Yes (Comment)  Care giving concerns:  Patient lives in the independent living section at [place] with his wife Alexander Goodwin.    Social Worker assessment / plan:  Patient and his wife Alexander Goodwin are both admitted to [facility]. Patient is in room 159 and wife is in room 244. Patient is positive for the flu and is not oriented per chart. PT is recommending home health. CSW contacted patient's son Alexander Goodwin to discuss D/C plan. Son confirmed that patient and his wife Alexander Goodwin live together in an apartment in the independent living section at [place]. Per son patient and wife need to be together. Son reported that patient and wife do better when they are together. Son reported that he prefers for patient and wife to return home with home health.   CSW contacted [facility] living nurse and made her aware of above. Per Cecille Rubin patient and wife can return with home health. RN Case Manager is aware of above. CSW will  continue to follow and assist as needed.   Employment status:  Retired Forensic scientist:  Medicare PT Recommendations:  Home with [place] / Referral to community resources:  Other (Comment Required) (Home Health )  Patient/Family's Response to care:  Patient's son Alexander Goodwin prefers for patient to return home with home health.   Patient/Family's Understanding of and Emotional Response to Diagnosis, Current Treatment, and Prognosis:  Son was pleasant and thanked CSW for calling.   Emotional Assessment Appearance:  Appears stated age Attitude/Demeanor/Rapport:  Unable to Assess Affect (typically observed):  Unable to Assess Orientation:  Oriented to Self, Fluctuating Orientation (Suspected and/or reported Sundowners), Oriented to Place Alcohol / Substance use:  Not Applicable Psych involvement (Current and /or in the community):  No (Comment)  Discharge Needs  Concerns to be addressed:  Discharge Planning Concerns Readmission within the last 30 days:  No Current discharge risk:  Chronically ill Barriers to Discharge:  Continued Medical Work up   Loralyn Freshwater, LCSW 10/18/2015, 2:59 PM

## 2015-10-19 MED ORDER — LEVOFLOXACIN 750 MG PO TABS: 750.0000 mg | ORAL_TABLET | ORAL | Status: DC

## 2015-10-19 MED ORDER — PREDNISONE 10 MG (21) PO TBPK: ORAL_TABLET | ORAL | Status: DC

## 2015-10-19 NOTE — Care Management (Signed)
Message left with son  LEBRANDON, HUSKA  [phone]  To call RNCM to discuss discharge planning.

## 2015-10-19 NOTE — Progress Notes (Signed)
IV to PO Transition for Antibiotic Therapy per Protocol  Patient is a 82 yo male admitted with CAP and currently order Levaquin 750 mg IV q48h.  Based on below criteria, patient will be transitioned to oral Levaquin per IV to PO policy.    1.  Patient has White Blood Count < 11.1 x 103/mm3    2.  Patient has maintained temperature < 99.5 Fahrenheit for twenty-four hours 3.  Patient has eaten > 50% of meals for twenty-four hours 4.  Patient is taking other medications by mouth   Murrell Converse, PharmD Clinical Pharmacist 10/19/2015

## 2015-10-19 NOTE — Care Management (Signed)
Attempted to contact patient's DIL Ms. Cutler at cell number listed- message left. Home health arranged through [facility]. No further RNCM needs. Case closed.

## 2015-10-20 ENCOUNTER — Telehealth: Payer: Self-pay

## 2015-10-20 NOTE — Telephone Encounter (Signed)
Transition Care Management Follow-up Telephone Call   Date discharged? 10/19/15   How have you been since you were released from the hospital? Very weak. No pain, fever, cough, headache, diarrhea, urinary frequency or dizziness. No home oxygen.  Appetite is fair.   Do you understand why you were in the hospital? Yes, the flu and trouble breathing.   Do you understand the discharge instructions? Yes, I am trying to use my walker more often and move slowly between activities.   Where were you discharged to? Home   Items Reviewed:  Medications reviewed: Yes, completed course of Tamiflu and now taking Levaquin by mouth with other scheduled medications without issues.  Allergies reviewed: Yes, no changes.  Dietary changes reviewed: Yes, regular diet, no problems.  Referrals reviewed: Yes, all recommended appointments scheduled.   Functional Questionnaire:   Activities of Daily Living (ADLs):   He states they are independent in the following: Self feeding, grooming, toileting. States they require assistance with the following: Ambulating (uses walker), meal prep, bathing, dressing.  Home health to assist.   Any transportation issues/concerns?: No   Any patient concerns? None at this time.   Confirmed importance and date/time of follow-up visits scheduled Yes, appointment scheduled 10/24/15.  Provider Appointment booked with Dr. Derrel Nip (PCP on scheduled leave).  Confirmed with patient if condition begins to worsen call PCP or go to the ER.  Patient was given the office number and encouraged to call back with question or concerns.  : Yes, verbalized understanding.

## 2015-10-21 LAB — CULTURE, BLOOD (ROUTINE X 2)
CULTURE: NO GROWTH
Culture: NO GROWTH

## 2015-10-22 NOTE — Discharge Summary (Signed)
[name] NAME: Alexander Goodwin    MR#:  [id number]  DATE OF BIRTH:  November 20, 1920  DATE OF ADMISSION:  10/16/2015 ADMITTING PHYSICIAN: Lance Coon, MD  DATE OF DISCHARGE: 10/19/2015  2:25 PM  PRIMARY CARE PHYSICIAN: Thersa Salt, DO    ADMISSION DIAGNOSIS:  Hypoxia [R09.02] Influenza [J11.1]  DISCHARGE DIAGNOSIS:  Principal Problem:   Sepsis ([place]) Active Problems:   GERD (gastroesophageal reflux disease)   BPH (benign prostatic hyperplasia)   Hyperlipemia   Hypertension   Dementia, vascular   Influenza A   HCAP (healthcare-associated pneumonia)   AKI (acute kidney injury) ([place])   SECONDARY DIAGNOSIS:   Past Medical History  Diagnosis Date  . Diverticulitis of colon   . History of colon polyps   . Arthritis     right knee, hips, neck. He denies any major limitation in activities  . GERD (gastroesophageal reflux disease)     well controlled with medication. No nocturnal symptoms on a regular basis  . Glaucoma     both eyes, followed closely by Dr. Janyth Contes  . Esophagitis     Last EGD Fall '11   . Abscess of lung without pneumonia ([place])     Right upper lobe chronic lung abscess and prior left upper lobe lung abscess due to chronic aspiration  . Pancreatitis     resolved after GB surgery  . Stroke [facility])     TIAs - age 72  . BPH (benign prostatic hypertrophy) with urinary obstruction   . Hyperlipemia   . Hypertension   . Seasonal allergies   . Dysphagia, oropharyngeal   . Dementia     HOSPITAL COURSE:   * Right upper and middle lobe pneumonia with acute resp failure with hypoxia and sepsis - POA WBC improving. On Tamiflu and IV Zosyn. Wean O2 as tolerated. On Solu-Medrol and nebulizers. We will change Solu-Medrol to prednisone taper Given levaquin on discharge.  * Essential hypertension Well controlled  * Vascular dementia: Continue Aricept. Has inpatient delirium.  * BPH: Continue Cardura.  *  Glaucoma: Continue eyedrops.  * Acute on chronic anemia: Hemoglobin is 8.4  * DVT prophylaxis with Lovenox   DISCHARGE CONDITIONS:   Stable.  CONSULTS OBTAINED:     DRUG ALLERGIES:   Allergies  Allergen Reactions  . Clindamycin/Lincomycin     itching  . Erythromycin Base Rash    All mycins    DISCHARGE MEDICATIONS:   Discharge Medication List as of 10/19/2015  2:15 PM    START taking these medications   Details  levofloxacin (LEVAQUIN) 750 MG tablet Take 1 tablet (750 mg total) by mouth every other day., Starting 10/20/2015, Until Discontinued, Print    predniSONE (STERAPRED UNI-PAK 21 TAB) 10 MG (21) TBPK tablet Take 6 tabs first day, 5 tab on day 2, then 4 on day 3rd, 3 tabs on day 4th , 2 tab on day 5th, and 1 tab on 6th day., Print      CONTINUE these medications which have NOT CHANGED   Details  acetaminophen (TYLENOL ARTHRITIS PAIN) 650 MG CR tablet Take 650 mg by mouth every 8 (eight) hours as needed.  , Until Discontinued, Historical Med    amLODipine (NORVASC) 10 MG tablet Take 10 mg by mouth daily.  , Until Discontinued, Historical Med    AZOPT 1 % ophthalmic suspension Place 1 drop into both eyes 2 (two) times daily., Until Discontinued, Historical Med    beta carotene  w/minerals (OCUVITE) tablet Take 1 tablet by mouth daily., Until Discontinued, Historical Med    brimonidine (ALPHAGAN P) 0.1 % SOLN Place 1 drop into both eyes 3 (three) times daily. , Until Discontinued, Historical Med    calcium carbonate (TUMS - DOSED IN MG ELEMENTAL CALCIUM) 500 MG chewable tablet Chew 1 tablet by mouth as needed for indigestion or heartburn., Until Discontinued, Historical Med    calcium-vitamin D (OSCAL 500/200 D-3) 500-200 MG-UNIT per tablet Take 1 tablet by mouth daily. , Until Discontinued, Historical Med    !! donepezil (ARICEPT) 10 MG tablet Take 10 mg by mouth at bedtime., Until Discontinued, Historical Med    dorzolamide-timolol (COSOPT) 22.3-6.8 MG/ML  ophthalmic solution Place 1 drop into both eyes 2 (two) times daily., Until Discontinued, Historical Med    doxazosin (CARDURA) 8 MG tablet Take 8 mg by mouth daily., Until Discontinued, Historical Med    gabapentin (NEURONTIN) 300 MG capsule Take 300 mg by mouth 2 (two) times daily as needed (for shingles)., Until Discontinued, Historical Med    Iron, Ferrous Gluconate, 256 (28 FE) MG TABS Take 1 tablet by mouth daily., Until Discontinued, Historical Med    latanoprost (XALATAN) 0.005 % ophthalmic solution Place 1 drop into both eyes 2 (two) times daily. , Until Discontinued, Historical Med    omeprazole (PRILOSEC) 20 MG capsule Take 20 mg by mouth daily., Until Discontinued, Historical Med    sucralfate (CARAFATE) 1 G tablet Take 1 tablet by mouth 2 (two) times daily before a meal., Starting 01/17/2015, Until Discontinued, Historical Med    !! donepezil (ARICEPT) 5 MG tablet TAKE ONE TABLET AT BEDTIME MAY INCREASE TO TAKE TWO TABLETS AT BEDTIME AFTER -FOUR TO SIX WEEKS, Normal     !! - Potential duplicate medications found. Please discuss with provider.       DISCHARGE INSTRUCTIONS:    follwow ith PMD in 1-2 weeks.  If you experience worsening of your admission symptoms, develop shortness of breath, life threatening emergency, suicidal or homicidal thoughts you must seek medical attention immediately by calling 911 or calling your MD immediately  if symptoms less severe.  You Must read complete instructions/literature along with all the possible adverse reactions/side effects for all the Medicines you take and that have been prescribed to you. Take any new Medicines after you have completely understood and accept all the possible adverse reactions/side effects.   Please note  You were cared for by a hospitalist during your hospital stay. If you have any questions about your discharge medications or the care you received while you were in the hospital after you are discharged, you can  call the unit and asked to speak with the hospitalist on call if the hospitalist that took care of you is not available. Once you are discharged, your primary care physician will handle any further medical issues. Please note that NO REFILLS for any discharge medications will be authorized once you are discharged, as it is imperative that you return to your primary care physician (or establish a relationship with a primary care physician if you do not have one) for your aftercare needs so that they can reassess your need for medications and monitor your lab values.    Today   CHIEF COMPLAINT:   Chief Complaint  Patient presents with  . Code Sepsis    HISTORY OF PRESENT ILLNESS:  Alexander Goodwin  is a 82 y.o. male presents with fever, chills, cough. Patient contributes some to his history, though more collateral  information is taken from his son who is present at bedside in the ED. Patient presents from nursing home after having an acute episode of chills with some confusion earlier today. He also started with a cough today. Here in the ED he was found to have an initial temperature 104. Further workup revealed influenza A positive, chest x-ray with right upper mid lung opacity likely pneumonia, and CBC with normal white count but significant bandemia. Patient was tachycardic and tachypneic and arrived here. Hospitalists were called for sepsis.   VITAL SIGNS:  Blood pressure 144/66, pulse 54, temperature 95.7 F (35.4 C), temperature source Oral, resp. rate 18, height 5\' 9"  (1.753 m), weight 71.033 kg (156 lb 9.6 oz), SpO2 95 %.  I/O:  No intake or output data in the 24 hours ending 10/22/15 1528  PHYSICAL EXAMINATION:   Constitutional: He is oriented to person, place, and time and well-developed, well-nourished, and in no distress. No distress.  HENT:  Head: Normocephalic.  Eyes: No scleral icterus.  Neck: Normal range of motion. Neck supple. No JVD present. No tracheal deviation present.   Cardiovascular: Normal rate, regular rhythm and normal heart sounds. Exam reveals no gallop and no friction rub.  No murmur heard. Pulmonary/Chest: Effort normal. No respiratory distress. He has wheezes. He has rales. He exhibits no tenderness.  Abdominal: Soft. Bowel sounds are normal. He exhibits no distension and no mass. There is no tenderness. There is no rebound and no guarding.  Musculoskeletal: Normal range of motion. He exhibits no edema.  Neurological: He is alert and oriented to person, place, and time.  Skin: Skin is warm. No rash noted. No erythema.  Psychiatric: Affect and judgment normal.   DATA REVIEW:   CBC  Recent Labs Lab 10/18/15 0455  WBC 9.2  HGB 9.5*  HCT 28.6*  PLT 158    Chemistries   Recent Labs Lab 10/16/15 0016  10/17/15 0342  NA 138  < > 135  K 4.3  < > 3.6  CL 105  < > 106  CO2 23  < > 24  GLUCOSE 145*  < > 117*  BUN 27*  < > 18  CREATININE 1.41*  < > 1.41*  CALCIUM 9.2  < > 8.2*  AST 39  --   --   ALT 44  --   --   ALKPHOS 84  --   --   BILITOT 0.5  --   --   < > = values in this interval not displayed.  Cardiac Enzymes No results for input(s): TROPONINI in the last 168 hours.  Microbiology Results  Results for orders placed or performed during the hospital encounter of 10/16/15  Urine culture     Status: None   Collection Time: 10/16/15 12:15 AM  Result Value Ref Range Status   Specimen Description URINE, RANDOM  Final   Special Requests NONE  Final   Culture MULTIPLE SPECIES PRESENT, SUGGEST RECOLLECTION  Final   Report Status 10/17/2015 FINAL  Final  Culture, blood (routine x 2)     Status: None   Collection Time: 10/16/15 12:16 AM  Result Value Ref Range Status   Specimen Description BLOOD BLOOD RIGHT FOREARM  Final   Special Requests BOTTLES DRAWN AEROBIC AND ANAEROBIC 6CC  Final   Culture NO GROWTH 5 DAYS  Final   Report Status 10/21/2015 FINAL  Final  Culture, blood (routine x 2)     Status: None   Collection  Time: 10/16/15 12:18 AM  Result Value Ref Range Status   Specimen Description BLOOD BLOOD LEFT FOREARM  Final   Special Requests NONE  Final   Culture NO GROWTH 5 DAYS  Final   Report Status 10/21/2015 FINAL  Final  Rapid Influenza A&B Antigens ([facility] only)     Status: None   Collection Time: 10/16/15 12:25 AM  Result Value Ref Range Status   Influenza A ([place]) DETECTED  Final   Influenza B ([facility]) NOT DETECTED  Final  MRSA PCR Screening     Status: None   Collection Time: 10/16/15  3:34 AM  Result Value Ref Range Status   MRSA by PCR NEGATIVE NEGATIVE Final    Comment:        The GeneXpert MRSA Assay (FDA approved for NASAL specimens only), is one component of a comprehensive MRSA colonization surveillance program. It is not intended to diagnose MRSA infection nor to guide or monitor treatment for MRSA infections.   Culture, sputum-assessment     Status: None   Collection Time: 10/16/15  3:59 PM  Result Value Ref Range Status   Specimen Description SPUTUM  Final   Special Requests NONE  Final   Sputum evaluation THIS SPECIMEN IS ACCEPTABLE FOR SPUTUM CULTURE  Final   Report Status 10/16/2015 FINAL  Final  Culture, respiratory (NON-Expectorated)     Status: None   Collection Time: 10/16/15  3:59 PM  Result Value Ref Range Status   Specimen Description SPUTUM  Final   Special Requests SPUTUM  Final   Gram Stain   Final    MODERATE WBC SEEN MODERATE GRAM POSITIVE RODS RARE GRAM POSITIVE COCCI IN PAIRS GOOD SPECIMEN - 80-90% WBCS    Culture Consistent with normal respiratory flora.  Final   Report Status 10/18/2015 FINAL  Final    RADIOLOGY:  No results found.   Management plans discussed with the patient, family and they are in agreement.  CODE STATUS:  Code Status History    Date Active Date Inactive Code Status Order ID Comments User Context   10/16/2015  2:44 AM 10/19/2015  6:09 PM DNR [id number]  Lance Coon, MD ED   02/06/2015 10:41 PM 02/07/2015  6:08 PM DNR  [id number]  Lytle Butte, MD ED    Questions for Most Recent Historical Code Status (Order [id number])    Question Answer Comment   In the event of cardiac or respiratory ARREST Do not call a "code blue"    In the event of cardiac or respiratory ARREST Do not perform Intubation, CPR, defibrillation or ACLS    In the event of cardiac or respiratory ARREST Use medication by any route, position, wound care, and other measures to relive pain and suffering. May use oxygen, suction and manual treatment of airway obstruction as needed for comfort.     Advance Directive Documentation        Most Recent Value   Type of Advance Directive  Living will, Out of facility DNR (pink MOST or yellow form), Healthcare Power of Attorney   Pre-existing out of facility DNR order (yellow form or pink MOST form)  Yellow form placed in chart (order not valid for inpatient use)   "MOST" Form in Place?        TOTAL TIME TAKING CARE OF THIS PATIENT: 35 minutes.    Vaughan Basta M.D on 10/22/2015 at 3:28 PM  Between 7am to 6pm - Pager - [phone]  After 6pm go to [email or web address] - password [email or web address] [facility]  Office  [phone]  CC: Primary care physician; Thersa Salt, DO   Note: This dictation was prepared with Dragon dictation along with smaller phrase technology. Any transcriptional errors that result from this process are unintentional.

## 2015-10-24 ENCOUNTER — Ambulatory Visit (INDEPENDENT_AMBULATORY_CARE_PROVIDER_SITE_OTHER): Payer: Medicare Other | Admitting: Internal Medicine

## 2015-10-24 ENCOUNTER — Encounter: Payer: Self-pay | Admitting: Internal Medicine

## 2015-10-24 VITALS — BP 106/50 | HR 69 | Temp 97.6°F | Resp 12 | Ht 69.0 in | Wt 163.8 lb

## 2015-10-24 DIAGNOSIS — J101 Influenza due to other identified influenza virus with other respiratory manifestations: Secondary | ICD-10-CM

## 2015-10-24 DIAGNOSIS — Z5189 Encounter for other specified aftercare: Secondary | ICD-10-CM

## 2015-10-24 DIAGNOSIS — Z09 Encounter for follow-up examination after completed treatment for conditions other than malignant neoplasm: Secondary | ICD-10-CM

## 2015-10-24 DIAGNOSIS — J1100 Influenza due to unidentified influenza virus with unspecified type of pneumonia: Secondary | ICD-10-CM

## 2015-10-24 DIAGNOSIS — J09X1 Influenza due to identified novel influenza A virus with pneumonia: Secondary | ICD-10-CM

## 2015-10-24 DIAGNOSIS — J189 Pneumonia, unspecified organism: Secondary | ICD-10-CM

## 2015-10-24 DIAGNOSIS — N179 Acute kidney failure, unspecified: Secondary | ICD-10-CM

## 2015-10-24 MED ORDER — CULTURELLE DIGESTIVE HEALTH PO CAPS: 1.0000 | ORAL_CAPSULE | Freq: Every day | ORAL | Status: DC

## 2015-10-24 NOTE — Patient Instructions (Addendum)
  I'm glad you are feeling better!!  We will need to repeat yoru chest x ray after April 15 at [facility] to make sure the pneumonia has cleared form your x ray   I want you to take a probiotic (culturelle) every day for 30 days to keep your bowels  from getting harmed by the antibiotics.  The rx has been sent to [facility] have lost 5 lbs since your last visit with Dr Lacinda Axon.     Make sure you keep cheese, peanut butter,  Apples and oranges and greek yogurt in your house to snack on  Keep Ensure,   Boost,  Or Premier Protein  In the refrigerator for when you are too busy to go for a real meal.

## 2015-10-24 NOTE — Progress Notes (Signed)
Pre-visit discussion using our clinic review tool. No additional management support is needed unless otherwise documented below in the visit note.  

## 2015-10-24 NOTE — Progress Notes (Signed)
Subjective:  Patient ID: Alexander Goodwin, male    DOB: 1921/01/24  Age: 82 y.o. MRN: [id number]  CC: The primary encounter diagnosis was CAP (community acquired pneumonia). Diagnoses of Influenza with pneumonia, Influenza A, AKI (acute kidney injury) (Alexander Goodwin), and Hospital discharge follow-up were also pertinent to this visit.  HPI Alexander Goodwin presents for hospital follow up.  Presented to ER with severe headache and flu like symptoms on Feb 26 and was admitted to [facility] with SIRS  and  respiratory failure,  secondary to influenza and concurrent  RUL/RML PNA.  He was treated with Tamiflu,  Zosyn,  02,  solumedrol and nebs,.  discharged on Levaquin on March 1 .  Cough has improved and is nonproductive.  Sleeping fine.  No diarrhea. Does not take a priobiotic.   had inpatient delirium while hospitalized. History  of dementia   LIVES AT [place] Goodwin wife  who was also hospitalized for same.   Accompanied by Alexander Goodwin from [place].  He is independent  OF ADLS.   Uss a cane and walker. LAST FALL WAS a few weeks ago,  [place] while walking around a chair at home. . Uses a rolling walker for the dining hall.   sleeping ok,  appetite "good enough"  but has lost 5 lbs since last [place] office visit with his PCP Dr. Lacinda Goodwin   Outpatient Prescriptions Prior to Visit  Medication Sig Dispense Refill  . acetaminophen (TYLENOL ARTHRITIS PAIN) 650 MG CR tablet Take 650 mg by mouth every 8 (eight) hours as needed.      Marland Kitchen amLODipine (NORVASC) 10 MG tablet Take 10 mg by mouth daily.      . AZOPT 1 % ophthalmic suspension Place 1 drop into both eyes 2 (two) times daily.    . beta carotene w/minerals (OCUVITE) tablet Take 1 tablet by mouth daily.    . brimonidine (ALPHAGAN P) 0.1 % SOLN Place 1 drop into both eyes 3 (three) times daily.     . calcium carbonate (TUMS - DOSED IN MG ELEMENTAL CALCIUM) 500 MG chewable tablet Chew 1 tablet by mouth as needed for indigestion or heartburn.    . calcium-vitamin D (OSCAL  500/200 D-3) 500-200 MG-UNIT per tablet Take 1 tablet by mouth daily.     Marland Kitchen donepezil (ARICEPT) 10 MG tablet Take 10 mg by mouth at bedtime.    . donepezil (ARICEPT) 5 MG tablet TAKE ONE TABLET AT BEDTIME MAY INCREASE TO TAKE TWO TABLETS AT BEDTIME AFTER -FOUR TO SIX WEEKS (Patient not taking: Reported on 10/16/2015) 90 tablet 1  . dorzolamide-timolol (COSOPT) 22.3-6.8 MG/ML ophthalmic solution Place 1 drop into both eyes 2 (two) times daily.    Marland Kitchen doxazosin (CARDURA) 8 MG tablet Take 8 mg by mouth daily.    Marland Kitchen gabapentin (NEURONTIN) 300 MG capsule Take 300 mg by mouth 2 (two) times daily as needed (for shingles).    . Iron, Ferrous Gluconate, 256 (28 FE) MG TABS Take 1 tablet by mouth daily.    Marland Kitchen latanoprost (XALATAN) 0.005 % ophthalmic solution Place 1 drop into both eyes 2 (two) times daily.     Marland Kitchen levofloxacin (LEVAQUIN) 750 MG tablet Take 1 tablet (750 mg total) by mouth every other day. 3 tablet 0  . omeprazole (PRILOSEC) 20 MG capsule Take 20 mg by mouth daily.    . predniSONE (STERAPRED UNI-PAK 21 TAB) 10 MG (21) TBPK tablet Take 6 tabs first day, 5 tab on day 2, then 4 on day  3rd, 3 tabs on day 4th , 2 tab on day 5th, and 1 tab on 6th day. 21 tablet 0  . sucralfate (CARAFATE) 1 G tablet Take 1 tablet by mouth 2 (two) times daily before a meal.  0   No facility-administered medications prior to visit.    Review of Systems;  Patient denies headache, fevers, malaise, unintentional weight loss, skin rash, eye pain, sinus congestion and sinus pain, sore throat, dysphagia,  hemoptysis , cough, dyspnea, wheezing, chest pain, palpitations, orthopnea, edema, abdominal pain, nausea, melena, diarrhea, constipation, flank pain, dysuria, hematuria, urinary  Frequency, nocturia, numbness, tingling, seizures,  Focal weakness, Loss of consciousness,  Tremor, insomnia, depression, anxiety, and suicidal ideation.      Objective:  BP 106/50 mmHg  Pulse 69  Temp(Src) 97.6 F (36.4 C) (Oral)  Resp 12  Ht  5\' 9"  (1.753 m)  Wt 163 lb 12 oz (74.277 kg)  BMI 24.17 kg/m2  SpO2 97%  BP Readings from Last 3 Encounters:  10/24/15 106/50  10/19/15 144/66  09/14/15 136/84    Wt Readings from Last 3 Encounters:  10/24/15 163 lb 12 oz (74.277 kg)  10/16/15 156 lb 9.6 oz (71.033 kg)  09/14/15 168 lb (76.204 kg)    General appearance: alert, cooperative and appears stated age Ears: normal TM's and external ear canals both ears Throat: lips, mucosa, and tongue normal; teeth and gums normal Neck: no adenopathy, no carotid bruit, supple, symmetrical, trachea midline and thyroid not enlarged, symmetric, no tenderness/mass/nodules Back: symmetric, no curvature. ROM normal. No CVA tenderness. Lungs: clear to auscultation bilaterally Heart: regular rate and rhythm, S1, S2 normal, no murmur, click, rub or gallop Abdomen: soft, non-tender; bowel sounds normal; no masses,  no organomegaly Pulses: 2+ and symmetric Skin: Skin color, texture, turgor normal. No rashes or lesions Lymph nodes: Cervical, supraclavicular, and axillary nodes normal.  No results found for: HGBA1C  Lab Results  Component Value Date   CREATININE 1.41* 10/17/2015   CREATININE 1.45* 10/16/2015   CREATININE 1.41* 10/16/2015    Lab Results  Component Value Date   WBC 9.2 10/18/2015   HGB 9.5* 10/18/2015   HCT 28.6* 10/18/2015   PLT 158 10/18/2015   GLUCOSE 117* 10/17/2015   CHOL 143 10/07/2013   TRIG 79 10/07/2013   HDL 44 10/07/2013   LDLCALC 83 10/07/2013   ALT 44 10/16/2015   AST 39 10/16/2015   NA 135 10/17/2015   K 3.6 10/17/2015   CL 106 10/17/2015   CREATININE 1.41* 10/17/2015   BUN 18 10/17/2015   CO2 24 10/17/2015   INR 1.06 02/06/2015    Dg Chest 2 View  10/16/2015  CLINICAL DATA:  Acute onset of confusion and generalized weakness. Fever and cough. Initial encounter. EXAM: CHEST  2 VIEW COMPARISON:  Chest radiograph from 09/30/2014 FINDINGS: The lungs are well-aerated. Right upper and midlung zone  airspace opacity raises concern for pneumonia. There is no evidence of pleural effusion or pneumothorax. The heart is borderline normal in size. No acute osseous abnormalities are seen. IMPRESSION: Right upper and midlung zone airspace opacity raises concern for recurrent pneumonia. Followup PA and lateral chest X-ray is recommended in 3-4 weeks following trial of antibiotic therapy to ensure resolution and exclude underlying malignancy. Electronically Signed   By: Garald Balding M.D.   On: 10/16/2015 00:50    Assessment & Plan:   Problem List Items Addressed This Visit    Influenza A    S/p admission for SIRS . completed Tamiflu.  AKI (acute kidney injury) ([place])    Cr  Had a slight bump from 1.11 in June  Will repeat  Lab Results  Component Value Date   CREATININE 1.41* 10/17/2015   CREATININE 1.45* 10/16/2015   CREATININE 1.41* 10/16/2015        Influenza with pneumonia    Improved clinically.  Repeat CXR in 6 weeks. Advised to start a probiotic given recent use of antibiotics.       Hospital discharge follow-up    .Records from recent hospitalization reviewed with patient. Patient is stable post discharge and has no new issues or questions about his discharge plans.         Other Visit Diagnoses    CAP (community acquired pneumonia)    -  Primary    Relevant Orders    DG Chest 2 View       I am having Mr. Calabria start on [place]. I am also having him maintain his acetaminophen, amLODipine, calcium-vitamin D, brimonidine, latanoprost, omeprazole, AZOPT, beta carotene w/minerals, Iron (Ferrous Gluconate), calcium carbonate, sucralfate, gabapentin, dorzolamide-timolol, donepezil, donepezil, doxazosin, levofloxacin, and predniSONE.  Meds ordered this encounter  Medications  . Lactobacillus-Inulin ([place]) CAPS    Sig: Take 1 capsule by mouth daily.    Dispense:  30 capsule    Refill:  0    PHARMACY MAY SUBSTITUTE ANOTHER  AVAILABLE PRIOBIOTIC FOR USE IF NECESSARY    There are no discontinued medications.  Follow-up: No Follow-up on file.   Crecencio Mc, MD

## 2015-10-25 DIAGNOSIS — Z09 Encounter for follow-up examination after completed treatment for conditions other than malignant neoplasm: Secondary | ICD-10-CM | POA: Insufficient documentation

## 2015-10-25 DIAGNOSIS — J1100 Influenza due to unidentified influenza virus with unspecified type of pneumonia: Secondary | ICD-10-CM | POA: Insufficient documentation

## 2015-10-25 NOTE — Assessment & Plan Note (Addendum)
.  Records from recent hospitalization reviewed with patient. Patient is stable post discharge and has no new issues or questions about his discharge plans.

## 2015-10-25 NOTE — Assessment & Plan Note (Signed)
S/p admission for SIRS . completed Tamiflu.

## 2015-10-25 NOTE — Assessment & Plan Note (Signed)
Cr  Had a slight bump from 1.11 in June  Will repeat  Lab Results  Component Value Date   CREATININE 1.41* 10/17/2015   CREATININE 1.45* 10/16/2015   CREATININE 1.41* 10/16/2015

## 2015-10-25 NOTE — Assessment & Plan Note (Addendum)
Improved clinically.  Repeat CXR in 6 weeks. Advised to start a probiotic given recent use of antibiotics.

## 2015-10-26 ENCOUNTER — Inpatient Hospital Stay
Admission: EM | Admit: 2015-10-26 | Discharge: 2015-10-29 | DRG: 195 | Disposition: A | Discharge status: Home Health | Payer: Medicare Other | Attending: Internal Medicine | Admitting: Internal Medicine

## 2015-10-26 ENCOUNTER — Emergency Department: Payer: Medicare Other

## 2015-10-26 ENCOUNTER — Encounter: Payer: Self-pay | Admitting: Emergency Medicine

## 2015-10-26 DIAGNOSIS — K219 Gastro-esophageal reflux disease without esophagitis: Secondary | ICD-10-CM | POA: Diagnosis present

## 2015-10-26 DIAGNOSIS — Z66 Do not resuscitate: Secondary | ICD-10-CM | POA: Diagnosis present

## 2015-10-26 DIAGNOSIS — Y95 Nosocomial condition: Secondary | ICD-10-CM | POA: Diagnosis present

## 2015-10-26 DIAGNOSIS — F0150 Vascular dementia without behavioral disturbance: Secondary | ICD-10-CM | POA: Diagnosis present

## 2015-10-26 DIAGNOSIS — Z833 Family history of diabetes mellitus: Secondary | ICD-10-CM

## 2015-10-26 DIAGNOSIS — H409 Unspecified glaucoma: Secondary | ICD-10-CM | POA: Diagnosis present

## 2015-10-26 DIAGNOSIS — Z79899 Other long term (current) drug therapy: Secondary | ICD-10-CM | POA: Diagnosis not present

## 2015-10-26 DIAGNOSIS — Z8673 Personal history of transient ischemic attack (TIA), and cerebral infarction without residual deficits: Secondary | ICD-10-CM | POA: Diagnosis not present

## 2015-10-26 DIAGNOSIS — K449 Diaphragmatic hernia without obstruction or gangrene: Secondary | ICD-10-CM | POA: Diagnosis not present

## 2015-10-26 DIAGNOSIS — D72829 Elevated white blood cell count, unspecified: Secondary | ICD-10-CM | POA: Diagnosis not present

## 2015-10-26 DIAGNOSIS — E785 Hyperlipidemia, unspecified: Secondary | ICD-10-CM | POA: Diagnosis present

## 2015-10-26 DIAGNOSIS — Z800 Family history of malignant neoplasm of digestive organs: Secondary | ICD-10-CM | POA: Diagnosis not present

## 2015-10-26 DIAGNOSIS — R1011 Right upper quadrant pain: Secondary | ICD-10-CM | POA: Diagnosis not present

## 2015-10-26 DIAGNOSIS — J189 Pneumonia, unspecified organism: Secondary | ICD-10-CM | POA: Diagnosis present

## 2015-10-26 DIAGNOSIS — M1711 Unilateral primary osteoarthritis, right knee: Secondary | ICD-10-CM | POA: Diagnosis present

## 2015-10-26 DIAGNOSIS — K5730 Diverticulosis of large intestine without perforation or abscess without bleeding: Secondary | ICD-10-CM | POA: Diagnosis not present

## 2015-10-26 DIAGNOSIS — Z86010 Personal history of colonic polyps: Secondary | ICD-10-CM

## 2015-10-26 DIAGNOSIS — M6281 Muscle weakness (generalized): Secondary | ICD-10-CM | POA: Diagnosis not present

## 2015-10-26 DIAGNOSIS — Z8261 Family history of arthritis: Secondary | ICD-10-CM

## 2015-10-26 DIAGNOSIS — Z823 Family history of stroke: Secondary | ICD-10-CM | POA: Diagnosis not present

## 2015-10-26 DIAGNOSIS — N400 Enlarged prostate without lower urinary tract symptoms: Secondary | ICD-10-CM | POA: Diagnosis present

## 2015-10-26 DIAGNOSIS — Z87891 Personal history of nicotine dependence: Secondary | ICD-10-CM

## 2015-10-26 DIAGNOSIS — R51 Headache: Secondary | ICD-10-CM | POA: Diagnosis not present

## 2015-10-26 DIAGNOSIS — Z825 Family history of asthma and other chronic lower respiratory diseases: Secondary | ICD-10-CM

## 2015-10-26 DIAGNOSIS — H401133 Primary open-angle glaucoma, bilateral, severe stage: Secondary | ICD-10-CM | POA: Diagnosis not present

## 2015-10-26 DIAGNOSIS — Z792 Long term (current) use of antibiotics: Secondary | ICD-10-CM

## 2015-10-26 DIAGNOSIS — J181 Lobar pneumonia, unspecified organism: Secondary | ICD-10-CM

## 2015-10-26 DIAGNOSIS — R918 Other nonspecific abnormal finding of lung field: Secondary | ICD-10-CM | POA: Diagnosis not present

## 2015-10-26 DIAGNOSIS — I10 Essential (primary) hypertension: Secondary | ICD-10-CM | POA: Diagnosis present

## 2015-10-26 DIAGNOSIS — Z8249 Family history of ischemic heart disease and other diseases of the circulatory system: Secondary | ICD-10-CM

## 2015-10-26 LAB — HEPATIC FUNCTION PANEL
ALT: 105 U/L — ABNORMAL HIGH (ref 17–63)
AST: 37 U/L (ref 15–41)
Albumin: 2.9 g/dL — ABNORMAL LOW (ref 3.5–5.0)
Alkaline Phosphatase: 86 U/L (ref 38–126)
BILIRUBIN DIRECT: 0.2 mg/dL (ref 0.1–0.5)
BILIRUBIN INDIRECT: 1.1 mg/dL — AB (ref 0.3–0.9)
BILIRUBIN TOTAL: 1.3 mg/dL — AB (ref 0.3–1.2)
Total Protein: 5.6 g/dL — ABNORMAL LOW (ref 6.5–8.1)

## 2015-10-26 LAB — DIFFERENTIAL
BASOS ABS: 0 10*3/uL (ref 0–0.1)
Band Neutrophils: 11 %
Basophils Relative: 0 %
Blasts: 0 %
EOS ABS: 0 10*3/uL (ref 0–0.7)
Eosinophils Relative: 0 %
LYMPHS PCT: 10 %
Lymphs Abs: 3.1 10*3/uL (ref 1.0–3.6)
MONO ABS: 2.2 10*3/uL — AB (ref 0.2–1.0)
MONOS PCT: 7 %
Metamyelocytes Relative: 0 %
Myelocytes: 0 %
NEUTROS PCT: 72 %
Neutro Abs: 25.4 10*3/uL — ABNORMAL HIGH (ref 1.4–6.5)
Other: 0 %
PROMYELOCYTES ABS: 0 %
nRBC: 0 /100 WBC

## 2015-10-26 LAB — BASIC METABOLIC PANEL
Anion gap: 7 (ref 5–15)
BUN: 25 mg/dL — AB (ref 6–20)
CHLORIDE: 101 mmol/L (ref 101–111)
CO2: 26 mmol/L (ref 22–32)
CREATININE: 1.42 mg/dL — AB (ref 0.61–1.24)
Calcium: 8.8 mg/dL — ABNORMAL LOW (ref 8.9–10.3)
GFR calc Af Amer: 48 mL/min — ABNORMAL LOW (ref >60)
GFR calc non Af Amer: 41 mL/min — ABNORMAL LOW (ref >60)
GLUCOSE: 119 mg/dL — AB (ref 65–99)
POTASSIUM: 3.9 mmol/L (ref 3.5–5.1)
SODIUM: 134 mmol/L — AB (ref 135–145)

## 2015-10-26 LAB — CBC
HEMATOCRIT: 27.9 % — AB (ref 40.0–52.0)
Hemoglobin: 9 g/dL — ABNORMAL LOW (ref 13.0–18.0)
MCH: 31.3 pg (ref 26.0–34.0)
MCHC: 32.4 g/dL (ref 32.0–36.0)
MCV: 96.8 fL (ref 80.0–100.0)
PLATELETS: 237 10*3/uL (ref 150–440)
RBC: 2.88 MIL/uL — ABNORMAL LOW (ref 4.40–5.90)
RDW: 16.2 % — AB (ref 11.5–14.5)
WBC: 30.7 10*3/uL — ABNORMAL HIGH (ref 3.8–10.6)

## 2015-10-26 LAB — LIPASE, BLOOD: Lipase: 16 U/L (ref 11–51)

## 2015-10-26 MED ORDER — DOXAZOSIN MESYLATE 4 MG PO TABS
4.0000 mg | ORAL_TABLET | Freq: Every day | ORAL | Status: DC
  Administered 2015-10-27 – 2015-10-28 (×2): 4 mg via ORAL
  Filled 2015-10-26 (×2): qty 1

## 2015-10-26 MED ORDER — SODIUM CHLORIDE 0.9% FLUSH
3.0000 mL | Freq: Two times a day (BID) | INTRAVENOUS | Status: DC
Start: 2015-10-26 — End: 2015-10-29
  Administered 2015-10-27 – 2015-10-28 (×2): 3 mL via INTRAVENOUS

## 2015-10-26 MED ORDER — ACETAMINOPHEN 325 MG PO TABS
650.0000 mg | ORAL_TABLET | Freq: Four times a day (QID) | ORAL | Status: DC | PRN
Start: 2015-10-26 — End: 2015-10-29
  Administered 2015-10-28: 650 mg via ORAL
  Filled 2015-10-26: qty 2

## 2015-10-26 MED ORDER — AMLODIPINE BESYLATE 10 MG PO TABS
10.0000 mg | ORAL_TABLET | Freq: Every day | ORAL | Status: DC
  Administered 2015-10-27 – 2015-10-28 (×2): 10 mg via ORAL
  Filled 2015-10-26 (×2): qty 1

## 2015-10-26 MED ORDER — LEVOFLOXACIN IN D5W 750 MG/150ML IV SOLN
750.0000 mg | Freq: Once | INTRAVENOUS | Status: AC
  Administered 2015-10-26: 750 mg via INTRAVENOUS
  Filled 2015-10-26: qty 150

## 2015-10-26 MED ORDER — IOHEXOL 240 MG/ML SOLN
25.0000 mL | Freq: Once | INTRAMUSCULAR | Status: AC | PRN
  Administered 2015-10-26: 25 mL via ORAL

## 2015-10-26 MED ORDER — ONDANSETRON HCL 4 MG/2ML IJ SOLN: 4.0000 mg | Freq: Four times a day (QID) | INTRAMUSCULAR | Status: DC | PRN

## 2015-10-26 MED ORDER — ONDANSETRON HCL 4 MG PO TABS: 4.0000 mg | ORAL_TABLET | Freq: Four times a day (QID) | ORAL | Status: DC | PRN

## 2015-10-26 MED ORDER — ENOXAPARIN SODIUM 40 MG/0.4ML ~~LOC~~ SOLN: 40.0000 mg | SUBCUTANEOUS | Status: DC

## 2015-10-26 MED ORDER — BRIMONIDINE TARTRATE 0.15 % OP SOLN
1.0000 [drp] | Freq: Three times a day (TID) | OPHTHALMIC | Status: DC
  Administered 2015-10-27 – 2015-10-28 (×5): 1 [drp] via OPHTHALMIC
  Filled 2015-10-26 (×2): qty 5

## 2015-10-26 MED ORDER — SODIUM CHLORIDE 0.9 % IV BOLUS (SEPSIS)
1000.0000 mL | Freq: Once | INTRAVENOUS | Status: AC
  Administered 2015-10-26: 1000 mL via INTRAVENOUS

## 2015-10-26 MED ORDER — SODIUM CHLORIDE 0.9 % IV SOLN
INTRAVENOUS | Status: AC
  Administered 2015-10-27: 02:00:00 via INTRAVENOUS

## 2015-10-26 MED ORDER — BRINZOLAMIDE 1 % OP SUSP
1.0000 [drp] | Freq: Three times a day (TID) | OPHTHALMIC | Status: DC
  Administered 2015-10-27 – 2015-10-28 (×4): 1 [drp] via OPHTHALMIC
  Filled 2015-10-26 (×2): qty 10

## 2015-10-26 MED ORDER — IOHEXOL 300 MG/ML  SOLN
80.0000 mL | Freq: Once | INTRAMUSCULAR | Status: AC | PRN
  Administered 2015-10-26: 80 mL via INTRAVENOUS

## 2015-10-26 MED ORDER — DONEPEZIL HCL 5 MG PO TABS
10.0000 mg | ORAL_TABLET | Freq: Every day | ORAL | Status: DC
  Administered 2015-10-27 – 2015-10-28 (×2): 10 mg via ORAL
  Filled 2015-10-26 (×2): qty 2

## 2015-10-26 MED ORDER — ACETAMINOPHEN 650 MG RE SUPP: 650.0000 mg | Freq: Four times a day (QID) | RECTAL | Status: DC | PRN

## 2015-10-26 NOTE — ED Provider Notes (Signed)
[facility] Emergency Department Provider Note  ____________________________________________  Time seen: 1:55 PM  I have reviewed the triage vital signs and the nursing notes.   HISTORY  Chief Complaint Weakness    HPI Alexander Goodwin is a 82 y.o. male brought to the ED due to generalized weakness. He recently was diagnosed with the flu. He states that he's had decreased oral intake but otherwise feels okay. Completely denies any chest pain or shortness of breath. No abdominal pain or back pain. No dizziness or syncope.     Past Medical History  Diagnosis Date  . Diverticulitis of colon   . History of colon polyps   . Arthritis     right knee, hips, neck. He denies any major limitation in activities  . GERD (gastroesophageal reflux disease)     well controlled with medication. No nocturnal symptoms on a regular basis  . Glaucoma     both eyes, followed closely by Dr. Janyth Contes  . Esophagitis     Last EGD Fall '11   . 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 Pancreatitis     resolved after GB surgery  . Stroke [facility])     TIAs - age 48  . BPH (benign prostatic hypertrophy) with urinary obstruction   . Hyperlipemia   . Hypertension   . Seasonal allergies   . Dysphagia, oropharyngeal   . Dementia      Patient Active Problem List   Diagnosis Date Noted  . Influenza with pneumonia 10/25/2015  . Hospital discharge follow-up 10/25/2015  . Sepsis ([place]) 10/16/2015  . Influenza A 10/16/2015  . HCAP (healthcare-associated pneumonia) 10/16/2015  . AKI (acute kidney injury) ([place]) 10/16/2015  . Alternating constipation and diarrhea 09/14/2015  . Dementia, vascular 06/15/2015  . Aspiration into airway 03/15/2015  . DNR (do not resuscitate) 09/15/2014  . History of lung abscess now with chronic cavitation right upper lobe secondary to chronic  aspiration 03/14/2012  . Hyperlipemia   . Hypertension   . History of colon polyps   . Arthritis   . GERD (gastroesophageal reflux disease)   . Glaucoma   . History of esophagitis   . History of TIA (transient ischemic attack)   . BPH (benign prostatic hyperplasia)      Past Surgical History  Procedure Laterality Date  . Cholecystectomy      '88 - laparotomy ([place])  . Appendectomy      '88 along with GB  . Bronchoscopy  1993  . Video bronchoscopy  03/26/2012    Procedure: VIDEO BRONCHOSCOPY WITH FLUORO;  Surgeon: Elsie Stain, MD;  Location: [facility];  Service: Cardiopulmonary;  Laterality: Bilateral;     Current Outpatient Rx  Name  Route  Sig  Dispense  Refill  . acetaminophen (TYLENOL ARTHRITIS PAIN) 650 MG CR tablet   Oral   Take 650 mg by mouth every 8 (eight) hours as needed.           Marland Kitchen amLODipine (NORVASC) 10 MG tablet   Oral   Take 10 mg by mouth daily.           . AZOPT 1 % ophthalmic suspension   Both Eyes   Place 1 drop into both eyes 2 (two) times daily.         . beta carotene w/minerals (OCUVITE) tablet   Oral  Take 1 tablet by mouth daily.         . brimonidine (ALPHAGAN P) 0.1 % SOLN   Both Eyes   Place 1 drop into both eyes 3 (three) times daily.          . calcium carbonate (TUMS - DOSED IN MG ELEMENTAL CALCIUM) 500 MG chewable tablet   Oral   Chew 1 tablet by mouth as needed for indigestion or heartburn.         . calcium-vitamin D (OSCAL 500/200 D-3) 500-200 MG-UNIT per tablet   Oral   Take 1 tablet by mouth daily.          Marland Kitchen donepezil (ARICEPT) 10 MG tablet   Oral   Take 10 mg by mouth at bedtime.         . donepezil (ARICEPT) 5 MG tablet      TAKE ONE TABLET AT BEDTIME MAY INCREASE TO TAKE TWO TABLETS AT BEDTIME AFTER -FOUR TO SIX WEEKS Patient not taking: Reported on 10/16/2015   90 tablet   1   . dorzolamide-timolol (COSOPT) 22.3-6.8 MG/ML ophthalmic solution   Both Eyes   Place 1 drop into both eyes 2  (two) times daily.         Marland Kitchen doxazosin (CARDURA) 8 MG tablet   Oral   Take 8 mg by mouth daily.         Marland Kitchen gabapentin (NEURONTIN) 300 MG capsule   Oral   Take 300 mg by mouth 2 (two) times daily as needed (for shingles).         . Iron, Ferrous Gluconate, 256 (28 FE) MG TABS   Oral   Take 1 tablet by mouth daily.         . Lactobacillus-Inulin ([place]) CAPS   Oral   Take 1 capsule by mouth daily.   30 capsule   0     PHARMACY MAY SUBSTITUTE ANOTHER AVAILABLE PRIOBIOT .Marland Kitchen.   . latanoprost (XALATAN) 0.005 % ophthalmic solution   Both Eyes   Place 1 drop into both eyes 2 (two) times daily.          Marland Kitchen levofloxacin (LEVAQUIN) 750 MG tablet   Oral   Take 1 tablet (750 mg total) by mouth every other day.   3 tablet   0   . omeprazole (PRILOSEC) 20 MG capsule   Oral   Take 20 mg by mouth daily.         . predniSONE (STERAPRED UNI-PAK 21 TAB) 10 MG (21) TBPK tablet      Take 6 tabs first day, 5 tab on day 2, then 4 on day 3rd, 3 tabs on day 4th , 2 tab on day 5th, and 1 tab on 6th day.   21 tablet   0   . sucralfate (CARAFATE) 1 G tablet   Oral   Take 1 tablet by mouth 2 (two) times daily before a meal.      0      Allergies Clindamycin/lincomycin and Erythromycin base   Family History  Problem Relation Age of Onset  . Diabetes Mother   . Hypertension Mother   . Heart disease Mother   . Stroke Father   . Heart disease Father   . Diabetes Father   . Rheum arthritis Father   . Arthritis Father   . Cancer Sister     uterine cancer  . COPD Brother   . Hypertension Brother   . Cancer Brother  colon. brain, and hip  . Diabetes Brother   . Cancer Brother   . Diabetes Brother   . Cancer Sister     gyn malignancy  . Cancer Sister     pancreatic cancer  . Allergies Sister   . Allergies Son   . Asthma Sister   . Asthma Son   . Diabetes Maternal Grandmother   . Diabetes Maternal Grandfather   . Diabetes Paternal  Grandmother   . Diabetes Paternal Grandfather     Social History Social History  Substance Use Topics  . Smoking status: Former Smoker -- 0.50 packs/day for 30 years    Types: Pipe, Cigars    Quit date: 08/20/1978  . Smokeless tobacco: Former Systems developer    Types: Chew    Quit date: 11/16/1990  . Alcohol Use: No    Review of Systems  Constitutional:   No fever or chills. No weight changes. Generalized weakness. Eyes:   No blurry vision or double vision.  ENT:   No sore throat.  Cardiovascular:   No chest pain. Respiratory:   No dyspnea or cough. Gastrointestinal:   Negative for abdominal pain, vomiting and diarrhea.  No BRBPR or melena. Decreased appetite and intake. Genitourinary:   Negative for dysuria or difficulty urinating. Musculoskeletal:   Negative for back pain. No joint swelling or pain. Skin:   Negative for rash. Neurological:   Negative for headaches, focal weakness or numbness. Psychiatric:  No anxiety or depression.   Endocrine:  No changes in energy or sleep difficulty.  10-point ROS otherwise negative.  ____________________________________________   PHYSICAL EXAM:  VITAL SIGNS: ED Triage Vitals  Enc Vitals Group     BP 10/26/15 1216 112/48 mmHg     Pulse Rate 10/26/15 1216 68     Resp 10/26/15 1216 20     Temp 10/26/15 1216 98.6 F (37 C)     Temp Source 10/26/15 1216 Oral     SpO2 10/26/15 1216 95 %     Weight 10/26/15 1216 160 lb (72.576 kg)     Height 10/26/15 1216 5\' 6"  (1.676 m)     Head Cir --      Peak Flow --      Pain Score 10/26/15 1217 0     Pain Loc --      Pain Edu? --      Excl. in [place]? --     Vital signs reviewed, nursing assessments reviewed.   Constitutional:   Alert and oriented. Well appearing and in no distress. Eyes:   No scleral icterus. No conjunctival pallor. PERRL. EOMI ENT   Head:   Normocephalic and atraumatic.   Nose:   No congestion/rhinnorhea. No septal hematoma   Mouth/Throat:   Dry mucous membranes,  no pharyngeal erythema. No peritonsillar mass.    Neck:   No stridor. No SubQ emphysema. No meningismus. Hematological/Lymphatic/Immunilogical:   No cervical lymphadenopathy. Cardiovascular:   RRR. Symmetric bilateral radial and DP pulses.  No murmurs.  Respiratory:   Normal respiratory effort without tachypnea nor retractions. Breath sounds are clear and equal bilaterally. No wheezes/rales/rhonchi. Gastrointestinal:   Soft with right upper quadrant tenderness. Non distended. There is no CVA tenderness.  No rebound, rigidity, or guarding. Genitourinary:   deferred Musculoskeletal:   Nontender with normal range of motion in all extremities. No joint effusions.  No lower extremity tenderness.  No edema. Neurologic:   Normal speech and language.  CN 2-10 normal. Motor grossly intact. No gross focal neurologic deficits are  appreciated.  Skin:    Skin is warm, dry and intact. No rash noted.  No petechiae, purpura, or bullae. Psychiatric:   Mood and affect are normal. ____________________________________________    LABS (pertinent positives/negatives) (all labs ordered are listed, but only abnormal results are displayed) Labs Reviewed  BASIC METABOLIC PANEL - Abnormal; Notable for the following:    Sodium 134 (*)    Glucose, Bld 119 (*)    BUN 25 (*)    Creatinine, Ser 1.42 (*)    Calcium 8.8 (*)    GFR calc non Af Amer 41 (*)    GFR calc Af Amer 48 (*)    All other components within normal limits  CBC - Abnormal; Notable for the following:    WBC 30.7 (*)    RBC 2.88 (*)    Hemoglobin 9.0 (*)    HCT 27.9 (*)    RDW 16.2 (*)    All other components within normal limits  HEPATIC FUNCTION PANEL - Abnormal; Notable for the following:    Total Protein 5.6 (*)    Albumin 2.9 (*)    ALT 105 (*)    Total Bilirubin 1.3 (*)    Indirect Bilirubin 1.1 (*)    All other components within normal limits  LIPASE, BLOOD  URINALYSIS COMPLETEWITH MICROSCOPIC ([facility] ONLY)    ____________________________________________   EKG  Interpreted by me Sinus rhythm rate of 62, normal axis and intervals. Normal QRS and ST segments. T wave inversions in lead 3 and aVF.  ____________________________________________    RADIOLOGY  Chest x-ray shows right-sided infiltrate, increasing in size.  Ultrasound right upper quadrant shows dilated CBD to 12 mm. Surgically absent gallbladder.  CT chest abdomen pelvis shows right lower lobe infiltrate with air bronchograms consistent with pneumonia. There is a large hiatal hernia but otherwise no acute findings in the abdomen or pelvis to explain the CBD dilatation and elevated LFTs.  ____________________________________________   PROCEDURES   ____________________________________________   INITIAL IMPRESSION / ASSESSMENT AND PLAN / ED COURSE  Pertinent labs & imaging results that were available during my care of the patient were reviewed by me and considered in my medical decision making (see chart for details).  Patient presents with generalized weakness found to have a leukocytosis of 30,000. Due to this and his right upper quadrant tenderness, ultrasound was performed as well as chest x-ray. The suggested that the patient had pneumonia, but with the elevated LFTs oriented to get more information to ensure the patient didn't have a pancreatic mass or possibly cholangitis. CT essentially was unremarkable except for further delineating the right lower lobe pneumonia. Give the patient IV fluids and IV Levaquin, case discussed with the hospitalist at 7:15 PM for admission.     ____________________________________________   FINAL CLINICAL IMPRESSION(S) / ED DIAGNOSES  Final diagnoses:  RLL pneumonia   leukocytosis Hyperbilirubinemia    Carrie Mew, MD 10/26/15 [phone]

## 2015-10-26 NOTE — ED Notes (Signed)
Pt to ed via ems from [place] for weakness.

## 2015-10-26 NOTE — ED Notes (Signed)
Returned from c t scan. Pt up to use urinal.

## 2015-10-26 NOTE — ED Notes (Signed)
Pt unable to provide urine specimen at this time

## 2015-10-26 NOTE — ED Notes (Signed)
Pt to ed from [place] with a c/o weakness since being diagnosed with the flu last week.  Pt states he has had decreased appetite and overall weakness since last week but reports he did not want to come to the ed for eval.  States "they ganged up on me and forced me to come here.  They are a bunch of jacklegs over there"  Pt alert and oriented and skin warm and dry.  Iv started, blood drawn and sent and pt tolerated well.

## 2015-10-26 NOTE — H&P (Signed)
[facility] NAME: Alexander Goodwin    MR#:  [id number]  DATE OF BIRTH:  [birth year]  DATE OF ADMISSION:  10/26/2015  PRIMARY CARE PHYSICIAN: Thersa Salt, DO   REQUESTING/REFERRING PHYSICIAN: Joni Fears, MD  CHIEF COMPLAINT:   Chief Complaint  Patient presents with  . Weakness    HISTORY OF PRESENT ILLNESS:  Alexander Goodwin  is a 82 y.o. male who presents with persistent malaise and weakness. Workup in the ED included CT chest abdomen and pelvis due to an initial suspicion for gallbladder or pancreatic pathology. CT chest showed right lower lobe pneumonia. Patient was recently admitted here with influenza and pneumonia. Given elevated white count today of 30,000, which is significantly elevated again after it had returned closer to normal during his prior admission, hospitals were called for admission for healthcare associated pneumonia.  PAST MEDICAL HISTORY:   Past Medical History  Diagnosis Date  . Diverticulitis of colon   . History of colon polyps   . Arthritis     right knee, hips, neck. He denies any major limitation in activities  . GERD (gastroesophageal reflux disease)     well controlled with medication. No nocturnal symptoms on a regular basis  . Glaucoma     both eyes, followed closely by Dr. Janyth Contes  . Esophagitis     Last EGD Fall '11   . Abscess of lung without pneumonia ([place])     Right upper lobe chronic lung abscess and prior left upper lobe lung abscess due to chronic aspiration  . Pancreatitis     resolved after GB surgery  . Stroke [facility])     TIAs - age 55  . BPH (benign prostatic hypertrophy) with urinary obstruction   . Hyperlipemia   . Hypertension   . Seasonal allergies   . Dysphagia, oropharyngeal   . Dementia     PAST SURGICAL HISTORY:   Past Surgical History  Procedure Laterality Date  . Cholecystectomy      '88 - laparotomy ([place])  . Appendectomy      '88 along with GB  . Bronchoscopy   1993  . Video bronchoscopy  03/26/2012    Procedure: VIDEO BRONCHOSCOPY WITH FLUORO;  Surgeon: Elsie Stain, MD;  Location: [facility];  Service: Cardiopulmonary;  Laterality: Bilateral;    SOCIAL HISTORY:   Social History  Substance Use Topics  . Smoking status: Former Smoker -- 0.50 packs/day for 30 years    Types: Pipe, Cigars    Quit date: 08/20/1978  . Smokeless tobacco: Former Systems developer    Types: Chew    Quit date: 11/16/1990  . Alcohol Use: No    FAMILY HISTORY:   Family History  Problem Relation Age of Onset  . Diabetes Mother   . Hypertension Mother   . Heart disease Mother   . Stroke Father   . Heart disease Father   . Diabetes Father   . Rheum arthritis Father   . Arthritis Father   . Cancer Sister     uterine cancer  . COPD Brother   . Hypertension Brother   . Cancer Brother     colon. brain, and hip  . Diabetes Brother   . Cancer Brother   . Diabetes Brother   . Cancer Sister     gyn malignancy  . Cancer Sister     pancreatic cancer  . Allergies Sister   . Allergies Son   . Asthma Sister   .  Asthma Son   . Diabetes Maternal Grandmother   . Diabetes Maternal Grandfather   . Diabetes Paternal Grandmother   . Diabetes Paternal Grandfather     DRUG ALLERGIES:   Allergies  Allergen Reactions  . Clindamycin/Lincomycin Itching  . Erythromycin Base Rash    MEDICATIONS AT HOME:   Prior to Admission medications   Medication Sig Start Date End Date Taking? Authorizing Provider  acetaminophen (TYLENOL ARTHRITIS PAIN) 650 MG CR tablet Take 650 mg by mouth every 8 (eight) hours as needed for pain.    Yes Historical Provider, MD  acidophilus (RISAQUAD) CAPS capsule Take 1 capsule by mouth daily.   Yes Historical Provider, MD  amLODipine (NORVASC) 10 MG tablet Take 10 mg by mouth daily.     Yes Historical Provider, MD  beta carotene w/minerals (OCUVITE) tablet Take 1 tablet by mouth daily.   Yes Historical Provider, MD  brimonidine (ALPHAGAN P) 0.1 %  SOLN Place 1 drop into both eyes 3 (three) times daily.    Yes Historical Provider, MD  brinzolamide (AZOPT) 1 % ophthalmic suspension Place 1 drop into both eyes 3 (three) times daily.    Yes Historical Provider, MD  calcium carbonate (TUMS - DOSED IN MG ELEMENTAL CALCIUM) 500 MG chewable tablet Chew 1 tablet by mouth as needed for indigestion or heartburn.   Yes Historical Provider, MD  calcium-vitamin D (OSCAL 500/200 D-3) 500-200 MG-UNIT per tablet Take 1 tablet by mouth daily.    Yes Historical Provider, MD  donepezil (ARICEPT) 10 MG tablet Take 10 mg by mouth at bedtime.   Yes Historical Provider, MD  doxazosin (CARDURA) 4 MG tablet Take 4 mg by mouth daily.   Yes Historical Provider, MD  gabapentin (NEURONTIN) 300 MG capsule Take 300 mg by mouth 2 (two) times daily as needed (for shingle pain).    Yes Historical Provider, MD  Iron, Ferrous Gluconate, 256 (28 FE) MG TABS Take 1 tablet by mouth daily.   Yes Historical Provider, MD  latanoprost (XALATAN) 0.005 % ophthalmic solution Place 1 drop into both eyes at bedtime.    Yes Historical Provider, MD  levofloxacin (LEVAQUIN) 750 MG tablet Take 1 tablet (750 mg total) by mouth every other day. 10/20/15  Yes Vaughan Basta, MD  sucralfate (CARAFATE) 1 G tablet Take 1 tablet by mouth 2 (two) times daily before a meal. 01/17/15  Yes Historical Provider, MD    REVIEW OF SYSTEMS:  Review of Systems  Constitutional: Positive for malaise/fatigue. Negative for fever, chills and weight loss.  HENT: Negative for ear pain, hearing loss and tinnitus.   Eyes: Negative for blurred vision, double vision, pain and redness.  Respiratory: Negative for cough, hemoptysis and shortness of breath.   Cardiovascular: Negative for chest pain, palpitations, orthopnea and leg swelling.  Gastrointestinal: Negative for nausea, vomiting, abdominal pain, diarrhea and constipation.  Genitourinary: Negative for dysuria, frequency and hematuria.  Musculoskeletal:  Negative for back pain, joint pain and neck pain.  Skin:       No acne, rash, or lesions  Neurological: Positive for weakness. Negative for dizziness, tremors and focal weakness.  Endo/Heme/Allergies: Negative for polydipsia. Does not bruise/bleed easily.  Psychiatric/Behavioral: Negative for depression. The patient is not nervous/anxious and does not have insomnia.      VITAL SIGNS:   Filed Vitals:   10/26/15 2020 10/26/15 2028 10/26/15 2050 10/26/15 2100  BP: 133/56  110/58 133/61  Pulse: 67 88 65 77  Temp:      TempSrc:  Resp: 21 16 17 20   Height:      Weight:      SpO2: 97% 92% 98% 96%   Wt Readings from Last 3 Encounters:  10/26/15 72.576 kg (160 lb)  10/24/15 74.277 kg (163 lb 12 oz)  10/16/15 71.033 kg (156 lb 9.6 oz)    PHYSICAL EXAMINATION:  Physical Exam  Vitals reviewed. Constitutional: He is oriented to person, place, and time. He appears well-developed and well-nourished. No distress.  HENT:  Head: Normocephalic and atraumatic.  Mouth/Throat: Oropharynx is clear and moist.  Eyes: Conjunctivae and EOM are normal. Pupils are equal, round, and reactive to light. No scleral icterus.  Neck: Normal range of motion. Neck supple. No JVD present. No thyromegaly present.  Cardiovascular: Normal rate, regular rhythm and intact distal pulses.  Exam reveals no gallop and no friction rub.   No murmur heard. Respiratory: Effort normal. No respiratory distress. He has no wheezes. He has no rales.  Course right lower lobe breath sounds  GI: Soft. Bowel sounds are normal. He exhibits no distension. There is no tenderness.  Musculoskeletal: Normal range of motion. He exhibits no edema.  No arthritis, no gout  Lymphadenopathy:    He has no cervical adenopathy.  Neurological: He is alert and oriented to person, place, and time. No cranial nerve deficit.  No dysarthria, no aphasia  Skin: Skin is warm and dry. No rash noted. No erythema.  Psychiatric: He has a normal mood  and affect. His behavior is normal. Judgment and thought content normal.    LABORATORY PANEL:   CBC  Recent Labs Lab 10/26/15 1215  WBC 30.7*  HGB 9.0*  HCT 27.9*  PLT 237   ------------------------------------------------------------------------------------------------------------------  Chemistries   Recent Labs Lab 10/26/15 1215  NA 134*  K 3.9  CL 101  CO2 26  GLUCOSE 119*  BUN 25*  CREATININE 1.42*  CALCIUM 8.8*  AST 37  ALT 105*  ALKPHOS 86  BILITOT 1.3*   ------------------------------------------------------------------------------------------------------------------  Cardiac Enzymes No results for input(s): TROPONINI in the last 168 hours. ------------------------------------------------------------------------------------------------------------------  RADIOLOGY:  Dg Chest 2 View  10/26/2015  CLINICAL DATA:  Weakness and recent diagnosis of flu. EXAM: CHEST  2 VIEW COMPARISON:  10/16/2015 FINDINGS: Cardiac shadow is stable. The left lung remains clear. The right lung demonstrates persistent changes in the mid and upper lung with new infiltrate in the right lung base. This projects in the right lower lobe. No acute bony abnormality is noted. IMPRESSION: Persistent increasing right sided infiltrate. Ache and followup in 3-4 weeks to assess for improvement is recommended. Electronically Signed   By: Inez Catalina M.D.   On: 10/26/2015 15:02   Ct Chest W Contrast  10/26/2015  CLINICAL DATA:  Weakness since being diagnosed with the flu last week, decreased appetite, abnormal chest radiograph question lung mass EXAM: CT CHEST, ABDOMEN, AND PELVIS WITH CONTRAST TECHNIQUE: Multidetector CT imaging of the chest, abdomen and pelvis was performed following the standard protocol during bolus administration of intravenous contrast. Sagittal and coronal MPR images reconstructed from axial data set. CONTRAST:  57mL OMNIPAQUE IOHEXOL 300 MG/ML SOLN IV. Dilute oral contrast.  COMPARISON:  CT chest 10/06/2013 ; correlation chest radiograph 10/26/2015 FINDINGS: CT CHEST FINDINGS Mediastinum/Lymph Nodes: Calcified mediastinal and hilar lymph nodes. No definite thoracic adenopathy. Lungs/Pleura: Stellate biapical scarring, less prominent on RIGHT than on previous study. Central RIGHT lower lobe infiltrate with few air bronchograms consistent with pneumonia. Dependent atelectasis in the posterior lower lobes. Abnormal soft tissue density in  RIGHT perihilar region extending into stellate areas of opacity in the RIGHT middle and RIGHT upper lobes, overall decreased since 2015, question improved atelectasis though difficult to entirely exclude tumor. No pleural effusion or pneumothorax. Musculoskeletal: Osseous demineralization with compression fractures of T6-T8 superior endplates. Vascular: Atherosclerotic calcifications aorta and coronary arteries. Aorta normal caliber. Vascular structures grossly patent on nondedicated exam. CT ABDOMEN PELVIS FINDINGS Hepatobiliary: Mild intrahepatic and extrahepatic biliary dilatation post cholecystectomy, could potentially be physiologic. Liver otherwise unremarkable. Pancreas: Small cystic lesion at pancreatic tail 15 x 13 mm image 59. Remainder of pancreas unremarkable. Spleen: Normal appearance. Small splenule at anterior margin of inferior spleen medially. Adrenals/Urinary Tract: No adrenal mass. Cyst upper pole LEFT kidney 19 x 18 mm image 60. Kidneys, ureters, and bladder otherwise unremarkable. Stomach/Bowel: Moderate to large hiatal hernia. Unable to exclude gastric wall thickening versus artifact from underdistention. Sigmoid and descending diverticulosis without evidence of diverticulitis. Bowel loops otherwise unremarkable. Normal appendix. Vascular/Lymphatic: Extensive atherosclerotic calcifications. Reproductive: N/A Other: No mass, adenopathy, free air or free fluid. Minimally increased nonspecific lymphovascular prominence within the greater  omentum without ascites for additional peritoneal findings, question related to remote surgery. Musculoskeletal: Diffuse osseous demineralization. Multilevel degenerative disc and facet disease changes lumbar spine with dextro convex scoliosis. IMPRESSION: RIGHT lower lobe infiltrate. Density in the RIGHT perihilar region extending into the upper and middle lobe appears improved since 2015, question postinflammatory scarring or residual atelectasis. Old granulomatous disease. Extensive atherosclerotic calcification. Moderate to large hiatal hernia. Distal colonic diverticulosis without diverticulitis changes. Nonspecific cystic lesion at pancreatic tail 15 x 13 mm ; if clinically indicated, characterization and stability of this finding can be assessed by followup MR imaging in 6 months. Electronically Signed   By: Lavonia Dana M.D.   On: 10/26/2015 17:24   Ct Abdomen Pelvis W Contrast  10/26/2015  CLINICAL DATA:  Weakness since being diagnosed with the flu last week, decreased appetite, abnormal chest radiograph question lung mass EXAM: CT CHEST, ABDOMEN, AND PELVIS WITH CONTRAST TECHNIQUE: Multidetector CT imaging of the chest, abdomen and pelvis was performed following the standard protocol during bolus administration of intravenous contrast. Sagittal and coronal MPR images reconstructed from axial data set. CONTRAST:  48mL OMNIPAQUE IOHEXOL 300 MG/ML SOLN IV. Dilute oral contrast. COMPARISON:  CT chest 10/06/2013 ; correlation chest radiograph 10/26/2015 FINDINGS: CT CHEST FINDINGS Mediastinum/Lymph Nodes: Calcified mediastinal and hilar lymph nodes. No definite thoracic adenopathy. Lungs/Pleura: Stellate biapical scarring, less prominent on RIGHT than on previous study. Central RIGHT lower lobe infiltrate with few air bronchograms consistent with pneumonia. Dependent atelectasis in the posterior lower lobes. Abnormal soft tissue density in RIGHT perihilar region extending into stellate areas of opacity in the  RIGHT middle and RIGHT upper lobes, overall decreased since 2015, question improved atelectasis though difficult to entirely exclude tumor. No pleural effusion or pneumothorax. Musculoskeletal: Osseous demineralization with compression fractures of T6-T8 superior endplates. Vascular: Atherosclerotic calcifications aorta and coronary arteries. Aorta normal caliber. Vascular structures grossly patent on nondedicated exam. CT ABDOMEN PELVIS FINDINGS Hepatobiliary: Mild intrahepatic and extrahepatic biliary dilatation post cholecystectomy, could potentially be physiologic. Liver otherwise unremarkable. Pancreas: Small cystic lesion at pancreatic tail 15 x 13 mm image 59. Remainder of pancreas unremarkable. Spleen: Normal appearance. Small splenule at anterior margin of inferior spleen medially. Adrenals/Urinary Tract: No adrenal mass. Cyst upper pole LEFT kidney 19 x 18 mm image 60. Kidneys, ureters, and bladder otherwise unremarkable. Stomach/Bowel: Moderate to large hiatal hernia. Unable to exclude gastric wall thickening versus artifact from underdistention. Sigmoid  and descending diverticulosis without evidence of diverticulitis. Bowel loops otherwise unremarkable. Normal appendix. Vascular/Lymphatic: Extensive atherosclerotic calcifications. Reproductive: N/A Other: No mass, adenopathy, free air or free fluid. Minimally increased nonspecific lymphovascular prominence within the greater omentum without ascites for additional peritoneal findings, question related to remote surgery. Musculoskeletal: Diffuse osseous demineralization. Multilevel degenerative disc and facet disease changes lumbar spine with dextro convex scoliosis. IMPRESSION: RIGHT lower lobe infiltrate. Density in the RIGHT perihilar region extending into the upper and middle lobe appears improved since 2015, question postinflammatory scarring or residual atelectasis. Old granulomatous disease. Extensive atherosclerotic calcification. Moderate to large  hiatal hernia. Distal colonic diverticulosis without diverticulitis changes. Nonspecific cystic lesion at pancreatic tail 15 x 13 mm ; if clinically indicated, characterization and stability of this finding can be assessed by followup MR imaging in 6 months. Electronically Signed   By: Lavonia Dana M.D.   On: 10/26/2015 17:24   US Abdomen Limited Ruq  10/26/2015  CLINICAL DATA:  RIGHT upper quadrant pain and tenderness for 1 day, leukocytosis, history GERD, pancreatitis, hypertension, hyperlipidemia, former smoker EXAM: US ABDOMEN LIMITED - RIGHT UPPER QUADRANT COMPARISON:  None FINDINGS: Gallbladder: Surgically absent Common bile duct: Diameter: Dilated, 12 mm diameter Liver: Normal appearance.  No intrahepatic biliary dilatation. No RIGHT upper quadrant free fluid. IMPRESSION: Dilated CBD post cholecystectomy, uncertain if physiologic or pathologic ; no intrahepatic biliary dilatation is identified. Correlation with LFTs recommended. Remainder of exam unremarkable. Electronically Signed   By: Lavonia Dana M.D.   On: 10/26/2015 15:47    EKG:   Orders placed or performed during the hospital encounter of 10/26/15  . ED EKG  . ED EKG  . EKG 12-Lead  . EKG 12-Lead    IMPRESSION AND PLAN:  Principal Problem:   HCAP (healthcare-associated pneumonia) - broad spectrum antibiotics ordered, blood and sputum cultures ordered. Patient was recently treated for pneumonia. We'll monitor his white blood cell count for improvement. Active Problems:   Hypertension - continue home meds, stable   Dementia, vascular - continue donepezil   GERD (gastroesophageal reflux disease) - home dose PPI   BPH (benign prostatic hyperplasia) - continue doxazosin  All the records are reviewed and case discussed with ED provider. Management plans discussed with the patient and/or family.  DVT PROPHYLAXIS: SubQ lovenox  GI PROPHYLAXIS: None  ADMISSION STATUS: Inpatient  CODE STATUS: DNR Code Status History    Date  Active Date Inactive Code Status Order ID Comments User Context   10/16/2015  2:44 AM 10/19/2015  6:09 PM DNR [id number]  Lance Coon, MD ED   02/06/2015 10:41 PM 02/07/2015  6:08 PM DNR [id number]  Lytle Butte, MD ED    Questions for Most Recent Historical Code Status (Order [id number])    Question Answer Comment   In the event of cardiac or respiratory ARREST Do not call a "code blue"    In the event of cardiac or respiratory ARREST Do not perform Intubation, CPR, defibrillation or ACLS    In the event of cardiac or respiratory ARREST Use medication by any route, position, wound care, and other measures to relive pain and suffering. May use oxygen, suction and manual treatment of airway obstruction as needed for comfort.       TOTAL TIME TAKING CARE OF THIS PATIENT: 45 minutes.    Torez Beauregard [place] 10/26/2015, 9:28 PM  [facility]  Office  [phone]  CC: Primary care physician; Thersa Salt, DO

## 2015-10-27 ENCOUNTER — Encounter
Admission: RE | Admit: 2015-10-27 | Discharge: 2015-10-27 | Disposition: A | Discharge status: Home / Self Care | Payer: Medicare Other | Source: Ambulatory Visit | Attending: Internal Medicine | Admitting: Internal Medicine

## 2015-10-27 LAB — CREATININE, SERUM
Creatinine, Ser: 1.05 mg/dL (ref 0.61–1.24)
GFR, EST NON AFRICAN AMERICAN: 59 mL/min — AB (ref >60)

## 2015-10-27 LAB — CBC
HCT: 30 % — ABNORMAL LOW (ref 40.0–52.0)
Hemoglobin: 9.8 g/dL — ABNORMAL LOW (ref 13.0–18.0)
MCH: 31.5 pg (ref 26.0–34.0)
MCHC: 32.6 g/dL (ref 32.0–36.0)
MCV: 96.6 fL (ref 80.0–100.0)
PLATELETS: 236 10*3/uL (ref 150–440)
RBC: 3.11 MIL/uL — ABNORMAL LOW (ref 4.40–5.90)
RDW: 16.6 % — AB (ref 11.5–14.5)
WBC: 21.9 10*3/uL — AB (ref 3.8–10.6)

## 2015-10-27 LAB — MRSA PCR SCREENING: MRSA by PCR: NEGATIVE

## 2015-10-27 LAB — INFLUENZA PANEL BY PCR (TYPE A & B)
H1N1 flu by pcr: NOT DETECTED
Influenza A By PCR: NEGATIVE
Influenza B By PCR: NEGATIVE

## 2015-10-27 MED ORDER — VANCOMYCIN HCL 10 G IV SOLR
1250.0000 mg | Freq: Once | INTRAVENOUS | Status: AC
  Administered 2015-10-27: 1250 mg via INTRAVENOUS
  Filled 2015-10-27: qty 1250

## 2015-10-27 MED ORDER — ENOXAPARIN SODIUM 30 MG/0.3ML ~~LOC~~ SOLN: 30.0000 mg | SUBCUTANEOUS | Status: DC

## 2015-10-27 MED ORDER — PIPERACILLIN-TAZOBACTAM 4.5 G IVPB
4.5000 g | Freq: Three times a day (TID) | INTRAVENOUS | Status: DC
  Administered 2015-10-27 – 2015-10-28 (×5): 4.5 g via INTRAVENOUS
  Filled 2015-10-27 (×7): qty 100

## 2015-10-27 MED ORDER — VANCOMYCIN HCL IN DEXTROSE 1-5 GM/200ML-% IV SOLN
1000.0000 mg | INTRAVENOUS | Status: DC
  Filled 2015-10-27: qty 200

## 2015-10-27 MED ORDER — ENOXAPARIN SODIUM 40 MG/0.4ML ~~LOC~~ SOLN
40.0000 mg | SUBCUTANEOUS | Status: DC
  Administered 2015-10-27 – 2015-10-28 (×2): 40 mg via SUBCUTANEOUS
  Filled 2015-10-27 (×2): qty 0.4

## 2015-10-27 NOTE — Progress Notes (Signed)
Pharmacy Antibiotic Note  Alexander Goodwin is a 82 y.o. male admitted on 10/26/2015 with pneumonia.  Pharmacy has been consulted for vancomycin and Zosyn dosing.  Plan: DW 72.6kg  Vd 51L kei 0.028 hr-1  t1/2  25 hours Vancomycin 1250 mg IV q 36 hours ordered with stacked dosing. Level before 4th dose. Goal 15-20.  Zosyn 4.5 grams q 8 hours ordered for Pseudomonas risk of recent abx usage.  Height: 5\' 6"  (167.6 cm) Weight: 160 lb (72.576 kg) IBW/kg (Calculated) : 63.8  Temp (24hrs), Avg:98.5 F (36.9 C), Min:98.4 F (36.9 C), Max:98.6 F (37 C)   Recent Labs Lab 10/26/15 1215  WBC 30.7*  CREATININE 1.42*    Estimated Creatinine Clearance: 29.3 mL/min (by C-G formula based on Cr of 1.42).    Allergies  Allergen Reactions  . Clindamycin/Lincomycin Itching  . Erythromycin Base Rash    Antimicrobials this admission:   >>    >>   Dose adjustments this admission:   Microbiology results:   3/8 Sputum: pending  2/26 MRSA PCR: (-)  CXR: R side infiltrate 3/8 UA: pending  Thank you for allowing pharmacy to be a part of this patient's care.  Brandn Mcgath S 10/27/2015 12:27 AM

## 2015-10-27 NOTE — Progress Notes (Addendum)
Patient ID: Alexander Goodwin, male   DOB: 31-Dec-1920, 82 y.o.   MRN: [id number] [place] [facility] [place] NAME: Alexander Goodwin    MR#:  [id number]  DATE OF BIRTH:  Dec 18, 1920  SUBJECTIVE:  Out in the recliner Feels weak. No fever  REVIEW OF SYSTEMS:   Review of Systems  Constitutional: Negative for fever, chills and weight loss.  HENT: Negative for ear discharge, ear pain and nosebleeds.   Eyes: Negative for blurred vision, pain and discharge.  Respiratory: Positive for cough and shortness of breath. Negative for sputum production, wheezing and stridor.   Cardiovascular: Negative for chest pain, palpitations, orthopnea and PND.  Gastrointestinal: Negative for nausea, vomiting, abdominal pain and diarrhea.  Genitourinary: Negative for urgency and frequency.  Musculoskeletal: Negative for back pain and joint pain.  Neurological: Negative for sensory change, speech change, focal weakness and weakness.  Psychiatric/Behavioral: Negative for depression and hallucinations. The patient is not nervous/anxious.   All other systems reviewed and are negative.  Tolerating Diet:yes  DRUG ALLERGIES:   Allergies  Allergen Reactions  . Clindamycin/Lincomycin Itching  . Erythromycin Base Rash    VITALS:  Blood pressure 157/63, pulse 72, temperature 98.4 F (36.9 C), temperature source Oral, resp. rate 18, height 5\' 6"  (1.676 m), weight 72.576 kg (160 lb), SpO2 99 %.  PHYSICAL EXAMINATION:   Physical Exam  GENERAL:  82 y.o.-year-old patient lying in the bed with no acute distress.  EYES: Pupils equal, round, reactive to light and accommodation. No scleral icterus. Extraocular muscles intact.  HEENT: Head atraumatic, normocephalic. Oropharynx and nasopharynx clear.  NECK:  Supple, no jugular venous distention. No thyroid enlargement, no tenderness.  LUNGS: Normal breath sounds bilaterally, no wheezing, rales, rhonchi. No use of accessory muscles of  respiration.  CARDIOVASCULAR: S1, S2 normal. No murmurs, rubs, or gallops.  ABDOMEN: Soft, nontender, nondistended. Bowel sounds present. No organomegaly or mass.  EXTREMITIES: No cyanosis, clubbing or edema b/l.    NEUROLOGIC: Cranial nerves II through XII are intact. No focal Motor or sensory deficits b/l.   PSYCHIATRIC:  patient is alert and oriented x 3.  SKIN: No obvious rash, lesion, or ulcer.   LABORATORY PANEL:  CBC  Recent Labs Lab 10/27/15 0106  WBC 21.9*  HGB 9.8*  HCT 30.0*  PLT 236    Chemistries   Recent Labs Lab 10/26/15 1215 10/27/15 0106  NA 134*  --   K 3.9  --   CL 101  --   CO2 26  --   GLUCOSE 119*  --   BUN 25*  --   CREATININE 1.42* 1.05  CALCIUM 8.8*  --   AST 37  --   ALT 105*  --   ALKPHOS 86  --   BILITOT 1.3*  --    Cardiac Enzymes No results for input(s): TROPONINI in the last 168 hours. RADIOLOGY:  Dg Chest 2 View  10/26/2015  CLINICAL DATA:  Weakness and recent diagnosis of flu. EXAM: CHEST  2 VIEW COMPARISON:  10/16/2015 FINDINGS: Cardiac shadow is stable. The left lung remains clear. The right lung demonstrates persistent changes in the mid and upper lung with new infiltrate in the right lung base. This projects in the right lower lobe. No acute bony abnormality is noted. IMPRESSION: Persistent increasing right sided infiltrate. Ache and followup in 3-4 weeks to assess for improvement is recommended. Electronically Signed   By: Inez Catalina M.D.   On: 10/26/2015 15:02  Ct Chest W Contrast  10/26/2015  CLINICAL DATA:  Weakness since being diagnosed with the flu last week, decreased appetite, abnormal chest radiograph question lung mass EXAM: CT CHEST, ABDOMEN, AND PELVIS WITH CONTRAST TECHNIQUE: Multidetector CT imaging of the chest, abdomen and pelvis was performed following the standard protocol during bolus administration of intravenous contrast. Sagittal and coronal MPR images reconstructed from axial data set. CONTRAST:  67mL OMNIPAQUE  IOHEXOL 300 MG/ML SOLN IV. Dilute oral contrast. COMPARISON:  CT chest 10/06/2013 ; correlation chest radiograph 10/26/2015 FINDINGS: CT CHEST FINDINGS Mediastinum/Lymph Nodes: Calcified mediastinal and hilar lymph nodes. No definite thoracic adenopathy. Lungs/Pleura: Stellate biapical scarring, less prominent on RIGHT than on previous study. Central RIGHT lower lobe infiltrate with few air bronchograms consistent with pneumonia. Dependent atelectasis in the posterior lower lobes. Abnormal soft tissue density in RIGHT perihilar region extending into stellate areas of opacity in the RIGHT middle and RIGHT upper lobes, overall decreased since 2015, question improved atelectasis though difficult to entirely exclude tumor. No pleural effusion or pneumothorax. Musculoskeletal: Osseous demineralization with compression fractures of T6-T8 superior endplates. Vascular: Atherosclerotic calcifications aorta and coronary arteries. Aorta normal caliber. Vascular structures grossly patent on nondedicated exam. CT ABDOMEN PELVIS FINDINGS Hepatobiliary: Mild intrahepatic and extrahepatic biliary dilatation post cholecystectomy, could potentially be physiologic. Liver otherwise unremarkable. Pancreas: Small cystic lesion at pancreatic tail 15 x 13 mm image 59. Remainder of pancreas unremarkable. Spleen: Normal appearance. Small splenule at anterior margin of inferior spleen medially. Adrenals/Urinary Tract: No adrenal mass. Cyst upper pole LEFT kidney 19 x 18 mm image 60. Kidneys, ureters, and bladder otherwise unremarkable. Stomach/Bowel: Moderate to large hiatal hernia. Unable to exclude gastric wall thickening versus artifact from underdistention. Sigmoid and descending diverticulosis without evidence of diverticulitis. Bowel loops otherwise unremarkable. Normal appendix. Vascular/Lymphatic: Extensive atherosclerotic calcifications. Reproductive: N/A Other: No mass, adenopathy, free air or free fluid. Minimally increased  nonspecific lymphovascular prominence within the greater omentum without ascites for additional peritoneal findings, question related to remote surgery. Musculoskeletal: Diffuse osseous demineralization. Multilevel degenerative disc and facet disease changes lumbar spine with dextro convex scoliosis. IMPRESSION: RIGHT lower lobe infiltrate. Density in the RIGHT perihilar region extending into the upper and middle lobe appears improved since 2015, question postinflammatory scarring or residual atelectasis. Old granulomatous disease. Extensive atherosclerotic calcification. Moderate to large hiatal hernia. Distal colonic diverticulosis without diverticulitis changes. Nonspecific cystic lesion at pancreatic tail 15 x 13 mm ; if clinically indicated, characterization and stability of this finding can be assessed by followup MR imaging in 6 months. Electronically Signed   By: Lavonia Dana M.D.   On: 10/26/2015 17:24   Ct Abdomen Pelvis W Contrast  10/26/2015  CLINICAL DATA:  Weakness since being diagnosed with the flu last week, decreased appetite, abnormal chest radiograph question lung mass EXAM: CT CHEST, ABDOMEN, AND PELVIS WITH CONTRAST TECHNIQUE: Multidetector CT imaging of the chest, abdomen and pelvis was performed following the standard protocol during bolus administration of intravenous contrast. Sagittal and coronal MPR images reconstructed from axial data set. CONTRAST:  47mL OMNIPAQUE IOHEXOL 300 MG/ML SOLN IV. Dilute oral contrast. COMPARISON:  CT chest 10/06/2013 ; correlation chest radiograph 10/26/2015 FINDINGS: CT CHEST FINDINGS Mediastinum/Lymph Nodes: Calcified mediastinal and hilar lymph nodes. No definite thoracic adenopathy. Lungs/Pleura: Stellate biapical scarring, less prominent on RIGHT than on previous study. Central RIGHT lower lobe infiltrate with few air bronchograms consistent with pneumonia. Dependent atelectasis in the posterior lower lobes. Abnormal soft tissue density in RIGHT  perihilar region extending into stellate areas of  opacity in the RIGHT middle and RIGHT upper lobes, overall decreased since 2015, question improved atelectasis though difficult to entirely exclude tumor. No pleural effusion or pneumothorax. Musculoskeletal: Osseous demineralization with compression fractures of T6-T8 superior endplates. Vascular: Atherosclerotic calcifications aorta and coronary arteries. Aorta normal caliber. Vascular structures grossly patent on nondedicated exam. CT ABDOMEN PELVIS FINDINGS Hepatobiliary: Mild intrahepatic and extrahepatic biliary dilatation post cholecystectomy, could potentially be physiologic. Liver otherwise unremarkable. Pancreas: Small cystic lesion at pancreatic tail 15 x 13 mm image 59. Remainder of pancreas unremarkable. Spleen: Normal appearance. Small splenule at anterior margin of inferior spleen medially. Adrenals/Urinary Tract: No adrenal mass. Cyst upper pole LEFT kidney 19 x 18 mm image 60. Kidneys, ureters, and bladder otherwise unremarkable. Stomach/Bowel: Moderate to large hiatal hernia. Unable to exclude gastric wall thickening versus artifact from underdistention. Sigmoid and descending diverticulosis without evidence of diverticulitis. Bowel loops otherwise unremarkable. Normal appendix. Vascular/Lymphatic: Extensive atherosclerotic calcifications. Reproductive: N/A Other: No mass, adenopathy, free air or free fluid. Minimally increased nonspecific lymphovascular prominence within the greater omentum without ascites for additional peritoneal findings, question related to remote surgery. Musculoskeletal: Diffuse osseous demineralization. Multilevel degenerative disc and facet disease changes lumbar spine with dextro convex scoliosis. IMPRESSION: RIGHT lower lobe infiltrate. Density in the RIGHT perihilar region extending into the upper and middle lobe appears improved since 2015, question postinflammatory scarring or residual atelectasis. Old granulomatous  disease. Extensive atherosclerotic calcification. Moderate to large hiatal hernia. Distal colonic diverticulosis without diverticulitis changes. Nonspecific cystic lesion at pancreatic tail 15 x 13 mm ; if clinically indicated, characterization and stability of this finding can be assessed by followup MR imaging in 6 months. Electronically Signed   By: Lavonia Dana M.D.   On: 10/26/2015 17:24   US Abdomen Limited Ruq  10/26/2015  CLINICAL DATA:  RIGHT upper quadrant pain and tenderness for 1 day, leukocytosis, history GERD, pancreatitis, hypertension, hyperlipidemia, former smoker EXAM: US ABDOMEN LIMITED - RIGHT UPPER QUADRANT COMPARISON:  None FINDINGS: Gallbladder: Surgically absent Common bile duct: Diameter: Dilated, 12 mm diameter Liver: Normal appearance.  No intrahepatic biliary dilatation. No RIGHT upper quadrant free fluid. IMPRESSION: Dilated CBD post cholecystectomy, uncertain if physiologic or pathologic ; no intrahepatic biliary dilatation is identified. Correlation with LFTs recommended. Remainder of exam unremarkable. Electronically Signed   By: Lavonia Dana M.D.   On: 10/26/2015 15:47   ASSESSMENT AND PLAN:  Athol Pollak is a 82 y.o. male who presents with persistent malaise and weakness. Workup in the ED included CT chest abdomen and pelvis due to an initial suspicion for gallbladder or pancreatic pathology. CT chest showed right lower lobe pneumonia. Patient was recently admitted here with influenza and pneumonia.  1. HCAP (healthcare-associated pneumonia) - broad spectrum antibiotics ordered, blood and sputum cultures ordered. Patient was recently treated for pneumonia.  -Respiratory status stable. Continue antibiotics. Coronary rebound tomorrow. -No respiratory distress at present.  2. Hypertension - continue home meds, stable  3. Dementia, vascular - continue donepezil  4. GERD (gastroesophageal reflux disease) - home dose PPI  5. BPH (benign prostatic hyperplasia) -  continue doxazosin  Case discussed with Care Management/Social Worker. Management plans discussed with the patient, family and they are in agreement.  CODE STATUS: DO NOT RESUSCITATE  DVT Prophylaxis: Lovenox   TOTAL TIME TAKING CARE OF THIS PATIENT: 25* minutes.  >50% time spent on counselling and coordination of care  POSSIBLE D/C IN one to 2 DAYS, DEPENDING ON CLINICAL CONDITION.  Note: This dictation was prepared with Dragon dictation along  with smaller phrase technology. Any transcriptional errors that result from this process are unintentional.  Kaylani Fromme M.D on 10/27/2015 at 1:20 PM  Between 7am to 6pm - Pager - [phone]  After 6pm go to [email or web address] - password [email or web address] [facility]  Office  [phone]  CC: Primary care physician; Thersa Salt, DO

## 2015-10-27 NOTE — Care Management (Addendum)
Per previous RNCM note patient is from [facility] independent living and recently discharged from hospital followed by [place] [facility]. I am checking with [facility] to see if he is still open. Patient is open to [place] care for PT- Please add RN to patient care at discharge with Face-to-Face.

## 2015-10-27 NOTE — Progress Notes (Signed)
Order for enoxaparin 30 mg subcutaneously every 24 hours for DVT prophylaxis was changed to 40 mg daily per anticoagulation protocol for CrCl > 30 mL/min and BMI <40.   Lenis Noon, PharmD Clinical Pharmacist 10/27/2015 10:04 AM

## 2015-10-27 NOTE — NC FL2 (Signed)
[place] LEVEL OF CARE SCREENING TOOL     IDENTIFICATION  Patient Name: Alexander Goodwin Birthdate: 02/23/1921 Sex: male Admission Date (Current Location): 10/26/2015  [place] and [place] Number:  Engineering geologist and Address:  [facility], [address], [place], [place] [address]      Provider Number: [id number]  Attending Physician Name and Address:  Fritzi Mandes, MD  Relative Name and Phone Number:       Current Level of Care: Hospital Recommended Level of Care: [place] Prior Approval Number:    Date Approved/Denied:   PASRR Number: [id number]  Discharge Plan: SNF    Current Diagnoses: Patient Active Problem List   Diagnosis Date Noted  . Influenza with pneumonia 10/25/2015  . Hospital discharge follow-up 10/25/2015  . Sepsis ([place]) 10/16/2015  . Influenza A 10/16/2015  . HCAP (healthcare-associated pneumonia) 10/16/2015  . AKI (acute kidney injury) ([place]) 10/16/2015  . Alternating constipation and diarrhea 09/14/2015  . Dementia, vascular 06/15/2015  . Aspiration into airway 03/15/2015  . DNR (do not resuscitate) 09/15/2014  . 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 Hyperlipemia   . Hypertension   . History of colon polyps   . Arthritis   . GERD (gastroesophageal reflux disease)   . Glaucoma   . History of esophagitis   . History of TIA (transient ischemic attack)   . BPH (benign prostatic hyperplasia)     Orientation RESPIRATION BLADDER Height & Weight     Self, Time, Situation, Place  Normal Continent Weight: 160 lb (72.576 kg) Height:  5\' 6"  (167.6 cm)  BEHAVIORAL SYMPTOMS/MOOD NEUROLOGICAL BOWEL NUTRITION STATUS      Continent Diet (Heart Healthy Diet)  AMBULATORY STATUS COMMUNICATION OF NEEDS Skin   Limited Assist Verbally Normal                       Personal Care Assistance Level of Assistance  Bathing,  Feeding, Dressing Bathing Assistance: Limited assistance Feeding assistance: Independent Dressing Assistance: Limited assistance     Functional Limitations Info  Sight, Hearing, Speech Sight Info: Adequate Hearing Info: Adequate Speech Info: Adequate    SPECIAL CARE FACTORS FREQUENCY  PT (By licensed PT)     PT Frequency: 5              Contractures      Additional Factors Info  Code Status Code Status Info: DNR Allergies Info: Allergies: Clindamycin/lincomycin, Erythromycin Base           Current Medications (10/27/2015):  This is the current hospital active medication list Current Facility-Administered Medications  Medication Dose Route Frequency Provider Last Rate Last Dose  . acetaminophen (TYLENOL) tablet 650 mg  650 mg Oral Q6H PRN Lance Coon, MD       Or  . acetaminophen (TYLENOL) suppository 650 mg  650 mg Rectal Q6H PRN Lance Coon, MD      . amLODipine (NORVASC) tablet 10 mg  10 mg Oral Daily Lance Coon, MD   10 mg at 10/27/15 1037  . brimonidine (ALPHAGAN) 0.15 % ophthalmic solution 1 drop  1 drop Both Eyes TID Lance Coon, MD   1 drop at 10/26/15 2345  . brinzolamide (AZOPT) 1 % ophthalmic suspension 1 drop  1 drop Both Eyes TID Lance Coon, MD   1 drop at 10/26/15 2345  . donepezil (ARICEPT) tablet 10 mg  10 mg Oral QHS  Lance Coon, MD   10 mg at 10/26/15 2345  . doxazosin (CARDURA) tablet 4 mg  4 mg Oral Daily Lance Coon, MD   4 mg at 10/27/15 1037  . enoxaparin (LOVENOX) injection 40 mg  40 mg Subcutaneous Q24H Lenis Noon, RPH      . ondansetron [facility]) tablet 4 mg  4 mg Oral Q6H PRN Lance Coon, MD       Or  . ondansetron [facility]) injection 4 mg  4 mg Intravenous Q6H PRN Lance Coon, MD      . piperacillin-tazobactam (ZOSYN) IVPB 4.5 g  4.5 g Intravenous 3 times per day Lance Coon, MD   4.5 g at 10/27/15 [id number]  . sodium chloride flush (NS) 0.9 % injection 3 mL  3 mL Intravenous Q12H Lance Coon, MD   3 mL at 10/27/15 0138  . vancomycin  (VANCOCIN) IVPB 1000 mg/200 mL premix  1,000 mg Intravenous Q36H Lance Coon, MD         Discharge Medications: Please see discharge summary for a list of discharge medications.  Relevant Imaging Results:  Relevant Lab Results:   Additional Information SSN:  [id number]  Darden Dates, LCSW

## 2015-10-28 MED ORDER — PIPERACILLIN-TAZOBACTAM 3.375 G IVPB
3.3750 g | Freq: Three times a day (TID) | INTRAVENOUS | Status: DC
  Administered 2015-10-28 – 2015-10-29 (×2): 3.375 g via INTRAVENOUS
  Filled 2015-10-28 (×4): qty 50

## 2015-10-28 NOTE — Evaluation (Signed)
Physical Therapy Evaluation Patient Details Name: ROB NIDIFFER MRN: [id number] DOB: [birth year] Today's Date: 10/28/2015   History of Present Illness  Pt is a 82 y.o. 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  Pt presented with fever, chills, cough and confusion.  Pt was admitted for sepsis.  Pt had recent end of February admittance for hypoxia and influenza.      Clinical Impression  Prior to admission pt reported he was independent with RW and occasionally utilized [facility] for short distance.  Pt lives in an assisted living facility.  Pt was CGA for sit to stand and ambulation of 160 feet with RW.  Pt required intermittent VC's and tactile cues for body and walker placement.  Due to aforementioned function and strength deficits, pt is in need of skilled physical therapy.  It is recommended that pt is discharged to assisted living facility with home health PT when medically appropriate.     Follow Up Recommendations Home health PT;Supervision for mobility/OOB    Equipment Recommendations       Recommendations for Other Services       Precautions / Restrictions Precautions Precautions: Fall Restrictions Weight Bearing Restrictions: No      Mobility  Bed Mobility               General bed mobility comments: beginning and end of session pt was in chair.  Transfers Overall transfer level: Needs assistance Equipment used: Rolling walker (2 wheeled) Transfers: Sit to/from Stand Sit to Stand: Min guard            Ambulation/Gait Ambulation/Gait assistance: Min guard Ambulation Distance (Feet): 160 Feet Assistive device: Rolling walker (2 wheeled) Gait Pattern/deviations: Decreased step length - right;Decreased step length - left;Narrow base of support;Scissoring;Trunk flexed (scissoring during turning in tight spaces) Gait velocity: decreased   General Gait Details: Patient requires reminders to be closer to RW  during ambulation, flexed trunk secondary to posterior chain weakness. He occassionally has LEs outside BOS of RW, though is able to correct with cuing from PT. No buckling or other loss of balance noted.   Stairs            Wheelchair Mobility    Modified Rankin (Stroke Patients Only)       Balance Overall balance assessment: Needs assistance Sitting-balance support: Feet supported Sitting balance-Leahy Scale: Fair     Standing balance support: Bilateral upper extremity supported (RW) Standing balance-Leahy Scale: Fair                               Pertinent Vitals/Pain Pain Assessment: No/denies pain    Home Living Family/patient expects to be discharged to:: Assisted living               Home Equipment: Walker - 2 wheels;Cane - single point      Prior Function Level of Independence: Independent with assistive device(s)         Comments: Patient appears limited with information he can provide to PT, though it appears he ambulates with RW for household distances. Pt reported he recently had a fall.     Hand Dominance        Extremity/Trunk Assessment   Upper Extremity Assessment: Overall WFL for tasks assessed           Lower Extremity Assessment: Overall WFL for tasks assessed  At least a 3/5 for  B hip flexors, quadriceps, hamstrings, plantarflexors and dorsiflexors.      Cervical / Trunk Assessment: Normal  Communication   Communication: No difficulties  Cognition Arousal/Alertness: Awake/alert Behavior During Therapy: WFL for tasks assessed/performed Overall Cognitive Status: No family/caregiver present to determine baseline cognitive functioning (Oriented to person, not oriented to place, time or reason for admittance)                      General Comments Pt was agreeable and tolerated session well.     Exercises        Assessment/Plan    PT Assessment Patient needs continued PT services  PT Diagnosis  Generalized weakness   PT Problem List Decreased strength;Decreased range of motion;Decreased activity tolerance;Decreased balance;Decreased mobility;Decreased coordination;Decreased knowledge of use of DME  PT Treatment Interventions DME instruction;Gait training;Functional mobility training;Therapeutic activities;Therapeutic exercise;Patient/family education;Balance training   PT Goals (Current goals can be found in the Care Plan section) Acute Rehab PT Goals Patient Stated Goal: To return home  PT Goal Formulation: With patient Time For Goal Achievement: 10/31/15 Potential to Achieve Goals: Good    Frequency Min 2X/week   Barriers to discharge        Co-evaluation               End of Session Equipment Utilized During Treatment: Gait belt Activity Tolerance: Patient tolerated treatment well Patient left: in chair;with call bell/phone within reach;with chair alarm set Nurse Communication: Mobility status         Time: [id number] PT Time Calculation (min) (ACUTE ONLY): 27 min   Charges:         PT G Codes:       Mittie Bodo, SPT Mittie Bodo 10/28/2015, 2:43 PM

## 2015-10-28 NOTE — Progress Notes (Signed)
Pharmacy Antibiotic Note  Alexander Goodwin is a 82 y.o. male admitted on 10/26/2015 with pneumonia.  Pharmacy has been consulted for Zosyn dosing.  Plan: Will transition to Zosyn 3.375 grams q 8 hours.   Height: 5\' 6"  (167.6 cm) Weight: 160 lb (72.576 kg) IBW/kg (Calculated) : 63.8  Temp (24hrs), Avg:98.8 F (37.1 C), Min:98.2 F (36.8 C), Max:99 F (37.2 C)   Recent Labs Lab 10/26/15 1215 10/27/15 0106  WBC 30.7* 21.9*  CREATININE 1.42* 1.05    Estimated Creatinine Clearance: 39.7 mL/min (by C-G formula based on Cr of 1.05).    Allergies  Allergen Reactions  . Clindamycin/Lincomycin Itching  . Erythromycin Base Rash    Antimicrobials this admission:  Vanc 3/9 >> 3/9    Zosyn >>   Dose adjustments this admission:   Microbiology results:   3/8 Sputum: pending  2/26 MRSA PCR: (-)  CXR: R side infiltrate 3/8 UA: pending  Thank you for allowing pharmacy to be a part of this patient's care.  Bhakti Labella A 10/28/2015 3:07 PM

## 2015-10-28 NOTE — Progress Notes (Signed)
PT is recommending Home Health. RN Case Manager aware of above. Patient will return to [place] [facility]. Please reconsult if future social work needs arise. CSW signing off.   Blima Rich, LCSW [phone]

## 2015-10-28 NOTE — Care Management Important Message (Signed)
Important Message  Patient Details  Name: Alexander Goodwin MRN: [id number] Date of Birth: Jan 18, 1921   Medicare Important Message Given:  Yes    Juliann Pulse A Jaqueline Uber 10/28/2015, 11:18 AM

## 2015-10-28 NOTE — Progress Notes (Signed)
Per MD patient will likely be ready for D/C home back to [facility] tomorrow 10/29/15. Clinical Social Worker (CSW) made patient aware of above. CSW contacted [place] and made her aware of above. Per Cecille Rubin security at [employer] will provide transport tomorrow [phone].   Blima Rich, LCSW [phone]

## 2015-10-29 DIAGNOSIS — H401133 Primary open-angle glaucoma, bilateral, severe stage: Secondary | ICD-10-CM | POA: Diagnosis not present

## 2015-10-29 LAB — CBC
HCT: 28.6 % — ABNORMAL LOW (ref 40.0–52.0)
HEMOGLOBIN: 9.5 g/dL — AB (ref 13.0–18.0)
MCH: 32 pg (ref 26.0–34.0)
MCHC: 33.3 g/dL (ref 32.0–36.0)
MCV: 96.1 fL (ref 80.0–100.0)
PLATELETS: 214 10*3/uL (ref 150–440)
RBC: 2.98 MIL/uL — AB (ref 4.40–5.90)
RDW: 16.9 % — ABNORMAL HIGH (ref 11.5–14.5)
WBC: 10 10*3/uL (ref 3.8–10.6)

## 2015-10-29 MED ORDER — AMOXICILLIN-POT CLAVULANATE 875-125 MG PO TABS: 1.0000 | ORAL_TABLET | Freq: Two times a day (BID) | ORAL | Status: DC

## 2015-10-29 NOTE — Progress Notes (Signed)
Patient being discharged back home. His son Francee Piccolo is picking him up to take him to his eye appointment, and then back to [employer] [facility] at [place].  IV removed and belongings packed. VSS at time of discharge.

## 2015-10-29 NOTE — Care Management Note (Addendum)
Case Management Note  Patient Details  Name: Alexander Goodwin MRN: [id number] Date of Birth: 1920/12/31  Subjective/Objective:        Referral for home health PT and RN was called and faxed to [facility]. Blima Rich CSW is arranging for [facility] to transport Alexander Goodwin back to [place].            Action/Plan:   Expected Discharge Date:                  Expected Discharge Plan:     In-House Referral:     Discharge planning Services     Post Acute Care Choice:    Choice offered to:     DME Arranged:    DME Agency:     HH Arranged:    [place] Agency:     Status of Service:     Medicare Important Message Given:  Yes Date Medicare IM Given:    Medicare IM give by:    Date Additional Medicare IM Given:    Additional Medicare Important Message give by:     If discussed at [place] of Stay Meetings, dates discussed:    Additional Comments:  Alexander Goodwin A, RN 10/29/2015, 8:14 AM

## 2015-10-29 NOTE — Discharge Summary (Signed)
Alexander Goodwin NAME: Alexander Goodwin    MR#:  [id number]  DATE OF BIRTH:  05/06/1921  DATE OF ADMISSION:  10/26/2015 ADMITTING PHYSICIAN: Lance Coon, MD  DATE OF DISCHARGE: 10/29/2015  PRIMARY CARE PHYSICIAN: Coral Spikes, DO    ADMISSION DIAGNOSIS:  RLL pneumonia [J18.9]  DISCHARGE DIAGNOSIS:  Principal Problem:   HCAP (healthcare-associated pneumonia) Active Problems:   GERD (gastroesophageal reflux disease)   BPH (benign prostatic hyperplasia)   Hyperlipemia   Hypertension   Dementia, vascular   SECONDARY DIAGNOSIS:   Past Medical History  Diagnosis Date  . Diverticulitis of colon   . History of colon polyps   . Arthritis     right knee, hips, neck. He denies any major limitation in activities  . GERD (gastroesophageal reflux disease)     well controlled with medication. No nocturnal symptoms on a regular basis  . Glaucoma     both eyes, followed closely by Dr. Janyth Contes  . Esophagitis     Last EGD Fall '11   . Abscess of lung without pneumonia ([place])     Right upper lobe chronic lung abscess and prior left upper lobe lung abscess due to chronic aspiration  . Pancreatitis     resolved after GB surgery  . Stroke [facility])     TIAs - age 40  . BPH (benign prostatic hypertrophy) with urinary obstruction   . Hyperlipemia   . Hypertension   . Seasonal allergies   . Dysphagia, oropharyngeal   . Dementia     HOSPITAL COURSE:   Alexander Goodwin is a 82 y.o. male who presents with persistent malaise and weakness. CT chest showed right lower lobe pneumonia.   1. HCAP (healthcare-associated pneumonia) - Recent admission for flu, now with RLL pneumonia - was on zosyn in hospital, blood cultures negative- discharge on augmentin - afebrile and feels much better. Still has some weakness. -Respiratory status stable. Remains on room air - discharge to ALF with home health today  2. Hypertension - continue home meds,  stable- on norvasc  3. Dementia, vascular - continue donepezil  4. GERD (gastroesophageal reflux disease) - sucralfate and PPI  5. BPH (benign prostatic hyperplasia) - continue doxazosin  Discharge today.  DISCHARGE CONDITIONS:   Stable  CONSULTS OBTAINED:   None  DRUG ALLERGIES:   Allergies  Allergen Reactions  . Clindamycin/Lincomycin Itching  . Erythromycin Base Rash    DISCHARGE MEDICATIONS:   Current Discharge Medication List    START taking these medications   Details  amoxicillin-clavulanate (AUGMENTIN) 875-125 MG tablet Take 1 tablet by mouth 2 (two) times daily. X 7 days Qty: 14 tablet, Refills: 0      CONTINUE these medications which have NOT CHANGED   Details  acetaminophen (TYLENOL ARTHRITIS PAIN) 650 MG CR tablet Take 650 mg by mouth every 8 (eight) hours as needed for pain.     acidophilus (RISAQUAD) CAPS capsule Take 1 capsule by mouth daily.    amLODipine (NORVASC) 10 MG tablet Take 10 mg by mouth daily.      beta carotene w/minerals (OCUVITE) tablet Take 1 tablet by mouth daily.    brimonidine (ALPHAGAN P) 0.1 % SOLN Place 1 drop into both eyes 3 (three) times daily.     brinzolamide (AZOPT) 1 % ophthalmic suspension Place 1 drop into both eyes 3 (three) times daily.     calcium carbonate (TUMS - DOSED IN MG ELEMENTAL CALCIUM) 500 MG chewable tablet  Chew 1 tablet by mouth as needed for indigestion or heartburn.    calcium-vitamin D (OSCAL 500/200 D-3) 500-200 MG-UNIT per tablet Take 1 tablet by mouth daily.     donepezil (ARICEPT) 10 MG tablet Take 10 mg by mouth at bedtime.    doxazosin (CARDURA) 4 MG tablet Take 4 mg by mouth at bedtime.     gabapentin (NEURONTIN) 300 MG capsule Take 300 mg by mouth 2 (two) times daily.     Iron, Ferrous Gluconate, 256 (28 FE) MG TABS Take 1 tablet by mouth daily.    latanoprost (XALATAN) 0.005 % ophthalmic solution Place 1 drop into both eyes at bedtime.     omeprazole (PRILOSEC) 20 MG capsule Take  20 mg by mouth 2 (two) times daily.    sucralfate (CARAFATE) 1 G tablet Take 1 tablet by mouth 2 (two) times daily before a meal. Refills: 0         DISCHARGE INSTRUCTIONS:   1. PCP f/u in 1-2 weeks 2. Home health  If you experience worsening of your admission symptoms, develop shortness of breath, life threatening emergency, suicidal or homicidal thoughts you must seek medical attention immediately by calling 911 or calling your MD immediately  if symptoms less severe.  You Must read complete instructions/literature along with all the possible adverse reactions/side effects for all the Medicines you take and that have been prescribed to you. Take any new Medicines after you have completely understood and accept all the possible adverse reactions/side effects.   Please note  You were cared for by a hospitalist during your hospital stay. If you have any questions about your discharge medications or the care you received while you were in the hospital after you are discharged, you can call the unit and asked to speak with the hospitalist on call if the hospitalist that took care of you is not available. Once you are discharged, your primary care physician will handle any further medical issues. Please note that NO REFILLS for any discharge medications will be authorized once you are discharged, as it is imperative that you return to your primary care physician (or establish a relationship with a primary care physician if you do not have one) for your aftercare needs so that they can reassess your need for medications and monitor your lab values.    Today   CHIEF COMPLAINT:   Chief Complaint  Patient presents with  . Weakness    VITAL SIGNS:  Blood pressure 124/66, pulse 59, temperature 97.9 F (36.6 C), temperature source Oral, resp. rate 16, height 5\' 6"  (1.676 m), weight 72.576 kg (160 lb), SpO2 96 %.  I/O:   Intake/Output Summary (Last 24 hours) at 10/29/15 0719 Last data  filed at 10/29/15 0327  Gross per 24 hour  Intake    340 ml  Output   1400 ml  Net  -1060 ml    PHYSICAL EXAMINATION:   Physical Exam  GENERAL:  82 y.o.-year-old patient lying in the bed with no acute distress.  EYES: Pupils equal, round, reactive to light and accommodation. Post surgical pupils. No scleral icterus. Extraocular muscles intact.  HEENT: Head atraumatic, normocephalic. Oropharynx and nasopharynx clear.  NECK:  Supple, no jugular venous distention. No thyroid enlargement, no tenderness.  LUNGS: Normal breath sounds bilaterally, except slightly decreased at the right base. no wheezing, rales,rhonchi or crepitation. No use of accessory muscles of respiration.  CARDIOVASCULAR: S1, S2 normal. No rubs, or gallops. 3/6 systolic murmur present. ABDOMEN: Soft, non-tender, non-distended.  Bowel sounds present. No organomegaly or mass.  EXTREMITIES: No pedal edema, cyanosis, or clubbing.  NEUROLOGIC: Cranial nerves II through XII are intact. Muscle strength 5/5 in all extremities. Sensation intact. Gait not checked.  PSYCHIATRIC: The patient is alert and oriented x 2.  SKIN: No obvious rash, lesion, or ulcer.   DATA REVIEW:   CBC  Recent Labs Lab 10/29/15 0318  WBC 10.0  HGB 9.5*  HCT 28.6*  PLT 214    Chemistries   Recent Labs Lab 10/26/15 1215 10/27/15 0106  NA 134*  --   K 3.9  --   CL 101  --   CO2 26  --   GLUCOSE 119*  --   BUN 25*  --   CREATININE 1.42* 1.05  CALCIUM 8.8*  --   AST 37  --   ALT 105*  --   ALKPHOS 86  --   BILITOT 1.3*  --     Cardiac Enzymes No results for input(s): TROPONINI in the last 168 hours.  Microbiology Results  Results for orders placed or performed during the hospital encounter of 10/26/15  MRSA PCR Screening     Status: None   Collection Time: 10/27/15  2:38 PM  Result Value Ref Range Status   MRSA by PCR NEGATIVE NEGATIVE Final    Comment:        The GeneXpert MRSA Assay (FDA approved for NASAL specimens only),  is one component of a comprehensive MRSA colonization surveillance program. It is not intended to diagnose MRSA infection nor to guide or monitor treatment for MRSA infections.     RADIOLOGY:  No results found.  EKG:   Orders placed or performed during the hospital encounter of 10/26/15  . ED EKG  . ED EKG  . EKG 12-Lead  . EKG 12-Lead      Management plans discussed with the patient, family and they are in agreement.  CODE STATUS:     Code Status Orders        Start     Ordered   10/26/15 2343  Do not attempt resuscitation (DNR)   Continuous    Question Answer Comment  In the event of cardiac or respiratory ARREST Do not call a "code blue"   In the event of cardiac or respiratory ARREST Do not perform Intubation, CPR, defibrillation or ACLS   In the event of cardiac or respiratory ARREST Use medication by any route, position, wound care, and other measures to relive pain and suffering. May use oxygen, suction and manual treatment of airway obstruction as needed for comfort.      10/26/15 2342    Code Status History    Date Active Date Inactive Code Status Order ID Comments User Context   10/16/2015  2:44 AM 10/19/2015  6:09 PM DNR [id number]  Lance Coon, MD ED   02/06/2015 10:41 PM 02/07/2015  6:08 PM DNR [id number]  Lytle Butte, MD ED    Advance Directive Documentation        Most Recent Value   Type of Advance Directive  Living will, Out of facility DNR (pink MOST or yellow form), Healthcare Power of Attorney   Pre-existing out of facility DNR order (yellow form or pink MOST form)  Yellow form placed in chart (order not valid for inpatient use)   "MOST" Form in Place?        TOTAL TIME TAKING CARE OF THIS PATIENT: 36 minutes.    Gladstone Lighter M.D on 10/29/2015 at 7:19  AM  Between 7am to 6pm - Pager - [phone]  After 6pm go to [email or web address] - password [email or web address] [facility]  Office  [phone]  CC: Primary care physician;  Coral Spikes, DO

## 2015-10-29 NOTE — Progress Notes (Addendum)
Patient is medically stable for D/C back to independent living at [facility] today. Clinical Education officer, museum (CSW) contacted Security at [facility] this morning. Per Animal nutritionist he can transport patient today. CSW gave RN security's phone numbers [phone] or [phone]. Per RN she is going to call patient's son Hoy Morn to see if he can transport patient to his eye appointment at [facility] today. Appointment is at 9:50 am. If RN cannot get in touch with Hoy Morn or Hoy Morn cannot transport patient will have to reschedule his eye appoitment and [facility] will transport today. RN Case Manager has arranged resumption of home health. Please reconsult if future social work needs arise. CSW signing off.    Per RN patient's son Francee Piccolo will transport patient today. CSW Mud[facility] at [place].   Blima Rich, LCSW [phone]

## 2015-10-30 NOTE — Progress Notes (Signed)
Patient's son called and asked if we found patient's glasses. Nurse looked through trash, linen, drawers, and under bed. Looked on admission and no belongings were noted. Advised son of results of out room check.

## 2015-10-31 ENCOUNTER — Telehealth: Payer: Self-pay

## 2015-10-31 NOTE — Telephone Encounter (Signed)
Transition Care Management Follow-up Telephone Call   Date discharged? 10/29/15   How have you been since you were released from the hospital? I'm doing fine.  No problems.  No coughing.  Eating/drinking ok.  No pain.   Do you understand why you were in the hospital? Yes, pneumonia.   Do you understand the discharge instructions? Yes, and I am taking it easy.  I am moving slowly between activities.   Where were you discharged to? Home.   Items Reviewed:  Medications reviewed: Yes, taking all home scheduled medications without issues.  Allergies reviewed: Yes, no changes.  Dietary changes reviewed: Yes, no problems.  Referrals reviewed: Yes, appointment made with PCP.   Functional Questionnaire:   Activities of Daily Living (ADLs):   He states they are independent in the following: Independent in all ADLs at this time. States they require assistance with the following: Does not require assistance at this time.   Any transportation issues/concerns?: No   Any patient concerns? None at this time.   Confirmed importance and date/time of follow-up visits scheduled Yes, appointment scheduled 11/04/15 at 11:00.  Provider Appointment booked with Dr. Lacinda Axon (PCP).  Confirmed with patient if condition begins to worsen call PCP or go to the ER.  Patient was given the office number and encouraged to call back with question or concerns.  : Yes, patient  verbalized understanding.

## 2015-11-01 DIAGNOSIS — F0150 Vascular dementia without behavioral disturbance: Secondary | ICD-10-CM | POA: Diagnosis not present

## 2015-11-01 DIAGNOSIS — K219 Gastro-esophageal reflux disease without esophagitis: Secondary | ICD-10-CM | POA: Diagnosis not present

## 2015-11-01 DIAGNOSIS — Z8673 Personal history of transient ischemic attack (TIA), and cerebral infarction without residual deficits: Secondary | ICD-10-CM | POA: Diagnosis not present

## 2015-11-01 DIAGNOSIS — I10 Essential (primary) hypertension: Secondary | ICD-10-CM | POA: Diagnosis not present

## 2015-11-01 DIAGNOSIS — Z87891 Personal history of nicotine dependence: Secondary | ICD-10-CM | POA: Diagnosis not present

## 2015-11-01 DIAGNOSIS — E785 Hyperlipidemia, unspecified: Secondary | ICD-10-CM | POA: Diagnosis not present

## 2015-11-01 DIAGNOSIS — H409 Unspecified glaucoma: Secondary | ICD-10-CM | POA: Diagnosis not present

## 2015-11-01 DIAGNOSIS — J181 Lobar pneumonia, unspecified organism: Secondary | ICD-10-CM | POA: Diagnosis not present

## 2015-11-01 DIAGNOSIS — N400 Enlarged prostate without lower urinary tract symptoms: Secondary | ICD-10-CM | POA: Diagnosis not present

## 2015-11-02 ENCOUNTER — Telehealth: Payer: Self-pay | Admitting: Family Medicine

## 2015-11-02 ENCOUNTER — Ambulatory Visit: Payer: Medicare Other | Admitting: Family Medicine

## 2015-11-02 DIAGNOSIS — F0150 Vascular dementia without behavioral disturbance: Secondary | ICD-10-CM | POA: Diagnosis not present

## 2015-11-02 DIAGNOSIS — N400 Enlarged prostate without lower urinary tract symptoms: Secondary | ICD-10-CM | POA: Diagnosis not present

## 2015-11-02 DIAGNOSIS — H409 Unspecified glaucoma: Secondary | ICD-10-CM | POA: Diagnosis not present

## 2015-11-02 DIAGNOSIS — I10 Essential (primary) hypertension: Secondary | ICD-10-CM | POA: Diagnosis not present

## 2015-11-02 DIAGNOSIS — K219 Gastro-esophageal reflux disease without esophagitis: Secondary | ICD-10-CM | POA: Diagnosis not present

## 2015-11-02 DIAGNOSIS — J181 Lobar pneumonia, unspecified organism: Secondary | ICD-10-CM | POA: Diagnosis not present

## 2015-11-02 NOTE — Telephone Encounter (Signed)
Pt Alexander Goodwin [phone] Physical therapist from [facility]  would like a copy of pt medication list fax to [phone]. Thank you!

## 2015-11-02 NOTE — Telephone Encounter (Signed)
Pt med list faxed

## 2015-11-04 ENCOUNTER — Encounter: Payer: Self-pay | Admitting: Family Medicine

## 2015-11-04 ENCOUNTER — Ambulatory Visit (INDEPENDENT_AMBULATORY_CARE_PROVIDER_SITE_OTHER): Payer: Medicare Other | Admitting: Family Medicine

## 2015-11-04 VITALS — BP 112/66 | HR 69 | Temp 97.8°F | Ht 69.0 in | Wt 162.0 lb

## 2015-11-04 DIAGNOSIS — I10 Essential (primary) hypertension: Secondary | ICD-10-CM | POA: Diagnosis not present

## 2015-11-04 DIAGNOSIS — J189 Pneumonia, unspecified organism: Secondary | ICD-10-CM | POA: Diagnosis not present

## 2015-11-04 DIAGNOSIS — K219 Gastro-esophageal reflux disease without esophagitis: Secondary | ICD-10-CM | POA: Diagnosis not present

## 2015-11-04 DIAGNOSIS — N400 Enlarged prostate without lower urinary tract symptoms: Secondary | ICD-10-CM | POA: Diagnosis not present

## 2015-11-04 DIAGNOSIS — H409 Unspecified glaucoma: Secondary | ICD-10-CM | POA: Diagnosis not present

## 2015-11-04 DIAGNOSIS — D649 Anemia, unspecified: Secondary | ICD-10-CM

## 2015-11-04 DIAGNOSIS — Z09 Encounter for follow-up examination after completed treatment for conditions other than malignant neoplasm: Secondary | ICD-10-CM

## 2015-11-04 DIAGNOSIS — F0150 Vascular dementia without behavioral disturbance: Secondary | ICD-10-CM | POA: Diagnosis not present

## 2015-11-04 DIAGNOSIS — J181 Lobar pneumonia, unspecified organism: Secondary | ICD-10-CM | POA: Diagnosis not present

## 2015-11-04 LAB — COMPREHENSIVE METABOLIC PANEL
ALBUMIN: 3.3 g/dL — AB (ref 3.5–5.2)
ALT: 19 U/L (ref 0–53)
AST: 18 U/L (ref 0–37)
Alkaline Phosphatase: 77 U/L (ref 39–117)
BUN: 20 mg/dL (ref 6–23)
CALCIUM: 9.2 mg/dL (ref 8.4–10.5)
CHLORIDE: 104 meq/L (ref 96–112)
CO2: 27 mEq/L (ref 19–32)
Creatinine, Ser: 1.17 mg/dL (ref 0.40–1.50)
GFR: 61.81 mL/min (ref >60.00)
Glucose, Bld: 92 mg/dL (ref 70–99)
POTASSIUM: 4.3 meq/L (ref 3.5–5.1)
Sodium: 137 mEq/L (ref 135–145)
Total Bilirubin: 0.4 mg/dL (ref 0.2–1.2)
Total Protein: 6.3 g/dL (ref 6.0–8.3)

## 2015-11-04 LAB — CBC
HEMATOCRIT: 28.5 % — AB (ref 39.0–52.0)
Hemoglobin: 9.4 g/dL — ABNORMAL LOW (ref 13.0–17.0)
MCHC: 32.8 g/dL (ref 30.0–36.0)
MCV: 96.5 fl (ref 78.0–100.0)
PLATELETS: 211 10*3/uL (ref 150.0–400.0)
RBC: 2.96 Mil/uL — AB (ref 4.22–5.81)
RDW: 17.5 % — ABNORMAL HIGH (ref 11.5–15.5)
WBC: 6.2 10*3/uL (ref 4.0–10.5)

## 2015-11-04 LAB — IRON: IRON: 91 ug/dL (ref 42–165)

## 2015-11-04 LAB — FERRITIN: FERRITIN: 61.3 ng/mL (ref 22.0–322.0)

## 2015-11-04 NOTE — Patient Instructions (Signed)
Continue your current medications.  I'm glad that your feeling better.  Take care  Follow up in 3 months or sooner if needed.  Dr. Lacinda Axon

## 2015-11-05 LAB — IRON AND TIBC
%SAT: 32 % (ref 15–60)
Iron: 90 ug/dL (ref 50–180)
TIBC: 279 ug/dL (ref 250–425)
UIBC: 189 ug/dL (ref 125–400)

## 2015-11-05 NOTE — Assessment & Plan Note (Signed)
Recently admitted. Doing well and has finished antibiotic course.

## 2015-11-05 NOTE — Progress Notes (Signed)
Subjective:  Patient ID: Alexander Goodwin, male    DOB: 07/20/1921  Age: 82 y.o. MRN: [id number]  CC: Hospital follow up  HPI:  82 year old male presents for hospital follow up.  Patient was recently admitted from 2/26 - 3/1 for sepsis secondary to influenza and pneumonia.  Patient was seen in our office for follow up and then subsequently developed malaise/weakness. He once again present to the hospital (3/8).  Workup in the ED revealed HCAP and a WBC count of 30.  Patient was placed on broad spectrum antibiotics. He did well during admission and was discharged home on Augmentin.  He presents today for follow up after his hospitalization.  He is in good spirits today. He has completed his course of antibiotics. No reports of shortness of breath. No recent fevers or chills. He states that he is doing well.   Social Hx   Social History   Social History  . Marital Status: Married    Spouse Name: N/A  . Number of Children: 3  . Years of Education: HSG   Occupational History  . 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 Smoking status: Former Smoker -- 0.50 packs/day for 30 years    Types: Pipe, Cigars    Quit date: 08/20/1978  . Smokeless tobacco: Former Systems developer    Types: Chew    Quit date: 11/16/1990  . Alcohol Use: No  . Drug Use: No  . Sexual Activity: Yes   Other Topics Concern  . None   Social History Narrative   HSG.   Married 1948. 3 sons - '52, '57,'58- 5 grandchildren, one deceased at 54 - OD.  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 Marriage in good health. End of Life Care -DNR/DNI,  No prolonged heroic or futile measures.     Review of Systems  Constitutional: Negative for fever.  Respiratory: Negative for cough and shortness of breath.    Objective:  BP 112/66 mmHg  Pulse 69  Temp(Src) 97.8 F (36.6 C) (Oral)  Ht 5' 9"  (1.753 m)  Wt 162 lb (73.483 kg)  BMI 23.91 kg/m2  SpO2  97%  BP/Weight 11/04/2015 3/61/4431 12/21/84  Systolic BP 761 950 -  Diastolic BP 66 64 -  Wt. (Lbs) 162 - 160  BMI 23.91 - 25.84   Physical Exam  Constitutional:  Elderly male in NAD.  HENT:  Head: Normocephalic and atraumatic.  Eyes: Conjunctivae are normal.  Cardiovascular: Normal rate, regular rhythm and normal heart sounds.   Pulmonary/Chest: Effort normal and breath sounds normal.  Abdominal: He exhibits no distension.  Neurological: He is alert.  Psychiatric: He has a normal mood and affect.  Vitals reviewed.  Lab Results  Component Value Date   WBC 6.2 11/04/2015   HGB 9.4* 11/04/2015   HCT 28.5* 11/04/2015   PLT 211.0 11/04/2015   GLUCOSE 92 11/04/2015   CHOL 143 10/07/2013   TRIG 79 10/07/2013   HDL 44 10/07/2013   LDLCALC 83 10/07/2013   ALT 19 11/04/2015   AST 18 11/04/2015   NA 137 11/04/2015   K 4.3 11/04/2015   CL 104 11/04/2015   CREATININE 1.17 11/04/2015   BUN 20 11/04/2015   CO2 27 11/04/2015   INR 1.06 02/06/2015    Assessment & Plan:   Problem List Items Addressed This Visit    Hypertension   Relevant Orders   Comp  Met (CMET) (Completed)   Hospital discharge follow-up    Hospital course reviewed and summarized in HPI. Medications reviewed. All questions answered. Patient doing well and has completed antibiotic therapy for HCAP.      HCAP (healthcare-associated pneumonia)    Recently admitted. Doing well and has finished antibiotic course.       Other Visit Diagnoses    Anemia, unspecified anemia type    -  Primary    Relevant Orders    CBC (Completed)    Ferritin (Completed)    Iron (Completed)    Iron Binding Cap (TIBC) (Completed)      Follow-up: 3 months.  [place]

## 2015-11-06 NOTE — Assessment & Plan Note (Signed)
Hospital course reviewed and summarized in HPI. Medications reviewed. All questions answered. Patient doing well and has completed antibiotic therapy for HCAP.

## 2015-11-07 DIAGNOSIS — F0150 Vascular dementia without behavioral disturbance: Secondary | ICD-10-CM | POA: Diagnosis not present

## 2015-11-07 DIAGNOSIS — J181 Lobar pneumonia, unspecified organism: Secondary | ICD-10-CM | POA: Diagnosis not present

## 2015-11-07 DIAGNOSIS — I10 Essential (primary) hypertension: Secondary | ICD-10-CM | POA: Diagnosis not present

## 2015-11-07 DIAGNOSIS — K219 Gastro-esophageal reflux disease without esophagitis: Secondary | ICD-10-CM | POA: Diagnosis not present

## 2015-11-07 DIAGNOSIS — H409 Unspecified glaucoma: Secondary | ICD-10-CM | POA: Diagnosis not present

## 2015-11-07 DIAGNOSIS — N400 Enlarged prostate without lower urinary tract symptoms: Secondary | ICD-10-CM | POA: Diagnosis not present

## 2015-11-09 DIAGNOSIS — H409 Unspecified glaucoma: Secondary | ICD-10-CM | POA: Diagnosis not present

## 2015-11-09 DIAGNOSIS — K219 Gastro-esophageal reflux disease without esophagitis: Secondary | ICD-10-CM | POA: Diagnosis not present

## 2015-11-09 DIAGNOSIS — I10 Essential (primary) hypertension: Secondary | ICD-10-CM | POA: Diagnosis not present

## 2015-11-09 DIAGNOSIS — J181 Lobar pneumonia, unspecified organism: Secondary | ICD-10-CM | POA: Diagnosis not present

## 2015-11-09 DIAGNOSIS — F0150 Vascular dementia without behavioral disturbance: Secondary | ICD-10-CM | POA: Diagnosis not present

## 2015-11-09 DIAGNOSIS — N400 Enlarged prostate without lower urinary tract symptoms: Secondary | ICD-10-CM | POA: Diagnosis not present

## 2015-11-11 DIAGNOSIS — K219 Gastro-esophageal reflux disease without esophagitis: Secondary | ICD-10-CM | POA: Diagnosis not present

## 2015-11-11 DIAGNOSIS — N400 Enlarged prostate without lower urinary tract symptoms: Secondary | ICD-10-CM | POA: Diagnosis not present

## 2015-11-11 DIAGNOSIS — F0150 Vascular dementia without behavioral disturbance: Secondary | ICD-10-CM | POA: Diagnosis not present

## 2015-11-11 DIAGNOSIS — I10 Essential (primary) hypertension: Secondary | ICD-10-CM | POA: Diagnosis not present

## 2015-11-11 DIAGNOSIS — H409 Unspecified glaucoma: Secondary | ICD-10-CM | POA: Diagnosis not present

## 2015-11-11 DIAGNOSIS — J181 Lobar pneumonia, unspecified organism: Secondary | ICD-10-CM | POA: Diagnosis not present

## 2015-11-14 DIAGNOSIS — K219 Gastro-esophageal reflux disease without esophagitis: Secondary | ICD-10-CM | POA: Diagnosis not present

## 2015-11-14 DIAGNOSIS — I10 Essential (primary) hypertension: Secondary | ICD-10-CM | POA: Diagnosis not present

## 2015-11-14 DIAGNOSIS — N400 Enlarged prostate without lower urinary tract symptoms: Secondary | ICD-10-CM | POA: Diagnosis not present

## 2015-11-14 DIAGNOSIS — J181 Lobar pneumonia, unspecified organism: Secondary | ICD-10-CM | POA: Diagnosis not present

## 2015-11-14 DIAGNOSIS — F0150 Vascular dementia without behavioral disturbance: Secondary | ICD-10-CM | POA: Diagnosis not present

## 2015-11-14 DIAGNOSIS — H409 Unspecified glaucoma: Secondary | ICD-10-CM | POA: Diagnosis not present

## 2015-11-17 DIAGNOSIS — I10 Essential (primary) hypertension: Secondary | ICD-10-CM | POA: Diagnosis not present

## 2015-11-17 DIAGNOSIS — H409 Unspecified glaucoma: Secondary | ICD-10-CM | POA: Diagnosis not present

## 2015-11-17 DIAGNOSIS — N400 Enlarged prostate without lower urinary tract symptoms: Secondary | ICD-10-CM | POA: Diagnosis not present

## 2015-11-17 DIAGNOSIS — F0150 Vascular dementia without behavioral disturbance: Secondary | ICD-10-CM | POA: Diagnosis not present

## 2015-11-17 DIAGNOSIS — K219 Gastro-esophageal reflux disease without esophagitis: Secondary | ICD-10-CM | POA: Diagnosis not present

## 2015-11-17 DIAGNOSIS — J181 Lobar pneumonia, unspecified organism: Secondary | ICD-10-CM | POA: Diagnosis not present

## 2015-11-21 DIAGNOSIS — I10 Essential (primary) hypertension: Secondary | ICD-10-CM | POA: Diagnosis not present

## 2015-11-21 DIAGNOSIS — J181 Lobar pneumonia, unspecified organism: Secondary | ICD-10-CM | POA: Diagnosis not present

## 2015-11-21 DIAGNOSIS — K219 Gastro-esophageal reflux disease without esophagitis: Secondary | ICD-10-CM | POA: Diagnosis not present

## 2015-11-21 DIAGNOSIS — N400 Enlarged prostate without lower urinary tract symptoms: Secondary | ICD-10-CM | POA: Diagnosis not present

## 2015-11-21 DIAGNOSIS — F0150 Vascular dementia without behavioral disturbance: Secondary | ICD-10-CM | POA: Diagnosis not present

## 2015-11-21 DIAGNOSIS — H409 Unspecified glaucoma: Secondary | ICD-10-CM | POA: Diagnosis not present

## 2015-11-24 DIAGNOSIS — F0150 Vascular dementia without behavioral disturbance: Secondary | ICD-10-CM | POA: Diagnosis not present

## 2015-11-24 DIAGNOSIS — H409 Unspecified glaucoma: Secondary | ICD-10-CM | POA: Diagnosis not present

## 2015-11-24 DIAGNOSIS — N400 Enlarged prostate without lower urinary tract symptoms: Secondary | ICD-10-CM | POA: Diagnosis not present

## 2015-11-24 DIAGNOSIS — I10 Essential (primary) hypertension: Secondary | ICD-10-CM | POA: Diagnosis not present

## 2015-11-24 DIAGNOSIS — J181 Lobar pneumonia, unspecified organism: Secondary | ICD-10-CM | POA: Diagnosis not present

## 2015-11-24 DIAGNOSIS — K219 Gastro-esophageal reflux disease without esophagitis: Secondary | ICD-10-CM | POA: Diagnosis not present

## 2015-12-02 ENCOUNTER — Other Ambulatory Visit: Payer: Self-pay | Admitting: Family Medicine

## 2015-12-03 DIAGNOSIS — R300 Dysuria: Secondary | ICD-10-CM | POA: Diagnosis not present

## 2015-12-05 NOTE — Telephone Encounter (Signed)
historical medication. please advise?

## 2015-12-27 ENCOUNTER — Other Ambulatory Visit: Payer: Self-pay | Admitting: Family Medicine

## 2015-12-27 NOTE — Telephone Encounter (Signed)
Historical medication. Please advise? 

## 2016-01-12 ENCOUNTER — Encounter: Payer: Self-pay | Admitting: Internal Medicine

## 2016-01-12 ENCOUNTER — Ambulatory Visit (INDEPENDENT_AMBULATORY_CARE_PROVIDER_SITE_OTHER): Payer: Medicare Other | Admitting: Internal Medicine

## 2016-01-12 VITALS — BP 142/70 | HR 72 | Ht 68.0 in | Wt 162.0 lb

## 2016-01-12 DIAGNOSIS — J189 Pneumonia, unspecified organism: Secondary | ICD-10-CM | POA: Diagnosis not present

## 2016-01-12 DIAGNOSIS — J984 Other disorders of lung: Secondary | ICD-10-CM | POA: Diagnosis not present

## 2016-01-12 NOTE — Progress Notes (Signed)
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 --Discussed sticking with his dysphagia diet, avoiding fried foods, not eating close to bedtime.   -Cavitary lung disease.  --As this appears stable to further interventions are needed at this time.  -No antibiotics are necessary at this time.  DNR (do not resuscitate) Pt reaffirmed DNR status, today in the presence of his wife. He maintains that he would not want to be put on any form of artificial life support, he would not want to be resuscitated, and would not want to be on the ventilator.    Date: 01/12/2016  MRN# [id number] Alexander Goodwin June 13, 1921   Alexander Goodwin is a 82 y.o. old male seen in follow up for chief complaint of  Chief Complaint  Patient presents with  . Follow-up    HPI:  He feels that he has been doing OK, he notes that he has occasional cough. He feels that his reflux is controlled. His wife is present and gives much of the history. She notes that when he eats he usually begins coughing after a meal. He does not feel that his breathing is difficult.     Review of testing: Chest x-ray 3/8. In comparison with previous film from 10/16/15: Minimally changed Hyperinflation, scarring in the right lung, on balance it appears very similar. Review of CT of the chest 10/26/15: In comparison with previous film from 10/06/13, the right middle lobe changes/scarring appeared to be improved, there is a new area of infiltrate in the right lower lobe, as well as in the left lower lobe, the previously seen. Hiatal hernia. Has grown in size. On balance changes appear incremental.    Medication:   Outpatient Encounter Prescriptions as of 01/12/2016  Medication Sig  . acetaminophen (TYLENOL ARTHRITIS PAIN) 650 MG CR tablet  Take 650 mg by mouth every 8 (eight) hours as needed for pain.   Marland Kitchen acidophilus (RISAQUAD) CAPS capsule Take 1 capsule by mouth daily.  Marland Kitchen amLODipine (NORVASC) 10 MG tablet Take 10 mg by mouth daily.    . beta carotene w/minerals (OCUVITE) tablet Take 1 tablet by mouth daily.  . brimonidine (ALPHAGAN P) 0.1 % SOLN Place 1 drop into both eyes 3 (three) times daily.   . brinzolamide (AZOPT) 1 % ophthalmic suspension Place 1 drop into both eyes 3 (three) times daily.   . calcium carbonate (TUMS - DOSED IN MG ELEMENTAL CALCIUM) 500 MG chewable tablet Chew 1 tablet by mouth as needed for indigestion or heartburn.  . calcium-vitamin D (OSCAL 500/200 D-3) 500-200 MG-UNIT per tablet Take 1 tablet by mouth daily.   Marland Kitchen donepezil (ARICEPT) 10 MG tablet Take 10 mg by mouth at bedtime.  . donepezil (ARICEPT) 5 MG tablet TAKE ONE TABLET AT BEDTIME. MAY INCREASETO TWO TABLETS AT BEDTIME AFTER FOUR TO SIX WEEKS  . doxazosin (CARDURA) 4 MG tablet Take 4 mg by mouth at bedtime.   . gabapentin (NEURONTIN) 300 MG capsule TAKE ONE CAPSULE TWICE A DAY  . Iron, Ferrous Gluconate, 256 (28 FE) MG TABS Take 1 tablet by mouth daily.  Marland Kitchen latanoprost (XALATAN) 0.005 % ophthalmic solution Place 1 drop into both eyes at bedtime.   Marland Kitchen omeprazole (PRILOSEC) 20 MG capsule Take 20 mg by mouth 2 (two) times daily.  . sucralfate (CARAFATE) 1 G tablet  Take 1 tablet by mouth 2 (two) times daily before a meal.   No facility-administered encounter medications on file as of 01/12/2016.     Allergies:  Clindamycin/lincomycin and Erythromycin base  Review of Systems: Gen:  Denies  fever, sweats. HEENT: Denies blurred vision. Cvc:  No dizziness, chest pain or heaviness Resp:   Denies cough or sputum porduction. Gi: Denies swallowing difficulty, stomach pain. constipation, bowel incontinence Gu:  Denies bladder incontinence, burning urine Ext:   No Joint pain, stiffness. Skin: No skin rash, easy bruising. Endoc:  No polyuria,  polydipsia. Psych: No depression, insomnia. Other:  All other systems were reviewed and found to be negative other than what is mentioned in the HPI.   Physical Examination:   VS: BP 142/70 mmHg  Pulse 72  Ht 5\' 8"  (1.727 m)  Wt 162 lb (73.483 kg)  BMI 24.64 kg/m2  SpO2 98%  General Appearance: No distress  Neuro:without focal findings,  speech normal,  HEENT: PERRLA, EOM intact. Pulmonary: normal breath sounds, No wheezing.   CardiovascularNormal S1,S2.  No m/r/g.   Abdomen: Benign, Soft, non-tender. Renal:  No costovertebral tenderness  GU:  Not performed at this time. Endoc: No evident thyromegaly, no signs of acromegaly. Skin:   warm, no rash. Extremities: normal, no cyanosis, clubbing.   LABORATORY PANEL:   CBC No results for input(s): WBC, HGB, HCT, PLT in the last 168 hours. ------------------------------------------------------------------------------------------------------------------  Chemistries  No results for input(s): NA, K, CL, CO2, GLUCOSE, BUN, CREATININE, CALCIUM, MG, AST, ALT, ALKPHOS, BILITOT in the last 168 hours.  Invalid input(s): GFRCGP ------------------------------------------------------------------------------------------------------------------  Cardiac Enzymes No results for input(s): TROPONINI in the last 168 hours. ------------------------------------------------------------  RADIOLOGY:   No results found for this or any previous visit. Results for orders placed during the hospital encounter of 09/30/14  DG Chest 2 View   Narrative CLINICAL DATA:  Subsequent evaluation of pneumonia  EXAM: CHEST  2 VIEW  COMPARISON:  09/15/2014  FINDINGS: Mild cardiac enlargement stable. Calcified right mediastinal lymph node stable. Uncoiling of the aorta stable.  Stable band of opacity right mid lung zone. Stable from 09/15/2014 and decreased from 10/09/2012. In the left upper lobe there are a few mild linear opacities decreased when  compared to 09/15/2014.  IMPRESSION: Minimal residual radiographic left upper lobe infiltrate significantly improved from 09/15/2014. No evidence of mass or nodularity. Consider followup to document complete resolution.  Stable scarring right middle lobe.   Electronically Signed   By: Skipper Cliche M.D.   On: 09/30/2014 17:39    ------------------------------------------------------------------------------------------------------------------  Thank  you for allowing [facility] to assist in the care of your patient. Our recommendations are noted above.  Please contact us if we can be of further service.   Marda Stalker, MD.  [facility] Office Number: [phone]  Patricia Pesa, M.D.  Vilinda Boehringer, M.D.  Merton Border, M.D

## 2016-01-12 NOTE — Patient Instructions (Signed)
Follow up in 6 months 

## 2016-01-17 ENCOUNTER — Ambulatory Visit (INDEPENDENT_AMBULATORY_CARE_PROVIDER_SITE_OTHER): Payer: Medicare Other | Admitting: Family Medicine

## 2016-01-17 VITALS — BP 136/70 | HR 67 | Temp 97.6°F | Resp 14 | Ht 68.0 in | Wt 164.6 lb

## 2016-01-17 DIAGNOSIS — I10 Essential (primary) hypertension: Secondary | ICD-10-CM

## 2016-01-17 DIAGNOSIS — K2100 Gastro-esophageal reflux disease with esophagitis, without bleeding: Secondary | ICD-10-CM

## 2016-01-17 DIAGNOSIS — N400 Enlarged prostate without lower urinary tract symptoms: Secondary | ICD-10-CM | POA: Diagnosis not present

## 2016-01-17 DIAGNOSIS — F0150 Vascular dementia without behavioral disturbance: Secondary | ICD-10-CM

## 2016-01-17 NOTE — Assessment & Plan Note (Signed)
Stable.  History of esophagitis. Continue Prilosec and Carafate.

## 2016-01-17 NOTE — Progress Notes (Signed)
Subjective:  Patient ID: Alexander Goodwin, male    DOB: 11-27-1920  Age: 82 y.o. MRN: [id number]  CC: Follow up  HPI:  82 year old male with a PMH of TIA, aspiration, vascular dementia, BPH, HTN, HLD presents for follow up.  HTN  Well controlled on Norvasc.   Dementia, vascular  Continues to be a problem per his wife.  Appears stable today.  He is taking Aricept. This was started after a long lengthy discussion with his wife as well as his son.  GERD  Stable on omeprazole and carafate.  BPH  No concerns.  Stable on Cardura.  Social Hx   Social History   Social History  . Marital Status: Married    Spouse Name: N/A  . Number of Children: 3  . Years of Education: HSG   Occupational History  . 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 Smoking status: Former Smoker -- 0.50 packs/day for 30 years    Types: Pipe, Cigars    Quit date: 08/20/1978  . Smokeless tobacco: Former Systems developer    Types: Chew    Quit date: 11/16/1990  . Alcohol Use: No  . Drug Use: No  . Sexual Activity: Yes   Other Topics Concern  . Not on file   Social History Narrative   HSG.   Married 1948. 3 sons - '52, '57,'58- 5 grandchildren, one deceased at 20 - OD.  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 Marriage in good health. End of Life Care -DNR/DNI,  No prolonged heroic or futile measures.    Review of Systems  Constitutional: Negative.   Respiratory: Negative.   Cardiovascular: Negative.   Gastrointestinal: Positive for constipation.   Objective:  BP 136/70 mmHg  Pulse 67  Temp(Src) 97.6 F (36.4 C) (Oral)  Resp 14  Ht 5\' 8"  (1.727 m)  Wt 164 lb 9.6 oz (74.662 kg)  BMI 25.03 kg/m2  SpO2 97%  BP/Weight 01/17/2016 01/12/2016 [id number]  Systolic BP [id number]  Diastolic BP 70 70 66  Wt. (Lbs) 164.6 162 162  BMI 25.03 24.64 23.91   Physical Exam  Constitutional: He is oriented to person, place,  and time. He appears well-developed. No distress.  Cardiovascular: Normal rate and regular rhythm.   Pulmonary/Chest: Effort normal. He has no wheezes. He has no rales.  Abdominal: Soft. He exhibits no distension. There is no tenderness.  Neurological: He is alert and oriented to person, place, and time.  Vitals reviewed.  Lab Results  Component Value Date   WBC 6.2 11/04/2015   HGB 9.4* 11/04/2015   HCT 28.5* 11/04/2015   PLT 211.0 11/04/2015   GLUCOSE 92 11/04/2015   CHOL 143 10/07/2013   TRIG 79 10/07/2013   HDL 44 10/07/2013   LDLCALC 83 10/07/2013   ALT 19 11/04/2015   AST 18 11/04/2015   NA 137 11/04/2015   K 4.3 11/04/2015   CL 104 11/04/2015   CREATININE 1.17 11/04/2015   BUN 20 11/04/2015   CO2 27 11/04/2015   INR 1.06 02/06/2015    Assessment & Plan:   Problem List Items Addressed This Visit    Hypertension - Primary    Stable. Continue Norvasc.      GERD (gastroesophageal reflux disease)    Stable.  History of esophagitis. Continue Prilosec and Carafate.      Dementia, vascular    Stable. Continuing Aricept.  Hav discussed discontinuation in the past and family has refused.      BPH (benign prostatic hyperplasia)    Stable on Cardura. Continue.         Follow-up: 6 months  [place] DO [facility]

## 2016-01-17 NOTE — Assessment & Plan Note (Signed)
Stable. Continue Norvasc.

## 2016-01-17 NOTE — Assessment & Plan Note (Signed)
Stable. Continuing Aricept. Hav discussed discontinuation in the past and family has refused.

## 2016-01-17 NOTE — Patient Instructions (Signed)
Continue your current medications.  I'll see you in 6 months.  Take care  Dr. Lacinda Axon   **Call with concerns.

## 2016-01-17 NOTE — Assessment & Plan Note (Signed)
Stable on Cardura. Continue.

## 2016-01-17 NOTE — Progress Notes (Signed)
Pre visit review using our clinic review tool, if applicable. No additional management support is needed unless otherwise documented below in the visit note. 

## 2016-01-31 ENCOUNTER — Other Ambulatory Visit: Payer: Self-pay | Admitting: Family Medicine

## 2016-01-31 NOTE — Telephone Encounter (Signed)
cardura is a historical medication. The other is no longer on his list of medications. Please advise?

## 2016-02-06 ENCOUNTER — Ambulatory Visit: Payer: Medicare Other | Admitting: Family Medicine

## 2016-02-06 DIAGNOSIS — K5900 Constipation, unspecified: Secondary | ICD-10-CM | POA: Diagnosis not present

## 2016-02-06 DIAGNOSIS — K219 Gastro-esophageal reflux disease without esophagitis: Secondary | ICD-10-CM | POA: Diagnosis not present

## 2016-02-25 ENCOUNTER — Inpatient Hospital Stay
Admission: EM | Admit: 2016-02-25 | Discharge: 2016-02-26 | DRG: 871 | Disposition: A | Discharge status: Home / Self Care | Payer: Medicare Other | Attending: Internal Medicine | Admitting: Internal Medicine

## 2016-02-25 ENCOUNTER — Emergency Department: Payer: Medicare Other

## 2016-02-25 ENCOUNTER — Encounter: Payer: Self-pay | Admitting: Emergency Medicine

## 2016-02-25 DIAGNOSIS — E785 Hyperlipidemia, unspecified: Secondary | ICD-10-CM | POA: Diagnosis present

## 2016-02-25 DIAGNOSIS — E860 Dehydration: Secondary | ICD-10-CM | POA: Diagnosis present

## 2016-02-25 DIAGNOSIS — H409 Unspecified glaucoma: Secondary | ICD-10-CM | POA: Diagnosis present

## 2016-02-25 DIAGNOSIS — Z66 Do not resuscitate: Secondary | ICD-10-CM | POA: Diagnosis present

## 2016-02-25 DIAGNOSIS — M47892 Other spondylosis, cervical region: Secondary | ICD-10-CM | POA: Diagnosis present

## 2016-02-25 DIAGNOSIS — I10 Essential (primary) hypertension: Secondary | ICD-10-CM | POA: Diagnosis present

## 2016-02-25 DIAGNOSIS — N179 Acute kidney failure, unspecified: Secondary | ICD-10-CM | POA: Diagnosis present

## 2016-02-25 DIAGNOSIS — N138 Other obstructive and reflux uropathy: Secondary | ICD-10-CM | POA: Diagnosis present

## 2016-02-25 DIAGNOSIS — M1711 Unilateral primary osteoarthritis, right knee: Secondary | ICD-10-CM | POA: Diagnosis present

## 2016-02-25 DIAGNOSIS — F0150 Vascular dementia without behavioral disturbance: Secondary | ICD-10-CM | POA: Diagnosis present

## 2016-02-25 DIAGNOSIS — A419 Sepsis, unspecified organism: Principal | ICD-10-CM | POA: Diagnosis present

## 2016-02-25 DIAGNOSIS — R918 Other nonspecific abnormal finding of lung field: Secondary | ICD-10-CM | POA: Diagnosis not present

## 2016-02-25 DIAGNOSIS — M1610 Unilateral primary osteoarthritis, unspecified hip: Secondary | ICD-10-CM | POA: Diagnosis present

## 2016-02-25 DIAGNOSIS — K219 Gastro-esophageal reflux disease without esophagitis: Secondary | ICD-10-CM | POA: Diagnosis present

## 2016-02-25 DIAGNOSIS — Y95 Nosocomial condition: Secondary | ICD-10-CM | POA: Diagnosis present

## 2016-02-25 DIAGNOSIS — Z8673 Personal history of transient ischemic attack (TIA), and cerebral infarction without residual deficits: Secondary | ICD-10-CM | POA: Diagnosis not present

## 2016-02-25 DIAGNOSIS — J189 Pneumonia, unspecified organism: Secondary | ICD-10-CM | POA: Diagnosis not present

## 2016-02-25 DIAGNOSIS — N401 Enlarged prostate with lower urinary tract symptoms: Secondary | ICD-10-CM | POA: Diagnosis present

## 2016-02-25 DIAGNOSIS — Z881 Allergy status to other antibiotic agents status: Secondary | ICD-10-CM

## 2016-02-25 DIAGNOSIS — I959 Hypotension, unspecified: Secondary | ICD-10-CM | POA: Diagnosis not present

## 2016-02-25 DIAGNOSIS — Z87891 Personal history of nicotine dependence: Secondary | ICD-10-CM

## 2016-02-25 LAB — COMPREHENSIVE METABOLIC PANEL
ALK PHOS: 90 U/L (ref 38–126)
ALT: 20 U/L (ref 17–63)
ANION GAP: 9 (ref 5–15)
AST: 22 U/L (ref 15–41)
Albumin: 3.9 g/dL (ref 3.5–5.0)
BILIRUBIN TOTAL: 0.6 mg/dL (ref 0.3–1.2)
BUN: 23 mg/dL — ABNORMAL HIGH (ref 6–20)
CALCIUM: 9.6 mg/dL (ref 8.9–10.3)
CO2: 25 mmol/L (ref 22–32)
Chloride: 106 mmol/L (ref 101–111)
Creatinine, Ser: 1.45 mg/dL — ABNORMAL HIGH (ref 0.61–1.24)
GFR, EST AFRICAN AMERICAN: 46 mL/min — AB (ref >60)
GFR, EST NON AFRICAN AMERICAN: 40 mL/min — AB (ref >60)
GLUCOSE: 148 mg/dL — AB (ref 65–99)
POTASSIUM: 4.2 mmol/L (ref 3.5–5.1)
SODIUM: 140 mmol/L (ref 135–145)
TOTAL PROTEIN: 6.9 g/dL (ref 6.5–8.1)

## 2016-02-25 LAB — CBC WITH DIFFERENTIAL/PLATELET
BASOS PCT: 0 %
Basophils Absolute: 0 10*3/uL (ref 0–0.1)
EOS ABS: 0.1 10*3/uL (ref 0–0.7)
EOS PCT: 2 %
HCT: 37.9 % — ABNORMAL LOW (ref 40.0–52.0)
Hemoglobin: 13 g/dL (ref 13.0–18.0)
LYMPHS ABS: 2.2 10*3/uL (ref 1.0–3.6)
Lymphocytes Relative: 32 %
MCH: 32.2 pg (ref 26.0–34.0)
MCHC: 34.3 g/dL (ref 32.0–36.0)
MCV: 93.8 fL (ref 80.0–100.0)
Monocytes Absolute: 0.8 10*3/uL (ref 0.2–1.0)
Monocytes Relative: 12 %
NEUTROS PCT: 54 %
Neutro Abs: 3.7 10*3/uL (ref 1.4–6.5)
PLATELETS: 192 10*3/uL (ref 150–440)
RBC: 4.04 MIL/uL — AB (ref 4.40–5.90)
RDW: 15.1 % — ABNORMAL HIGH (ref 11.5–14.5)
WBC: 6.9 10*3/uL (ref 3.8–10.6)

## 2016-02-25 LAB — URINALYSIS COMPLETE WITH MICROSCOPIC (ARMC ONLY)
Bacteria, UA: NONE SEEN
Bilirubin Urine: NEGATIVE
GLUCOSE, UA: NEGATIVE mg/dL
HGB URINE DIPSTICK: NEGATIVE
KETONES UR: NEGATIVE mg/dL
Leukocytes, UA: NEGATIVE
NITRITE: NEGATIVE
Protein, ur: NEGATIVE mg/dL
SPECIFIC GRAVITY, URINE: 1.004 — AB (ref 1.005–1.030)
Squamous Epithelial / LPF: NONE SEEN
WBC, UA: NONE SEEN WBC/hpf (ref 0–5)
pH: 6 (ref 5.0–8.0)

## 2016-02-25 LAB — PROTIME-INR
INR: 1.21
Prothrombin Time: 15.5 seconds — ABNORMAL HIGH (ref 11.4–15.0)

## 2016-02-25 LAB — LACTIC ACID, PLASMA
LACTIC ACID, VENOUS: 2 mmol/L — AB (ref 0.5–1.9)
Lactic Acid, Venous: 0.7 mmol/L (ref 0.5–1.9)

## 2016-02-25 LAB — LIPASE, BLOOD: LIPASE: 21 U/L (ref 11–51)

## 2016-02-25 LAB — TROPONIN I

## 2016-02-25 LAB — APTT: APTT: 34 s (ref 24–36)

## 2016-02-25 MED ORDER — ONDANSETRON HCL 4 MG/2ML IJ SOLN: 4.0000 mg | Freq: Four times a day (QID) | INTRAMUSCULAR | Status: DC | PRN

## 2016-02-25 MED ORDER — SODIUM CHLORIDE 0.9 % IV BOLUS (SEPSIS)
1000.0000 mL | Freq: Once | INTRAVENOUS | Status: AC
  Administered 2016-02-25: 1000 mL via INTRAVENOUS

## 2016-02-25 MED ORDER — ONDANSETRON HCL 4 MG PO TABS: 4.0000 mg | ORAL_TABLET | Freq: Four times a day (QID) | ORAL | Status: DC | PRN

## 2016-02-25 MED ORDER — BRIMONIDINE TARTRATE 0.15 % OP SOLN
1.0000 [drp] | Freq: Three times a day (TID) | OPHTHALMIC | Status: DC
  Administered 2016-02-25 – 2016-02-26 (×2): 1 [drp] via OPHTHALMIC
  Filled 2016-02-25: qty 5

## 2016-02-25 MED ORDER — OCUVITE-LUTEIN PO CAPS
1.0000 | ORAL_CAPSULE | Freq: Every day | ORAL | Status: DC
  Administered 2016-02-26: 1 via ORAL
  Filled 2016-02-25: qty 1

## 2016-02-25 MED ORDER — CALCIUM CARBONATE-VITAMIN D 500-200 MG-UNIT PO TABS
1.0000 | ORAL_TABLET | Freq: Every day | ORAL | Status: DC
  Administered 2016-02-26: 1 via ORAL
  Filled 2016-02-25: qty 1

## 2016-02-25 MED ORDER — DOXAZOSIN MESYLATE 4 MG PO TABS
4.0000 mg | ORAL_TABLET | Freq: Every day | ORAL | Status: DC
  Administered 2016-02-25: 4 mg via ORAL
  Filled 2016-02-25: qty 1

## 2016-02-25 MED ORDER — GABAPENTIN 300 MG PO CAPS
300.0000 mg | ORAL_CAPSULE | Freq: Two times a day (BID) | ORAL | Status: DC
  Administered 2016-02-25 – 2016-02-26 (×2): 300 mg via ORAL
  Filled 2016-02-25 (×2): qty 1

## 2016-02-25 MED ORDER — AMLODIPINE BESYLATE 5 MG PO TABS: 5.0000 mg | ORAL_TABLET | Freq: Every day | ORAL | Status: DC

## 2016-02-25 MED ORDER — BRINZOLAMIDE 1 % OP SUSP
1.0000 [drp] | Freq: Three times a day (TID) | OPHTHALMIC | Status: DC
  Administered 2016-02-25 – 2016-02-26 (×2): 1 [drp] via OPHTHALMIC
  Filled 2016-02-25: qty 10

## 2016-02-25 MED ORDER — DONEPEZIL HCL 5 MG PO TABS
10.0000 mg | ORAL_TABLET | Freq: Every day | ORAL | Status: DC
  Administered 2016-02-25: 10 mg via ORAL
  Filled 2016-02-25: qty 2

## 2016-02-25 MED ORDER — FERROUS GLUCONATE 324 (38 FE) MG PO TABS
324.0000 mg | ORAL_TABLET | Freq: Every day | ORAL | Status: DC
  Administered 2016-02-26: 324 mg via ORAL
  Filled 2016-02-25: qty 1

## 2016-02-25 MED ORDER — SODIUM CHLORIDE 0.9 % IV BOLUS (SEPSIS)
250.0000 mL | Freq: Once | INTRAVENOUS | Status: AC
  Administered 2016-02-25: 250 mL via INTRAVENOUS

## 2016-02-25 MED ORDER — HEPARIN SODIUM (PORCINE) 5000 UNIT/ML IJ SOLN
5000.0000 [IU] | Freq: Three times a day (TID) | INTRAMUSCULAR | Status: DC
  Administered 2016-02-25 – 2016-02-26 (×3): 5000 [IU] via SUBCUTANEOUS
  Filled 2016-02-25 (×3): qty 1

## 2016-02-25 MED ORDER — SUCRALFATE 1 G PO TABS
1.0000 g | ORAL_TABLET | Freq: Two times a day (BID) | ORAL | Status: DC
  Administered 2016-02-25 – 2016-02-26 (×2): 1 g via ORAL
  Filled 2016-02-25 (×2): qty 1

## 2016-02-25 MED ORDER — PIPERACILLIN-TAZOBACTAM 3.375 G IVPB 30 MIN
3.3750 g | Freq: Once | INTRAVENOUS | Status: AC
  Administered 2016-02-25: 3.375 g via INTRAVENOUS
  Filled 2016-02-25: qty 50

## 2016-02-25 MED ORDER — VANCOMYCIN HCL IN DEXTROSE 1-5 GM/200ML-% IV SOLN
1000.0000 mg | Freq: Once | INTRAVENOUS | Status: AC
  Administered 2016-02-25: 1000 mg via INTRAVENOUS
  Filled 2016-02-25: qty 200

## 2016-02-25 MED ORDER — PIPERACILLIN-TAZOBACTAM 3.375 G IVPB
3.3750 g | Freq: Three times a day (TID) | INTRAVENOUS | Status: DC
  Administered 2016-02-25 – 2016-02-26 (×2): 3.375 g via INTRAVENOUS
  Filled 2016-02-25 (×5): qty 50

## 2016-02-25 MED ORDER — ASPIRIN EC 81 MG PO TBEC
81.0000 mg | DELAYED_RELEASE_TABLET | Freq: Every day | ORAL | Status: DC
  Administered 2016-02-26: 81 mg via ORAL
  Filled 2016-02-25: qty 1

## 2016-02-25 MED ORDER — PIPERACILLIN-TAZOBACTAM 3.375 G IVPB: 3.3750 g | Freq: Three times a day (TID) | INTRAVENOUS | Status: DC

## 2016-02-25 MED ORDER — BISACODYL 10 MG RE SUPP: 10.0000 mg | Freq: Every day | RECTAL | Status: DC | PRN

## 2016-02-25 MED ORDER — ACETAMINOPHEN 650 MG RE SUPP: 650.0000 mg | Freq: Four times a day (QID) | RECTAL | Status: DC | PRN

## 2016-02-25 MED ORDER — SODIUM CHLORIDE 0.45 % IV SOLN
INTRAVENOUS | Status: DC
  Administered 2016-02-25: 16:00:00 via INTRAVENOUS

## 2016-02-25 MED ORDER — CALCIUM CARBONATE ANTACID 500 MG PO CHEW: 1.0000 | CHEWABLE_TABLET | ORAL | Status: DC | PRN

## 2016-02-25 MED ORDER — MORPHINE SULFATE (PF) 2 MG/ML IV SOLN: 2.0000 mg | INTRAVENOUS | Status: DC | PRN

## 2016-02-25 MED ORDER — PANTOPRAZOLE SODIUM 40 MG PO TBEC
40.0000 mg | DELAYED_RELEASE_TABLET | Freq: Every day | ORAL | Status: DC
  Administered 2016-02-26: 40 mg via ORAL
  Filled 2016-02-25: qty 1

## 2016-02-25 MED ORDER — LATANOPROST 0.005 % OP SOLN
1.0000 [drp] | Freq: Every day | OPHTHALMIC | Status: DC
  Administered 2016-02-25: 1 [drp] via OPHTHALMIC
  Filled 2016-02-25: qty 2.5

## 2016-02-25 MED ORDER — DOCUSATE SODIUM 100 MG PO CAPS
100.0000 mg | ORAL_CAPSULE | Freq: Two times a day (BID) | ORAL | Status: DC
Start: 2016-02-25 — End: 2016-02-26
  Administered 2016-02-25 – 2016-02-26 (×2): 100 mg via ORAL
  Filled 2016-02-25 (×2): qty 1

## 2016-02-25 MED ORDER — IPRATROPIUM-ALBUTEROL 0.5-2.5 (3) MG/3ML IN SOLN
3.0000 mL | Freq: Four times a day (QID) | RESPIRATORY_TRACT | Status: DC
  Administered 2016-02-25 – 2016-02-26 (×4): 3 mL via RESPIRATORY_TRACT
  Filled 2016-02-25 (×5): qty 3

## 2016-02-25 MED ORDER — VANCOMYCIN HCL IN DEXTROSE 750-5 MG/150ML-% IV SOLN
750.0000 mg | INTRAVENOUS | Status: DC
  Administered 2016-02-25: 750 mg via INTRAVENOUS
  Filled 2016-02-25 (×2): qty 150

## 2016-02-25 MED ORDER — ACETAMINOPHEN 325 MG PO TABS: 650.0000 mg | ORAL_TABLET | Freq: Four times a day (QID) | ORAL | Status: DC | PRN

## 2016-02-25 NOTE — ED Provider Notes (Signed)
[facility] Emergency Department Provider Note  ____________________________________________  Time seen: 12:05 PM  I have reviewed the triage vital signs and the nursing notes.   HISTORY  Chief Complaint Weakness  Level 5 caveat:  Portions of the history and physical were unable to be obtained due to the patient's acute illness and confusion and chronic dementia   HPI Alexander Goodwin is a 82 y.o. male brought to the ED from [facility] due to confusion. He is also been having urinary frequency. On arrival found to have hypotension of 65/30. Son reports that the patient was noted to feel worse when he was upright walking. Denies chest pain. Patient reports "I feel rough"    Past Medical History  Diagnosis Date  . Diverticulitis of colon   . History of colon polyps   . Arthritis     right knee, hips, neck. He denies any major limitation in activities  . GERD (gastroesophageal reflux disease)     well controlled with medication. No nocturnal symptoms on a regular basis  . Glaucoma     both eyes, followed closely by Dr. Janyth Contes  . Esophagitis     Last EGD Fall '11   . Abscess of lung without pneumonia ([place])     Right upper lobe chronic lung abscess and prior left upper lobe lung abscess due to chronic aspiration  . Pancreatitis     resolved after GB surgery  . Stroke [facility])     TIAs - age 49  . BPH (benign prostatic hypertrophy) with urinary obstruction   . Hyperlipemia   . Hypertension   . Seasonal allergies   . Dysphagia, oropharyngeal   . Dementia      Patient Active Problem List   Diagnosis Date Noted  . Dementia, vascular 06/15/2015  . DNR (do not resuscitate) 09/15/2014  . 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 Hyperlipemia   . Hypertension   . History of colon polyps   . Arthritis   . GERD (gastroesophageal reflux disease)   . Glaucoma   . History of  esophagitis   . History of TIA (transient ischemic attack)   . BPH (benign prostatic hyperplasia)      Past Surgical History  Procedure Laterality Date  . Cholecystectomy      '88 - laparotomy ([place])  . Appendectomy      '88 along with GB  . Bronchoscopy  1993  . Video bronchoscopy  03/26/2012    Procedure: VIDEO BRONCHOSCOPY WITH FLUORO;  Surgeon: Elsie Stain, MD;  Location: [facility];  Service: Cardiopulmonary;  Laterality: Bilateral;     Current Outpatient Rx  Name  Route  Sig  Dispense  Refill  . acetaminophen (TYLENOL ARTHRITIS PAIN) 650 MG CR tablet   Oral   Take 650 mg by mouth every 8 (eight) hours as needed for pain.          Marland Kitchen acidophilus (RISAQUAD) CAPS capsule   Oral   Take 1 capsule by mouth daily.         Marland Kitchen amLODipine (NORVASC) 10 MG tablet   Oral   Take 10 mg by mouth daily.           . beta carotene w/minerals (OCUVITE) tablet   Oral   Take 1 tablet by mouth daily.         . brimonidine (ALPHAGAN P) 0.1 % SOLN   Both Eyes   Place  1 drop into both eyes 3 (three) times daily.          . brinzolamide (AZOPT) 1 % ophthalmic suspension   Both Eyes   Place 1 drop into both eyes 3 (three) times daily.          . calcium carbonate (TUMS - DOSED IN MG ELEMENTAL CALCIUM) 500 MG chewable tablet   Oral   Chew 1 tablet by mouth as needed for indigestion or heartburn.         . calcium-vitamin D (OSCAL 500/200 D-3) 500-200 MG-UNIT per tablet   Oral   Take 1 tablet by mouth daily.          Marland Kitchen donepezil (ARICEPT) 10 MG tablet   Oral   Take 10 mg by mouth at bedtime.         . donepezil (ARICEPT) 5 MG tablet      TAKE TWO TABLETS AT BEDTIME.   90 tablet   0   . doxazosin (CARDURA) 4 MG tablet      TAKE 1 TABLET BY MOUTH NIGHTLY   90 tablet   0   . gabapentin (NEURONTIN) 300 MG capsule      TAKE ONE CAPSULE TWICE A DAY   180 capsule   1   . Iron, Ferrous Gluconate, 256 (28 FE) MG TABS   Oral   Take 1 tablet by mouth  daily.         Marland Kitchen latanoprost (XALATAN) 0.005 % ophthalmic solution   Both Eyes   Place 1 drop into both eyes at bedtime.          Marland Kitchen omeprazole (PRILOSEC) 20 MG capsule   Oral   Take 20 mg by mouth 2 (two) times daily.         . sucralfate (CARAFATE) 1 G tablet   Oral   Take 1 tablet by mouth 2 (two) times daily before a meal.      0      Allergies Clindamycin/lincomycin and Erythromycin base   Family History  Problem Relation Age of Onset  . Diabetes Mother   . Hypertension Mother   . Heart disease Mother   . Stroke Father   . Heart disease Father   . Diabetes Father   . Rheum arthritis Father   . Arthritis Father   . Cancer Sister     uterine cancer  . COPD Brother   . Hypertension Brother   . Cancer Brother     colon. brain, and hip  . Diabetes Brother   . Cancer Brother   . Diabetes Brother   . Cancer Sister     gyn malignancy  . Cancer Sister     pancreatic cancer  . Allergies Sister   . Allergies Son   . Asthma Sister   . Asthma Son   . Diabetes Maternal Grandmother   . Diabetes Maternal Grandfather   . Diabetes Paternal Grandmother   . Diabetes Paternal Grandfather     Social History Social History  Substance Use Topics  . Smoking status: Former Smoker -- 0.50 packs/day for 30 years    Types: Pipe, Cigars    Quit date: 08/20/1978  . Smokeless tobacco: Former Systems developer    Types: Chew    Quit date: 11/16/1990  . Alcohol Use: No    Review of Systems  Constitutional:   No fever or chills.  ENT:   Dry mouth. Cardiovascular:   No chest pain. Respiratory:   No dyspnea positive  cough. Gastrointestinal:   Negative for abdominal pain, vomiting and diarrhea.  Genitourinary:   Positive for urinary frequency. Musculoskeletal:   Negative for focal pain or swelling Neurological:   Negative for headaches 10-point ROS otherwise negative.  ____________________________________________   PHYSICAL EXAM:  VITAL SIGNS: ED Triage Vitals  Enc  Vitals Group     BP 02/25/16 1140 65/38 mmHg     Pulse Rate 02/25/16 1140 68     Resp 02/25/16 1140 20     Temp 02/25/16 1140 97.6 F (36.4 C)     Temp Source 02/25/16 1140 Oral     SpO2 02/25/16 1140 97 %     Weight 02/25/16 1140 155 lb (70.308 kg)     Height 02/25/16 1140 5\' 5"  (1.651 m)     Head Cir --      Peak Flow --      Pain Score 02/25/16 1141 10     Pain Loc --      Pain Edu? --      Excl. in [place]? --     Vital signs reviewed, nursing assessments reviewed.   Constitutional:   Alert and orientedTo person and place. Not in distress. Eyes:   No scleral icterus. No conjunctival pallor. PERRL. EOMI.  No nystagmus. ENT   Head:   Normocephalic and atraumatic.   Nose:   No congestion/rhinnorhea. No septal hematoma   Mouth/Throat:   Dry mucous membranes, no pharyngeal erythema. No peritonsillar mass.    Neck:   No stridor. No SubQ emphysema. No meningismus. Hematological/Lymphatic/Immunilogical:   No cervical lymphadenopathy. Cardiovascular:   RRR. Symmetric bilateral radial and DP pulses.  No murmurs.  Respiratory:   Normal respiratory effort without tachypnea nor retractions. Breath sounds are clear and equal bilaterally. No wheezes/rales/rhonchi. Gastrointestinal:   Soft and nontender. Non distended. There is no CVA tenderness.  No rebound, rigidity, or guarding. Genitourinary:   deferred Musculoskeletal:   Nontender with normal range of motion in all extremities. No joint effusions.  No lower extremity tenderness.  No edema. Neurologic:   Normal speech and language.  CN 2-10 normal. Motor grossly intact. No gross focal neurologic deficits are appreciated.  Skin:    Skin is warm, dry and intact. No rash noted.  No petechiae, purpura, or bullae.  ____________________________________________    LABS (pertinent positives/negatives) (all labs ordered are listed, but only abnormal results are displayed) Labs Reviewed  COMPREHENSIVE METABOLIC PANEL - Abnormal;  Notable for the following:    Glucose, Bld 148 (*)    BUN 23 (*)    Creatinine, Ser 1.45 (*)    GFR calc non Af Amer 40 (*)    GFR calc Af Amer 46 (*)    All other components within normal limits  LACTIC ACID, PLASMA - Abnormal; Notable for the following:    Lactic Acid, Venous 2.0 (*)    All other components within normal limits  CBC WITH DIFFERENTIAL/PLATELET - Abnormal; Notable for the following:    RBC 4.04 (*)    HCT 37.9 (*)    RDW 15.1 (*)    All other components within normal limits  PROTIME-INR - Abnormal; Notable for the following:    Prothrombin Time 15.5 (*)    All other components within normal limits  CULTURE, BLOOD (ROUTINE X 2)  CULTURE, BLOOD (ROUTINE X 2)  URINE CULTURE  CULTURE, EXPECTORATED SPUTUM-ASSESSMENT  LIPASE, BLOOD  TROPONIN I  APTT  LACTIC ACID, PLASMA  URINALYSIS COMPLETEWITH MICROSCOPIC ([facility] ONLY)   ____________________________________________  EKG  Interpreted by me Sinus rhythm rate of 69, normal axis and intervals. Poor R-wave progression in anterior precordial leads. Normal ST segments and T waves.Unchanged from 10/26/2015  ____________________________________________    RADIOLOGY  Chest x-ray reveals acute infiltrates in the right upper and mid lung fields.  ____________________________________________   PROCEDURES CRITICAL CARE Performed by: Joni Fears, Garyn Waguespack   Total critical care time: 35 minutes  Critical care time was exclusive of separately billable procedures and treating other patients.  Critical care was necessary to treat or prevent imminent or life-threatening deterioration.  Critical care was time spent personally by me on the following activities: development of treatment plan with patient and/or surrogate as well as nursing, discussions with consultants, evaluation of patient's response to treatment, examination of patient, obtaining history from patient or surrogate, ordering and performing treatments and  interventions, ordering and review of laboratory studies, ordering and review of radiographic studies, pulse oximetry and re-evaluation of patient's condition.   ____________________________________________   INITIAL IMPRESSION / ASSESSMENT AND PLAN / ED COURSE  Pertinent labs & imaging results that were available during my care of the patient were reviewed by me and considered in my medical decision making (see chart for details).  Patient presents with confusion and hypotension. Code sepsis initiated, IV fluids given for volume resuscitation. Empiric vancomycin and Zosyn started. With IV fluids blood pressure has normalized. Initial lactate elevated at 2.0. Chest x-ray reveals some new infiltrates compared to previous, indicative of pneumonia with sepsis and delirium.  Case discussed with hospitalist for admission     ____________________________________________   FINAL CLINICAL IMPRESSION(S) / ED DIAGNOSES  Final diagnoses:  CAP (community acquired pneumonia)  Sepsis, due to unspecified organism [facility])       Portions of this note were generated with dragon dictation software. Dictation errors may occur despite best attempts at proofreading.   Carrie Mew, MD 02/25/16 [id number]

## 2016-02-25 NOTE — ED Notes (Signed)
Patient arrives with son, per report having to get up frequently at night to urinate, more confused than usual, patient ot [place]

## 2016-02-25 NOTE — Consult Note (Signed)
Pharmacy Antibiotic Note  Alexander Goodwin is a 82 y.o. male admitted on 02/25/2016 with pneumonia and sepsis.  Pharmacy has been consulted for vancomycin and zosyn dosing.  Plan: Pt received 1g of vancomycin the ED. Will start vancomycin 750 q 24 hours 6 hours from initial dose for stacked dosing. Vancomycin 750 IV every 24 hours.  Goal trough 15-20 mcg/mL. Zosyn 3.375g IV q8h (4 hour infusion).  Since zosyn was infused over 30 min in ED will start 6 hours after initial dose. Vancomycin trough at steady state 7/11 @ 1800  Height: 5\' 5"  (165.1 cm) Weight: 159 lb 14.4 oz (72.53 kg) IBW/kg (Calculated) : 61.5  Temp (24hrs), Avg:97.6 F (36.4 C), Min:97.6 F (36.4 C), Max:97.6 F (36.4 C)   Recent Labs Lab 02/25/16 1154  WBC 6.9  CREATININE 1.45*  LATICACIDVEN 2.0*    Estimated Creatinine Clearance: 27.7 mL/min (by C-G formula based on Cr of 1.45).    Allergies  Allergen Reactions  . Clindamycin/Lincomycin Itching  . Erythromycin Base Rash    Antimicrobials this admission: vancomycin 7/8 >>  zosyn 7/8 >>   Dose adjustments this admission:   Microbiology results: 7/8 BCx: pending 7/8 UCx: pending  7/8 Sputum: pending   Thank you for allowing pharmacy to be a part of this patient's care.  Ramond Dial, Pharm.D Clinical Pharmacist   02/25/2016 2:07 PM

## 2016-02-25 NOTE — H&P (Signed)
History and Physical    [name] DOB: [birth year] DOA: 02/25/2016  Referring physician: Dr. Joni Fears PCP: Coral Spikes, DO  Specialists: none  Chief Complaint: hypotension  HPI: Alexander Goodwin is a 82 y.o. male has a past medical history significant for vascular dementia and recurrent pneumonia who resides at the [facility] sent to ER by his nurse for weakness and hypotension. Pt is demented and unable to give a clear hx. He states he is not sleeping well and has some cough and SOB. Denies CP. No N/V/D. In the ER, the patient was noted to be hypotensive. CXR reveals RUL and RML pneumonia. Lactic acid is elevated. Dehydration noted on labs. He is now admitted.  Review of Systems: The patient denies anorexia, fever, weight loss,, vision loss, decreased hearing, hoarseness, chest pain, syncope, dyspnea on exertion, peripheral edema, balance deficits, hemoptysis, abdominal pain, melena, hematochezia, severe indigestion/heartburn, hematuria, incontinence, genital sores, muscle weakness, suspicious skin lesions, transient blindness, difficulty walking, depression, unusual weight change, abnormal bleeding, enlarged lymph nodes, angioedema, and breast masses.   Past Medical History  Diagnosis Date  . Diverticulitis of colon   . History of colon polyps   . Arthritis     right knee, hips, neck. He denies any major limitation in activities  . GERD (gastroesophageal reflux disease)     well controlled with medication. No nocturnal symptoms on a regular basis  . Glaucoma     both eyes, followed closely by Dr. Janyth Contes  . Esophagitis     Last EGD Fall '11   . Abscess of lung without pneumonia ([place])     Right upper lobe chronic lung abscess and prior left upper lobe lung abscess due to chronic aspiration  . Pancreatitis     resolved after GB surgery  . Stroke [facility])     TIAs - age 67  . BPH (benign prostatic hypertrophy) with urinary obstruction   . Hyperlipemia   .  Hypertension   . Seasonal allergies   . Dysphagia, oropharyngeal   . Dementia    Past Surgical History  Procedure Laterality Date  . Cholecystectomy      '88 - laparotomy ([place])  . Appendectomy      '88 along with GB  . Bronchoscopy  1993  . Video bronchoscopy  03/26/2012    Procedure: VIDEO BRONCHOSCOPY WITH FLUORO;  Surgeon: Elsie Stain, MD;  Location: [facility];  Service: Cardiopulmonary;  Laterality: Bilateral;   Social History:  reports that he quit smoking about 37 years ago. His smoking use included Pipe and Cigars. He quit smokeless tobacco use about 25 years ago. His smokeless tobacco use included Chew. He reports that he does not drink alcohol or use illicit drugs.  Allergies  Allergen Reactions  . Clindamycin/Lincomycin Itching  . Erythromycin Base Rash    Family History  Problem Relation Age of Onset  . Diabetes Mother   . Hypertension Mother   . Heart disease Mother   . Stroke Father   . Heart disease Father   . Diabetes Father   . Rheum arthritis Father   . Arthritis Father   . Cancer Sister     uterine cancer  . COPD Brother   . Hypertension Brother   . Cancer Brother     colon. brain, and hip  . Diabetes Brother   . Cancer Brother   . Diabetes Brother   . Cancer Sister     gyn malignancy  . Cancer  Sister     pancreatic cancer  . Allergies Sister   . Allergies Son   . Asthma Sister   . Asthma Son   . Diabetes Maternal Grandmother   . Diabetes Maternal Grandfather   . Diabetes Paternal Grandmother   . Diabetes Paternal Grandfather     Prior to Admission medications   Medication Sig Start Date End Date Taking? Authorizing Provider  acetaminophen (TYLENOL ARTHRITIS PAIN) 650 MG CR tablet Take 650 mg by mouth every 8 (eight) hours as needed for pain.     Historical Provider, MD  acidophilus (RISAQUAD) CAPS capsule Take 1 capsule by mouth daily.    Historical Provider, MD  amLODipine (NORVASC) 10 MG tablet Take 10 mg by mouth daily.       Historical Provider, MD  beta carotene w/minerals (OCUVITE) tablet Take 1 tablet by mouth daily.    Historical Provider, MD  brimonidine (ALPHAGAN P) 0.1 % SOLN Place 1 drop into both eyes 3 (three) times daily.     Historical Provider, MD  brinzolamide (AZOPT) 1 % ophthalmic suspension Place 1 drop into both eyes 3 (three) times daily.     Historical Provider, MD  calcium carbonate (TUMS - DOSED IN MG ELEMENTAL CALCIUM) 500 MG chewable tablet Chew 1 tablet by mouth as needed for indigestion or heartburn.    Historical Provider, MD  calcium-vitamin D (OSCAL 500/200 D-3) 500-200 MG-UNIT per tablet Take 1 tablet by mouth daily.     Historical Provider, MD  donepezil (ARICEPT) 10 MG tablet Take 10 mg by mouth at bedtime.    Historical Provider, MD  donepezil (ARICEPT) 5 MG tablet TAKE TWO TABLETS AT BEDTIME. 02/01/16   Coral Spikes, DO  doxazosin (CARDURA) 4 MG tablet TAKE 1 TABLET BY MOUTH NIGHTLY 02/01/16   Coral Spikes, DO  gabapentin (NEURONTIN) 300 MG capsule TAKE ONE CAPSULE TWICE A DAY 12/28/15   Coral Spikes, DO  Iron, Ferrous Gluconate, 256 (28 FE) MG TABS Take 1 tablet by mouth daily.    Historical Provider, MD  latanoprost (XALATAN) 0.005 % ophthalmic solution Place 1 drop into both eyes at bedtime.     Historical Provider, MD  omeprazole (PRILOSEC) 20 MG capsule Take 20 mg by mouth 2 (two) times daily.    Historical Provider, MD  sucralfate (CARAFATE) 1 G tablet Take 1 tablet by mouth 2 (two) times daily before a meal. 01/17/15   Historical Provider, MD   Physical Exam: Filed Vitals:   02/25/16 1156 02/25/16 1245 02/25/16 1300 02/25/16 1315  BP:  150/61 135/56 117/51  Pulse:  70 64 56  Temp:      TempSrc:      Resp:  13 13 20   Height: 5\' 5"  (1.651 m)     Weight: 72.53 kg (159 lb 14.4 oz)     SpO2:  100% 100% 100%     General:  No apparent distress, WDWN, Godwin/AT  Eyes: PERRL, EOMI, no scleral icterus, conjunctiva clear  ENT: dry oropharynx without lesions or exudate, TM's benign,  dentition fair  Neck: supple, no lymphadenopathy. No thyromegaly. Soft bilateral bruits noted  Cardiovascular: regular rate without MRG; 2+ peripheral pulses, no JVD, no peripheral edema  Respiratory: Scattered right-sided rhonchi with increased respiratory effort. No wheezes or rales  Abdomen: soft, non tender to palpation, positive bowel sounds, no guarding, no rebound  Skin: no rashes or lesions  Musculoskeletal: normal bulk and tone, no joint swelling  Psychiatric: normal mood and affect, alert, oriented to  person only  Neurologic: CN 2-12 grossly intact, Motor strength 5/5 in all 4 groups with symmetric DTR's and non-focal sensory exam  Labs on Admission:  Basic Metabolic Panel:  Recent Labs Lab 02/25/16 1154  NA 140  K 4.2  CL 106  CO2 25  GLUCOSE 148*  BUN 23*  CREATININE 1.45*  CALCIUM 9.6   Liver Function Tests:  Recent Labs Lab 02/25/16 1154  AST 22  ALT 20  ALKPHOS 90  BILITOT 0.6  PROT 6.9  ALBUMIN 3.9    Recent Labs Lab 02/25/16 1156  LIPASE 21   No results for input(s): AMMONIA in the last 168 hours. CBC:  Recent Labs Lab 02/25/16 1154  WBC 6.9  NEUTROABS 3.7  HGB 13.0  HCT 37.9*  MCV 93.8  PLT 192   Cardiac Enzymes:  Recent Labs Lab 02/25/16 1156  TROPONINI <0.03    BNP (last 3 results)  Recent Labs  10/16/15 0016  BNP 57.0    ProBNP (last 3 results) No results for input(s): PROBNP in the last 8760 hours.  CBG: No results for input(s): GLUCAP in the last 168 hours.  Radiological Exams on Admission: Dg Chest 2 View  02/25/2016  CLINICAL DATA:  Code sepsis. EXAM: CHEST  2 VIEW COMPARISON:  10/26/2015 chest radiograph and chest CT. FINDINGS: Stable cardiomediastinal silhouette with top-normal heart size and granulomatous calcified right paratracheal, subcarinal and bilateral hilar lymph nodes. No pneumothorax. No significant pleural effusion. Focal patchy opacity in the right upper parahilar lung appears slightly  increased. Focal patchy opacity in the anterior right mid lung appears slightly increased. Previously noted hazy right lower lobe opacity appears resolved. No consolidative airspace disease in the left lung. IMPRESSION: Indeterminate focal patchy opacities in the right upper parahilar and anterior right mid lungs, which appear slightly increased since 10/26/2015. Previously described patchy right lower lobe opacity appears resolved. The patient has had waxing and waning opacities in these locations on chest CT studies back to 2013 suggestive of a recurrent infectious or inflammatory process, with the differential including vasculitis. Neoplasm is not entirely excluded. Consider further evaluation with chest CT and/or short-term follow-up chest radiograph. Electronically Signed   By: Ilona Sorrel M.D.   On: 02/25/2016 12:26    EKG: Independently reviewed.  Assessment/Plan Principal Problem:   HCAP (healthcare-associated pneumonia) Active Problems:   Dementia, vascular   Hypotension   Dehydration   Will admit to floor as DNR with IV fluids, IV ABX, and SVN's. Continue outpatient meds. Consult PT, ST, and CSW.  Diet: soft Fluids: 1/2 NS @100  DVT Prophylaxis: SQ Heparin  Code Status: DNR  Family Communication: none  Disposition Plan: SNF  Time spent: 50 min

## 2016-02-25 NOTE — Progress Notes (Signed)
Pt currently resting in bed, son at bedside. No SOB or distress at this time.

## 2016-02-26 ENCOUNTER — Inpatient Hospital Stay: Payer: Medicare Other

## 2016-02-26 LAB — COMPREHENSIVE METABOLIC PANEL
ALT: 16 U/L — ABNORMAL LOW (ref 17–63)
ANION GAP: 5 (ref 5–15)
AST: 14 U/L — ABNORMAL LOW (ref 15–41)
Albumin: 3.4 g/dL — ABNORMAL LOW (ref 3.5–5.0)
Alkaline Phosphatase: 67 U/L (ref 38–126)
BILIRUBIN TOTAL: 0.6 mg/dL (ref 0.3–1.2)
BUN: 18 mg/dL (ref 6–20)
CO2: 25 mmol/L (ref 22–32)
Calcium: 8.9 mg/dL (ref 8.9–10.3)
Chloride: 112 mmol/L — ABNORMAL HIGH (ref 101–111)
Creatinine, Ser: 1.17 mg/dL (ref 0.61–1.24)
GFR, EST NON AFRICAN AMERICAN: 52 mL/min — AB (ref >60)
Glucose, Bld: 91 mg/dL (ref 65–99)
POTASSIUM: 3.6 mmol/L (ref 3.5–5.1)
Sodium: 142 mmol/L (ref 135–145)
TOTAL PROTEIN: 6 g/dL — AB (ref 6.5–8.1)

## 2016-02-26 LAB — CBC
HCT: 33.7 % — ABNORMAL LOW (ref 40.0–52.0)
HEMOGLOBIN: 11.2 g/dL — AB (ref 13.0–18.0)
MCH: 31.8 pg (ref 26.0–34.0)
MCHC: 33.1 g/dL (ref 32.0–36.0)
MCV: 96 fL (ref 80.0–100.0)
Platelets: 160 10*3/uL (ref 150–440)
RBC: 3.52 MIL/uL — AB (ref 4.40–5.90)
RDW: 15.2 % — ABNORMAL HIGH (ref 11.5–14.5)
WBC: 6.1 10*3/uL (ref 3.8–10.6)

## 2016-02-26 LAB — URINE CULTURE: CULTURE: NO GROWTH

## 2016-02-26 MED ORDER — AMOXICILLIN-POT CLAVULANATE 875-125 MG PO TABS: 1.0000 | ORAL_TABLET | Freq: Two times a day (BID) | ORAL | Status: DC

## 2016-02-26 MED ORDER — AMLODIPINE BESYLATE 10 MG PO TABS
10.0000 mg | ORAL_TABLET | Freq: Every day | ORAL | Status: DC
  Administered 2016-02-26: 10 mg via ORAL
  Filled 2016-02-26: qty 1

## 2016-02-26 NOTE — Evaluation (Signed)
Physical Therapy Evaluation Patient Details Name: Alexander Goodwin MRN: [id number] DOB: 21-Feb-1921 Today's Date: 02/26/2016   History of Present Illness  82 yo male with onset of HCAP after admission to SNF, with weakness and hypotension.  Poor historian and has vascular dementia, chronic issues with PNA.  PMHx:  GERD, glaucoma, HTN, pancreatitis, stroke, TIA,  Clinical Impression  Pt was under the impression he still lives with his wife, although he is at [facility] for SNF care.  He will continue PT in acute setting to work on balance and gait endurance to reduce his fall risk, and will continue to monitor for fall risk as he is not mentally clear at all times.    Follow Up Recommendations Home health PT;Supervision/Assistance - 24 hour    Equipment Recommendations  None recommended by PT    Recommendations for Other Services Rehab consult     Precautions / Restrictions Precautions Precautions: Fall Precaution Comments: cued for tendency to stand without assistance Restrictions Weight Bearing Restrictions: No      Mobility  Bed Mobility Overal bed mobility: Needs Assistance Bed Mobility: Supine to Sit     Supine to sit: Min assist     General bed mobility comments: using assist with trunk and bedrail  Transfers Overall transfer level: Needs assistance Equipment used: Rolling walker (2 wheeled) Transfers: Sit to/from [facility] Sit to Stand: Min assist Stand pivot transfers: Min guard;Min assist       General transfer comment: reminders for hand placement and for safety with closeness to cahir to sit  Ambulation/Gait Ambulation/Gait assistance: Min assist Ambulation Distance (Feet): 40 Feet Assistive device: Rolling walker (2 wheeled);1 person hand held assist (Pt continually tried to set walker aside and wakl with no AD) Gait Pattern/deviations: Wide base of support;Trunk flexed;Shuffle;Decreased stride length;Step-through pattern Gait velocity:  reduced Gait velocity interpretation: Below normal speed for age/gender    Stairs            Wheelchair Mobility    Modified Rankin (Stroke Patients Only)       Balance Overall balance assessment: Needs assistance Sitting-balance support: Feet supported Sitting balance-Leahy Scale: Good   Postural control: Posterior lean Standing balance support: Bilateral upper extremity supported Standing balance-Leahy Scale: Fair                               Pertinent Vitals/Pain Pain Assessment: No/denies pain    Home Living Family/patient expects to be discharged to:: Assisted living                      Prior Function Level of Independence: Independent with assistive device(s)               Hand Dominance        Extremity/Trunk Assessment   Upper Extremity Assessment: Overall WFL for tasks assessed           Lower Extremity Assessment: Overall WFL for tasks assessed      Cervical / Trunk Assessment: Normal  Communication   Communication: No difficulties  Cognition Arousal/Alertness: Awake/alert Behavior During Therapy: WFL for tasks assessed/performed Overall Cognitive Status: History of cognitive impairments - at baseline       Memory: Decreased short-term memory              General Comments General comments (skin integrity, edema, etc.): Pt was able to maneuver into BR and gave pt assist to stand up but  was able to control standing balance at commode with wall rail and supervision    Exercises        Assessment/Plan    PT Assessment Patient needs continued PT services  PT Diagnosis Difficulty walking   PT Problem List Decreased strength;Decreased range of motion;Decreased activity tolerance;Decreased balance;Decreased mobility;Decreased coordination;Decreased knowledge of use of DME;Decreased safety awareness  PT Treatment Interventions DME instruction;Gait training;Functional mobility training;Therapeutic  activities;Therapeutic exercise;Balance training;Neuromuscular re-education;Patient/family education   PT Goals (Current goals can be found in the Care Plan section) Acute Rehab PT Goals Patient Stated Goal: get up to walk and to BR PT Goal Formulation: With patient Time For Goal Achievement: 03/04/16 Potential to Achieve Goals: Good    Frequency Min 2X/week   Barriers to discharge        Co-evaluation               End of Session Equipment Utilized During Treatment: Gait belt Activity Tolerance: Patient tolerated treatment well Patient left: in chair;with call bell/phone within reach;with chair alarm set Nurse Communication: Mobility status         Time: 1046-1106 PT Time Calculation (min) (ACUTE ONLY): 20 min   Charges:   PT Evaluation $PT Eval Low Complexity: 1 Procedure     PT G CodesRamond Dial 08-Mar-2016, 3:21 PM    Mee Hives, PT MS Acute Rehab Dept. Number: [address] and [place]

## 2016-02-26 NOTE — Progress Notes (Signed)
Patient is being discharged to home. Son bedside to drive. IVs removed, belongings packed and d/c instructions given. Son and patient acknowledged understanding of instructions.

## 2016-02-26 NOTE — Clinical Social Work Note (Signed)
CSW consulted for New SNF placement. Pt was admitted from [facility] and pt is ready for discharge today. Per MD, pt will return to [place]. RNCM is aware and following for discharge planning needs. CSW is signing off as no further needs identified.   Darden Dates, MSW, LCSW  Clinical Social Worker  [phone]

## 2016-02-26 NOTE — Discharge Summary (Signed)
[facility] [place] NAME: Alexander Goodwin    MR#:  [id number]  DATE OF BIRTH:  11-18-1920  DATE OF ADMISSION:  02/25/2016 ADMITTING PHYSICIAN: Idelle Crouch, MD  DATE OF DISCHARGE:02/26/2016  PRIMARY CARE PHYSICIAN: Coral Spikes, DO    ADMISSION DIAGNOSIS:  CAP (community acquired pneumonia) [J18.9] Sepsis, due to unspecified organism ([place]) [A41.9]  DISCHARGE DIAGNOSIS:  Principal Problem:   HCAP (healthcare-associated pneumonia) Active Problems:   Dementia, vascular   Hypotension   Dehydration   SECONDARY DIAGNOSIS:   Past Medical History  Diagnosis Date  . Diverticulitis of colon   . History of colon polyps   . Arthritis     right knee, hips, neck. He denies any major limitation in activities  . GERD (gastroesophageal reflux disease)     well controlled with medication. No nocturnal symptoms on a regular basis  . Glaucoma     both eyes, followed closely by Dr. Janyth Contes  . Esophagitis     Last EGD Fall '11   . Abscess of lung without pneumonia ([place])     Right upper lobe chronic lung abscess and prior left upper lobe lung abscess due to chronic aspiration  . Pancreatitis     resolved after GB surgery  . Stroke [facility])     TIAs - age 69  . BPH (benign prostatic hypertrophy) with urinary obstruction   . Hyperlipemia   . Hypertension   . Seasonal allergies   . Dysphagia, oropharyngeal   . Dementia     HOSPITAL COURSE:  82 year old male with vascular dementia who presented with hypotension from independent living to LAD.  1. Healthcare associated pneumonia: Chest x-ray does allude to possible pneumonia. Patient was a simple broad-spectrum antibiotics. Patient has had pneumonias in the past. In speaking with patient's son it does appear the patient has had follow-up for his chest x-rays. Patient may have underlying nodules or lung mass. No further workup for this. Patient may also have some aspiration and has seen speech  therapist in the past. Discharge on Augmentin.  2. Hypotension: Resolved with IV fluids.  3. Acute kidney injury due to dehydration: This is improved.  4. Vascular dementia: Continue Aricept.  5. History of hypertension: Blood pressure is now elevated and patient may resume Norvasc.    DISCHARGE CONDITIONS AND DIET:  Stable condition on regular dysphagia 3 diet with aspiration precautions  CONSULTS OBTAINED:     DRUG ALLERGIES:   Allergies  Allergen Reactions  . Clindamycin/Lincomycin Itching  . Erythromycin Base Rash    DISCHARGE MEDICATIONS:   Current Discharge Medication List    START taking these medications   Details  amoxicillin-clavulanate (AUGMENTIN) 875-125 MG tablet Take 1 tablet by mouth 2 (two) times daily. Qty: 16 tablet, Refills: 0      CONTINUE these medications which have NOT CHANGED   Details  acetaminophen (TYLENOL ARTHRITIS PAIN) 650 MG CR tablet Take 650 mg by mouth every 8 (eight) hours as needed for pain.     acidophilus (RISAQUAD) CAPS capsule Take 1 capsule by mouth daily.    amLODipine (NORVASC) 10 MG tablet Take 10 mg by mouth daily.      beta carotene w/minerals (OCUVITE) tablet Take 1 tablet by mouth daily.    brimonidine (ALPHAGAN P) 0.1 % SOLN Place 1 drop into both eyes 3 (three) times daily.     brinzolamide (AZOPT) 1 % ophthalmic suspension Place 1 drop into both eyes 3 (three) times daily.  calcium carbonate (TUMS - DOSED IN MG ELEMENTAL CALCIUM) 500 MG chewable tablet Chew 1 tablet by mouth as needed for indigestion or heartburn.    calcium-vitamin D (OSCAL 500/200 D-3) 500-200 MG-UNIT per tablet Take 1 tablet by mouth daily.     donepezil (ARICEPT) 10 MG tablet Take 10 mg by mouth at bedtime.    doxazosin (CARDURA) 4 MG tablet TAKE 1 TABLET BY MOUTH NIGHTLY Qty: 90 tablet, Refills: 0    gabapentin (NEURONTIN) 300 MG capsule TAKE ONE CAPSULE TWICE A DAY Qty: 180 capsule, Refills: 1    Iron, Ferrous Gluconate, 256 (28  FE) MG TABS Take 1 tablet by mouth daily.    latanoprost (XALATAN) 0.005 % ophthalmic solution Place 1 drop into both eyes at bedtime.     omeprazole (PRILOSEC) 20 MG capsule Take 20 mg by mouth 2 (two) times daily.    sucralfate (CARAFATE) 1 G tablet Take 1 tablet by mouth 2 (two) times daily before a meal. Refills: 0              Today   CHIEF COMPLAINT:   Patient doing well this morning. Patient would like to know when he can be discharged. Patient is missing his wife.   VITAL SIGNS:  Blood pressure 176/68, pulse 70, temperature 98.4 F (36.9 C), temperature source Oral, resp. rate 18, height 5\' 5"  (1.651 m), weight 73.528 kg (162 lb 1.6 oz), SpO2 95 %.   REVIEW OF SYSTEMS:  Review of Systems  Constitutional: Negative for fever, chills and malaise/fatigue.  HENT: Negative for ear discharge, ear pain, hearing loss, nosebleeds and sore throat.   Eyes: Negative for blurred vision and pain.  Respiratory: Negative for cough, hemoptysis, shortness of breath and wheezing.   Cardiovascular: Negative for chest pain, palpitations and leg swelling.  Gastrointestinal: Negative for nausea, vomiting, abdominal pain, diarrhea and blood in stool.  Genitourinary: Negative for dysuria.  Musculoskeletal: Negative for back pain.  Neurological: Negative for dizziness, tremors, speech change, focal weakness, seizures and headaches.  Endo/Heme/Allergies: Does not bruise/bleed easily.  Psychiatric/Behavioral: Positive for memory loss. Negative for depression, suicidal ideas and hallucinations.     PHYSICAL EXAMINATION:  GENERAL:  82 y.o.-year-old patient lying in the bed with no acute distress.  NECK:  Supple, no jugular venous distention. No thyroid enlargement, no tenderness.  LUNGS: Normal breath sounds bilaterally, no wheezing, rales,rhonchi  No use of accessory muscles of respiration.  CARDIOVASCULAR: S1, S2 normal. No murmurs, rubs, or gallops.  ABDOMEN: Soft, non-tender,  non-distended. Bowel sounds present. No organomegaly or mass.  EXTREMITIES: No pedal edema, cyanosis, or clubbing.  PSYCHIATRIC: The patient is alert and oriented x 2.  SKIN: No obvious rash, lesion, or ulcer.   DATA REVIEW:   CBC  Recent Labs Lab 02/26/16 0537  WBC 6.1  HGB 11.2*  HCT 33.7*  PLT 160    Chemistries   Recent Labs Lab 02/26/16 0537  NA 142  K 3.6  CL 112*  CO2 25  GLUCOSE 91  BUN 18  CREATININE 1.17  CALCIUM 8.9  AST 14*  ALT 16*  ALKPHOS 67  BILITOT 0.6    Cardiac Enzymes  Recent Labs Lab 02/25/16 1156  TROPONINI <0.03    Microbiology Results  @[email or web address] @  RADIOLOGY:  Dg Chest 2 View  02/25/2016  CLINICAL DATA:  Code sepsis. EXAM: CHEST  2 VIEW COMPARISON:  10/26/2015 chest radiograph and chest CT. FINDINGS: Stable cardiomediastinal silhouette with top-normal heart size and granulomatous calcified right paratracheal, subcarinal and  bilateral hilar lymph nodes. No pneumothorax. No significant pleural effusion. Focal patchy opacity in the right upper parahilar lung appears slightly increased. Focal patchy opacity in the anterior right mid lung appears slightly increased. Previously noted hazy right lower lobe opacity appears resolved. No consolidative airspace disease in the left lung. IMPRESSION: Indeterminate focal patchy opacities in the right upper parahilar and anterior right mid lungs, which appear slightly increased since 10/26/2015. Previously described patchy right lower lobe opacity appears resolved. The patient has had waxing and waning opacities in these locations on chest CT studies back to 2013 suggestive of a recurrent infectious or inflammatory process, with the differential including vasculitis. Neoplasm is not entirely excluded. Consider further evaluation with chest CT and/or short-term follow-up chest radiograph. Electronically Signed   By: Ilona Sorrel M.D.   On: 02/25/2016 12:26      Management plans discussed with the  patientAnd son and he is in agreement. Stable for discharge   Patient should follow up with PCP 1 week  CODE STATUS:     Code Status Orders        Start     Ordered   02/25/16 1505  Do not attempt resuscitation (DNR)   Continuous    Question Answer Comment  In the event of cardiac or respiratory ARREST Do not call a "code blue"   In the event of cardiac or respiratory ARREST Do not perform Intubation, CPR, defibrillation or ACLS   In the event of cardiac or respiratory ARREST Use medication by any route, position, wound care, and other measures to relive pain and suffering. May use oxygen, suction and manual treatment of airway obstruction as needed for comfort.      02/25/16 1504    Code Status History    Date Active Date Inactive Code Status Order ID Comments User Context   10/26/2015 11:42 PM 10/29/2015  2:02 PM DNR [id number]  Lance Coon, MD ED   10/16/2015  2:44 AM 10/19/2015  6:09 PM DNR [id number]  Lance Coon, MD ED   02/06/2015 10:41 PM 02/07/2015  6:08 PM DNR [id number]  Lytle Butte, MD ED      TOTAL TIME TAKING CARE OF THIS PATIENT: 35 minutes.    Note: This dictation was prepared with Dragon dictation along with smaller phrase technology. Any transcriptional errors that result from this process are unintentional.  Sophia Cubero M.D on 02/26/2016 at 9:02 AM  Between 7am to 6pm - Pager - [phone] After 6pm go to [email or web address] - password [email or web address] [facility]  Office  [phone]  CC: Primary care physician; Coral Spikes, DO

## 2016-02-27 ENCOUNTER — Telehealth: Payer: Self-pay | Admitting: Family Medicine

## 2016-02-27 ENCOUNTER — Encounter
Admission: RE | Admit: 2016-02-27 | Discharge: 2016-02-27 | Disposition: A | Discharge status: Home / Self Care | Payer: Medicare Other | Source: Ambulatory Visit | Attending: Internal Medicine | Admitting: Internal Medicine

## 2016-02-27 ENCOUNTER — Other Ambulatory Visit: Payer: Self-pay | Admitting: Family Medicine

## 2016-02-27 NOTE — Telephone Encounter (Signed)
Lori from [facility] was asking if you could place order for rehab for pt who was just discharged from hospital. Pt is not doing to well. Please advise orders need to be faxed today if possible. Number: [phone] attention Gara Kroner.

## 2016-02-27 NOTE — Telephone Encounter (Signed)
Has been faxed.

## 2016-02-27 NOTE — Telephone Encounter (Signed)
Fax Rx for me please.  

## 2016-02-28 DIAGNOSIS — F0150 Vascular dementia without behavioral disturbance: Secondary | ICD-10-CM | POA: Diagnosis not present

## 2016-02-28 DIAGNOSIS — R531 Weakness: Secondary | ICD-10-CM | POA: Diagnosis not present

## 2016-02-28 DIAGNOSIS — J9600 Acute respiratory failure, unspecified whether with hypoxia or hypercapnia: Secondary | ICD-10-CM | POA: Diagnosis not present

## 2016-02-29 ENCOUNTER — Non-Acute Institutional Stay (SKILLED_NURSING_FACILITY): Payer: Medicare Other | Admitting: Gerontology

## 2016-02-29 DIAGNOSIS — J189 Pneumonia, unspecified organism: Secondary | ICD-10-CM

## 2016-02-29 NOTE — Progress Notes (Signed)
Location:  [facility] Service:  SNF [phone])  Provider: Toni Arthurs, NP-C  PCP: Coral Spikes, DO Patient Care Team: Coral Spikes, DO as PCP - General (Family Medicine) Richmond Campbell, MD (Gastroenterology) Orion Crook, MD (Urology) Prentiss Bells, MD (Ophthalmology) Allyn Kenner, MD (Dermatology) Netta Cedars, MD (Orthopedic Surgery) Elsie Stain, MD as Attending Physician (Pulmonary Disease)  Extended Emergency Contact Information Primary Emergency Contact: Birkhead,Margaret Address: [address] [place]          [place], [place] [address] Johnnette Litter of [place] Phone: [phone] Relation: Spouse Secondary Emergency Contact: Rathbun,Gary And Leda Gauze Address: [address]          [place], [place] [address] Johnnette Litter of [place] Phone: [phone] Mobile Phone: [phone] Relation: Son  Code Status: Full    Goals of care:  Advanced Directive information Advanced Directives 02/25/2016  Does patient have an advance directive? No  Type of Advance Directive -  Copy of advanced directive(s) in chart? -  Would patient like information on creating an advanced directive? No - patient declined information  Pre-existing out of facility DNR order (yellow form or pink MOST form) -     Allergies  Allergen Reactions  . Clindamycin/Lincomycin Itching  . Erythromycin Base Rash    Chief Complaint  Patient presents with  . Discharge Note    HPI:  82 y.o. male at the facility for respite, short term skill nursing care. He lives with his wife in the [place]. Wife wants to take him home/ discharge him tonight. However, pt has a h/o lung abscesses r/t chronic aspiration. Wife agreeable to pt staying another night to allow for evaluation from SLP. Pt is confused. Was found wandering around on 2 nd floor, stated he "was looking for his room." Later he did recognize that he was lost. Pt is unstable on his feet. Pt denies  pain, dyspnea.      Past Medical History  Diagnosis Date  . Diverticulitis of colon   . History of colon polyps   . Arthritis     right knee, hips, neck. He denies any major limitation in activities  . GERD (gastroesophageal reflux disease)     well controlled with medication. No nocturnal symptoms on a regular basis  . Glaucoma     both eyes, followed closely by Dr. Janyth Contes  . Esophagitis     Last EGD Fall '11   . Abscess of lung without pneumonia ([place])     Right upper lobe chronic lung abscess and prior left upper lobe lung abscess due to chronic aspiration  . Pancreatitis     resolved after GB surgery  . Stroke [facility])     TIAs - age 89  . BPH (benign prostatic hypertrophy) with urinary obstruction   . Hyperlipemia   . Hypertension   . Seasonal allergies   . Dysphagia, oropharyngeal   . Dementia     Past Surgical History  Procedure Laterality Date  . Cholecystectomy      '88 - laparotomy ([place])  . Appendectomy      '88 along with GB  . Bronchoscopy  1993  . Video bronchoscopy  03/26/2012    Procedure: VIDEO BRONCHOSCOPY WITH FLUORO;  Surgeon: Elsie Stain, MD;  Location: [facility];  Service: Cardiopulmonary;  Laterality: Bilateral;      reports that he quit smoking about 37 years ago. His smoking use included Pipe and Cigars. He quit  smokeless tobacco use about 25 years ago. His smokeless tobacco use included Chew. He reports that he does not drink alcohol or use illicit drugs. Social History   Social History  . Marital Status: Married    Spouse Name: N/A  . Number of Children: 3  . Years of Education: HSG   Occupational History  . 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 Smoking status: Former Smoker -- 0.50 packs/day for 30 years    Types: Pipe, Cigars    Quit date: 08/20/1978  . Smokeless tobacco: Former Systems developer    Types: Chew    Quit date: 11/16/1990  . Alcohol Use: No  . Drug Use: No    . Sexual Activity: Yes   Other Topics Concern  . Not on file   Social History Narrative   HSG.   Married 1948. 3 sons - '52, '57,'58- 5 grandchildren, one deceased at 53 - OD.  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 Marriage in good health. End of Life Care -DNR/DNI,  No prolonged heroic or futile measures.    Functional Status Survey:    Allergies  Allergen Reactions  . Clindamycin/Lincomycin Itching  . Erythromycin Base Rash    Pertinent  Health Maintenance Due  Topic Date Due  . INFLUENZA VACCINE  07/17/2016 (Originally 03/20/2016)  . PNA vac Low Risk Adult  Completed    Medications:   Medication List       This list is accurate as of: 02/29/16 11:02 PM.  Always use your most recent med list.               acidophilus Caps capsule  Take 1 capsule by mouth daily.     amLODipine 10 MG tablet  Commonly known as:  NORVASC  Take 10 mg by mouth daily.     amoxicillin-clavulanate 875-125 MG tablet  Commonly known as:  AUGMENTIN  Take 1 tablet by mouth 2 (two) times daily.     beta carotene w/minerals tablet  Take 1 tablet by mouth daily.     brimonidine 0.1 % Soln  Commonly known as:  ALPHAGAN P  Place 1 drop into both eyes 3 (three) times daily.     brinzolamide 1 % ophthalmic suspension  Commonly known as:  AZOPT  Place 1 drop into both eyes 3 (three) times daily.     calcium carbonate 500 MG chewable tablet  Commonly known as:  TUMS - dosed in mg elemental calcium  Chew 1 tablet by mouth as needed for indigestion or heartburn.     donepezil 10 MG tablet  Commonly known as:  ARICEPT  Take 10 mg by mouth at bedtime.     doxazosin 4 MG tablet  Commonly known as:  CARDURA  TAKE 1 TABLET BY MOUTH NIGHTLY     gabapentin 300 MG capsule  Commonly known as:  NEURONTIN  TAKE ONE CAPSULE TWICE A DAY     Iron (Ferrous Gluconate) 256 (28 Fe) MG Tabs  Take 1 tablet by mouth daily.     latanoprost 0.005 % ophthalmic solution  Commonly  known as:  XALATAN  Place 1 drop into both eyes at bedtime.     omeprazole 20 MG capsule  Commonly known as:  PRILOSEC  Take 20 mg by mouth 2 (two) times daily.     OSCAL 500/200 D-3 500-200 MG-UNIT tablet  Generic drug:  calcium-vitamin D  Take 1 tablet by mouth daily.     sucralfate 1 g tablet  Commonly known as:  CARAFATE  Take 1 tablet by mouth 2 (two) times daily before a meal.     TYLENOL ARTHRITIS PAIN 650 MG CR tablet  Generic drug:  acetaminophen  Take 650 mg by mouth every 8 (eight) hours as needed for pain.        Review of Systems  Unable to perform ROS: Dementia  Respiratory: Negative for cough, chest tightness and shortness of breath.   Cardiovascular: Negative for chest pain.  Gastrointestinal: Negative.   Genitourinary: Negative.   Musculoskeletal: Negative.   Skin: Negative.   Neurological: Positive for weakness.  Psychiatric/Behavioral: Negative.     Filed Vitals:   02/29/16 2259  BP: 126/53  Pulse: 59  Temp: 97.7 F (36.5 C)  Resp: 20  SpO2: 96%   There is no weight on file to calculate BMI. Physical Exam  Constitutional: He appears well-developed and well-nourished. He appears distressed.  HENT:  Head: Normocephalic and atraumatic.  Eyes: Conjunctivae and EOM are normal. Pupils are equal, round, and reactive to light.  Neck: Normal range of motion. Neck supple. No JVD present. No tracheal deviation present.  Cardiovascular: Normal rate, regular rhythm and intact distal pulses.  Exam reveals friction rub. Exam reveals no gallop.   No murmur heard. Pulmonary/Chest: Effort normal and breath sounds normal. No respiratory distress. He has no wheezes. He has no rales. He exhibits no tenderness.  Abdominal: Soft. Bowel sounds are normal. He exhibits no distension. There is no tenderness. There is no guarding.  Musculoskeletal:  Generalized weakness, unsteady gait; kyphosis  Lymphadenopathy:    He has no cervical adenopathy.  Neurological: He is  alert.  Skin: Skin is warm and dry. No rash noted. He is not diaphoretic. No erythema. No pallor.  Psychiatric: He has a normal mood and affect. His behavior is normal. Judgment and thought content normal.  Nursing note and vitals reviewed.   Labs reviewed: Basic Metabolic Panel:  Recent Labs  11/04/15 1119 02/25/16 1154 02/26/16 0537  NA 137 140 142  K 4.3 4.2 3.6  CL 104 106 112*  CO2 27 25 25   GLUCOSE 92 148* 91  BUN 20 23* 18  CREATININE 1.17 1.45* 1.17  CALCIUM 9.2 9.6 8.9   Liver Function Tests:  Recent Labs  11/04/15 1119 02/25/16 1154 02/26/16 0537  AST 18 22 14*  ALT 19 20 16*  ALKPHOS 77 90 67  BILITOT 0.4 0.6 0.6  PROT 6.3 6.9 6.0*  ALBUMIN 3.3* 3.9 3.4*    Recent Labs  10/26/15 1215 02/25/16 1156  LIPASE 16 21   No results for input(s): AMMONIA in the last 8760 hours. CBC:  Recent Labs  10/18/15 0455 10/26/15 1215  11/04/15 1119 02/25/16 1154 02/26/16 0537  WBC 9.2 30.7*  < > 6.2 6.9 6.1  NEUTROABS 8.4* 25.4*  --   --  3.7  --   HGB 9.5* 9.0*  < > 9.4* 13.0 11.2*  HCT 28.6* 27.9*  < > 28.5* 37.9* 33.7*  MCV 97.7 96.8  < > 96.5 93.8 96.0  PLT 158 237  < > 211.0 192 160  < > = values in this interval not displayed. Cardiac Enzymes:  Recent Labs  02/25/16 1156  TROPONINI <0.03   BNP: Invalid input(s): POCBNP CBG: No results for input(s): GLUCAP in the last 8760 hours.  Procedures and Imaging Studies During Stay: X-ray Chest Pa And Lateral  02/26/2016  CLINICAL DATA:  82 year old male admitted yesterday for code sepsis. Recurrent patchy right lung opacity on chest radiographs yesterday. Initial encounter. EXAM: CHEST  2 VIEW COMPARISON:  02/25/2016 chest radiographs. Chest CT 10/26/2015 and earlier. FINDINGS: Seated AP and lateral views of the chest. Stable lung volumes. Stable patchy right perihilar multilobar opacity since yesterday. No superimposed pleural effusion, pulmonary edema or pneumothorax. No new pulmonary opacity. Stable  cardiac size and mediastinal contours. Osteopenia. Stable visualized osseous structures. IMPRESSION: Abnormal right lung multilobar opacity described yesterday is unchanged. Please see that report regarding differential considerations and recommendations. No new cardiopulmonary abnormality. Electronically Signed   By: Genevie Ann M.D.   On: 02/26/2016 09:07   Dg Chest 2 View  02/25/2016  CLINICAL DATA:  Code sepsis. EXAM: CHEST  2 VIEW COMPARISON:  10/26/2015 chest radiograph and chest CT. FINDINGS: Stable cardiomediastinal silhouette with top-normal heart size and granulomatous calcified right paratracheal, subcarinal and bilateral hilar lymph nodes. No pneumothorax. No significant pleural effusion. Focal patchy opacity in the right upper parahilar lung appears slightly increased. Focal patchy opacity in the anterior right mid lung appears slightly increased. Previously noted hazy right lower lobe opacity appears resolved. No consolidative airspace disease in the left lung. IMPRESSION: Indeterminate focal patchy opacities in the right upper parahilar and anterior right mid lungs, which appear slightly increased since 10/26/2015. Previously described patchy right lower lobe opacity appears resolved. The patient has had waxing and waning opacities in these locations on chest CT studies back to 2013 suggestive of a recurrent infectious or inflammatory process, with the differential including vasculitis. Neoplasm is not entirely excluded. Consider further evaluation with chest CT and/or short-term follow-up chest radiograph. Electronically Signed   By: Ilona Sorrel M.D.   On: 02/25/2016 12:26    Assessment/Plan:   1. HCAP (healthcare-associated pneumonia)  Continue po Augmentin as ordered- complete course  SLP evaluation  Wife wasn't to take pt home/ discharge from facility. Agreed to stay another night to allow for ALP eval.  Likely DC home in am  FU with PCP soon    Patient is being discharged with  the following home health services:  none  Patient is being discharged with the following durable medical equipment:  none  Patient has been advised to f/u with their PCP in 1-2 weeks to bring them up to date on their rehab stay.  Social services at facility was responsible for arranging this appointment.  Pt was provided with a 30 day supply of prescriptions for medications and refills must be obtained from their PCP.  For controlled substances, a more limited supply may be provided adequate until PCP appointment only.  Future labs/tests needed: per PCP   Vikki Ports, NP-C [facility] [phone], [place] [address] Cell Phone (Mon-Fri 8am-5pm):  [phone] On Call:  [phone] & follow prompts after 5pm & weekends Office Phone:  [phone] Office Fax:  [phone]

## 2016-03-01 LAB — CULTURE, BLOOD (ROUTINE X 2)
CULTURE: NO GROWTH
Culture: NO GROWTH

## 2016-03-07 DIAGNOSIS — X32XXXA Exposure to sunlight, initial encounter: Secondary | ICD-10-CM | POA: Diagnosis not present

## 2016-03-07 DIAGNOSIS — Z85828 Personal history of other malignant neoplasm of skin: Secondary | ICD-10-CM | POA: Diagnosis not present

## 2016-03-07 DIAGNOSIS — C44329 Squamous cell carcinoma of skin of other parts of face: Secondary | ICD-10-CM | POA: Diagnosis not present

## 2016-03-07 DIAGNOSIS — D485 Neoplasm of uncertain behavior of skin: Secondary | ICD-10-CM | POA: Diagnosis not present

## 2016-03-07 DIAGNOSIS — L570 Actinic keratosis: Secondary | ICD-10-CM | POA: Diagnosis not present

## 2016-03-07 DIAGNOSIS — L821 Other seborrheic keratosis: Secondary | ICD-10-CM | POA: Diagnosis not present

## 2016-03-12 ENCOUNTER — Encounter: Payer: Self-pay | Admitting: Family Medicine

## 2016-03-12 ENCOUNTER — Ambulatory Visit (INDEPENDENT_AMBULATORY_CARE_PROVIDER_SITE_OTHER): Payer: Medicare Other | Admitting: Family Medicine

## 2016-03-12 ENCOUNTER — Ambulatory Visit (INDEPENDENT_AMBULATORY_CARE_PROVIDER_SITE_OTHER): Payer: Medicare Other

## 2016-03-12 VITALS — BP 136/78 | HR 66 | Temp 97.6°F | Wt 159.4 lb

## 2016-03-12 DIAGNOSIS — J189 Pneumonia, unspecified organism: Secondary | ICD-10-CM | POA: Diagnosis not present

## 2016-03-12 NOTE — Patient Instructions (Addendum)
Dysphagia Diet Level 2, Mechanically Altered The dysphagia level 2 diet includes foods that are blended, chopped, ground, or mashed so they are easier to chew and swallow. The foods are soft, moist, and can be chopped into -inch chunks (such as pancakes, pasta, and bananas). In order to be on this diet, you must be able to chew. This diet helps you transition between the pureed textures of the dysphagia level 1 diet to more solid textures. This diet is helpful for people with mild to moderate swallowing difficulties. It reduces the risk of food getting caught in the windpipe, trachea, or lungs.  You may need help or supervision during meals while following this diet so that you eat safely. You will be on this diet until your health care provider advances the texture of your diet.  WHAT DO I NEED TO KNOW ABOUT THIS DIET? Foods  You may eat foods that are soft and moist.  You may need to use a blender, whisk, or masher to soften some of your foods.  You can moisten foods with gravies, sauces, vegetable or fruit juice, milk, half and half, or water when blending, mashing, or grinding your foods to the right consistency.  If you were on the dysphagia level 1 diet, you may still eat any of the foods included in that diet.  Avoid foods that are dry, hard, sticky, chewy, coarse, and crunchy. Also avoid large cuts of food.  Take small bites. Each bite should contain  inch or less of food. Liquids  Avoid liquids with seeds and chunks.  Thicken liquids, if instructed by your health care provider. Your health care provider will tell you the consistency to which you should thicken your liquids for safe swallowing. To thicken a liquid, use a commercial thickener or a thickening food (such as rice cereal or potato flakes). Ask your health care provider for specific recommendations on thickeners. See your dietitian or health care provider regularly for help with your dietary changes. WHAT FOODS CAN I  EAT? Grains Store-bought soft breads that do not have nuts or seeds. Pancakes, sweet rolls, [place] pastries, and [place] toast that have been moistened with syrup or sauce to form a slurry when blended. Well-cooked pasta, noodles, and bread dressing. Well-cooked noodles and pasta in sauce. Moist macaroni and cheese. Soft dumplings or spaetzle with gravy or butter. Cooked cereals (including oatmeal). Low-texture dry cereals, such as rice puff, corn, or wheat-flake cereals, with milk (if thin liquids are not allowed, make sure all of the milk is absorbed by the cereal before eating it). Vegetables Very soft, well-cooked vegetables in pieces less than  inch in size. Cooked potatoes that are moist, not crispy, and with sauce. Fruits Canned or cooked fruits that are soft or moist and do not have skin or seeds. Fresh, soft bananas. Fruit juices with a small amount of pulp (if thin liquids are allowed). Gelatin or plain gelatin with canned fruit, except pineapple. Meat and Other Protein Sources Tender, moist meats, poultry, or fish cooked with gravy or sauce and cubed to -inch bites or smaller. Ground meat. Moist meatball or meatloaf. Fish without bones. Moist casseroles without rice. Tuna, egg, or meat salad without chunks or hard-to-chew vegetables, such as celery and onions. Smooth quiche without large chunks. Scrambled, poached, or soft-cooked eggs with butter, margarine, sauce, or gravy. Tofu. Well-cooked, moistened and mashed beans, peas, baked beans, and other legumes. Casseroles without rice (such as tuna noodle casserole or soft moist meat lasagna). Dairy Cream cheese.  Yogurt. Cottage cheese. Ask your health care provider if milk is allowed. Sweets/Desserts Pudding. Custard. Soft fruit pies with crust on the bottom only. Crisps and cobblers without seeds or nuts and with soft crusts. Soft, moist cakes. Icing. Pre-gelled cookies. Soft, moist cookies dunked in milk, coffee, or another liquid. Jelly.  Soft, smooth chocolate bars that are easily chewed. Jams and preserves without seeds. Ask your health care provider whether you can have frozen desserts. Fats and Oils Butter. Margarine. Cream for cereal, depending on liquid consistency allowed. Gravy. Cream sauces. Mayonnaise. Salad dressings. Cream cheese. Cheese spreads, plain or with soft fruits or vegetables added. Sour cream. Sour cream dips with soft fruits or vegetables added. Whipped toppings. Other Sauces and salsas that have soft chunks that are about  inch or smaller. The items listed above may not be a complete list of recommended foods or beverages. Contact your dietitian for more options. WHAT FOODS ARE NOT RECOMMENDED? Grains All breads not listed in the recommended list. Breads that are hard or have nuts or seeds. Coarse cereals. Cereals that have nuts, seeds, dried fruits, or coconut. Rice. Corn. Vegetables Whole, raw, frozen, or dried vegetables. Tough, fibrous, chewy, or stringy cooked vegetables, such as celery, peas, broccoli, cabbage, Brussels sprouts, and asparagus. Potato skins. Potato and other vegetable chips. Fried or French-fried potatoes. Cooked corn and peas. Fruits Whole raw, frozen, or dried fruits, including coconut. Pineapple. Fruits with seeds. Meat and Other Protein Sources Dry, tough meats, such as bacon, sausage, and hot dogs. Cheese slices and cubes. Peanut butter. Hard boiled or fried eggs. Nuts. Seeds. Pizza. Sandwiches. Dry casseroles or casseroles with rice or large chunks. Dairy Yogurt with nuts, seeds, or large chunks. Sweets/Desserts Coarse, hard, chewy, or sticky desserts. Any dessert with nuts, seeds, coconut, pineapple, or dried fruit. Ask your health care provider whether you can have frozen desserts. Fats and Oils Avoid fats with chunky, large textures, such as those with nuts or fruits. Other Soups and casseroles with large chunks. The items listed above may not be a complete list of foods  and beverages to avoid. Contact your dietitian for more information.   This information is not intended to replace advice given to you by your health care provider. Make sure you discuss any questions you have with your health care provider.   Document Released: 08/06/2005 Document Revised: 08/27/2014 Document Reviewed: 07/20/2013 Elsevier Interactive Patient Education [employer].  Follow up in 3 months.  Take care  Dr. Lacinda Axon

## 2016-03-12 NOTE — Progress Notes (Signed)
Subjective:  Patient ID: Alexander Goodwin, male    DOB: Dec 16, 1920  Age: 82 y.o. MRN: [id number]  CC: Follow up, recent admission for HCAP  HPI:  82 year old male with a PMH of TIA, HTN, HLD, chronic aspiration, vascular dementia presents for follow up.  Patient presented to the ER on 7/8. He was sent by his RN at the [facility] for weakness and hypotension. He was found to have elevated lactic acid and acute kidney injury.x-ray was obtained and revealed focal opacities in the right lung. Patient was treated with broad-spectrum antibiotics and did well during admission. He was also given IV fluids. Patient was transitioned to Augmentin and creatinine improved with IV hydration. Discharged home in stable condition.  Patient resents today for follow-up. He states that he's doing fine. He does report some abdominal discomfort. His wife states that he often has a cough particularly after meals. He has not particular compliant with a dysphasia diet. No recent fevers or chills. No other complaints at this time.   Social Hx   Social History   Social History  . Marital status: Married    Spouse name: N/A  . Number of children: 3  . Years of education: HSG   Occupational History  . 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 Smoking status: Former Smoker    Packs/day: 0.50    Years: 30.00    Types: Pipe, Cigars    Quit date: 08/20/1978  . Smokeless tobacco: Former Systems developer    Types: Chew    Quit date: 11/16/1990  . Alcohol use No  . Drug use: No  . Sexual activity: Yes   Other Topics Concern  . None   Social History Narrative   HSG.   Married 1948. 3 sons - '52, '57,'58- 5 grandchildren, one deceased at 48 - OD.  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 Marriage in good health. End of Life Care -DNR/DNI,  No prolonged heroic or futile measures.    Review of Systems  Constitutional: Negative.     Respiratory: Positive for cough.     Objective:  BP 136/78 (BP Location: Left Arm, Patient Position: Sitting, Cuff Size: Normal)   Pulse 66   Temp 97.6 F (36.4 C) (Oral)   Wt 159 lb 6 oz (72.3 kg)   SpO2 97%   BMI 26.52 kg/m   BP/Weight 03/12/2016 [id number]  Systolic BP [id number]  Diastolic BP 78 53 68  Wt. (Lbs) 159.38 - 162.1  BMI 26.52 - 26.97   Physical Exam  Constitutional:  Chronically ill-appearing male in no acute distress.  Cardiovascular: Normal rate and regular rhythm.   Pulmonary/Chest: Effort normal.  Coarse breath sounds (right).  Abdominal: Soft. He exhibits no distension. There is no tenderness.  Neurological: He is alert.  Psychiatric: He has a normal mood and affect.  Vitals reviewed.  Lab Results  Component Value Date   WBC 6.1 02/26/2016   HGB 11.2 (L) 02/26/2016   HCT 33.7 (L) 02/26/2016   PLT 160 02/26/2016   GLUCOSE 91 02/26/2016   CHOL 143 10/07/2013   TRIG 79 10/07/2013   HDL 44 10/07/2013   LDLCALC 83 10/07/2013   ALT 16 (L) 02/26/2016   AST 14 (L) 02/26/2016   NA 142 02/26/2016   K 3.6 02/26/2016   CL 112 (H) 02/26/2016   CREATININE 1.17 02/26/2016  BUN 18 02/26/2016   CO2 25 02/26/2016   INR 1.21 02/25/2016    Assessment & Plan:   Problem List Items Addressed This Visit    HCAP (healthcare-associated pneumonia) - Primary    New problem. Hospital course reviewed.  Has completed course of Augmentin. Repeating chest xray today.       Relevant Orders   DG Chest 2 View (Completed)    Other Visit Diagnoses   None.     Follow-up: 3 months  [place]

## 2016-03-12 NOTE — Assessment & Plan Note (Signed)
New problem. Hospital course reviewed.  Has completed course of Augmentin. Repeating chest xray today.

## 2016-03-29 DIAGNOSIS — K219 Gastro-esophageal reflux disease without esophagitis: Secondary | ICD-10-CM | POA: Diagnosis not present

## 2016-04-30 DIAGNOSIS — L905 Scar conditions and fibrosis of skin: Secondary | ICD-10-CM | POA: Diagnosis not present

## 2016-04-30 DIAGNOSIS — C44329 Squamous cell carcinoma of skin of other parts of face: Secondary | ICD-10-CM | POA: Diagnosis not present

## 2016-05-07 DIAGNOSIS — H401133 Primary open-angle glaucoma, bilateral, severe stage: Secondary | ICD-10-CM | POA: Diagnosis not present

## 2016-06-04 DIAGNOSIS — H401133 Primary open-angle glaucoma, bilateral, severe stage: Secondary | ICD-10-CM | POA: Diagnosis not present

## 2016-06-21 DIAGNOSIS — Z23 Encounter for immunization: Secondary | ICD-10-CM | POA: Diagnosis not present

## 2016-07-02 ENCOUNTER — Telehealth: Payer: Self-pay | Admitting: Family Medicine

## 2016-07-02 NOTE — Telephone Encounter (Signed)
Lori from [employer] dropped off FL2 forms to be filled out.. Placed in Dr. Geralynn Ochs box up front.. Please contact Lori @ [phone] when completed

## 2016-07-02 NOTE — Telephone Encounter (Signed)
Placed in Dr.Cooks quick sign folder to be filled out.

## 2016-07-03 NOTE — Telephone Encounter (Signed)
lvm with lori the Rn who dropped of form and told her it was ready for pick up.

## 2016-07-03 NOTE — Telephone Encounter (Signed)
Gave to [employer].

## 2016-07-08 ENCOUNTER — Other Ambulatory Visit: Payer: Self-pay | Admitting: Family Medicine

## 2016-07-08 MED ORDER — CLONAZEPAM 0.5 MG PO TABS: 0.5000 mg | ORAL_TABLET | Freq: Two times a day (BID) | ORAL | 0 refills | Status: DC | PRN

## 2016-07-08 NOTE — Progress Notes (Signed)
I received a call from [facility] indicating that this pt's wife has passed away and he is distraught.  Facility and family report he is pacing the floor and unable to calm down.  Asking for medication to help w/ anxiety.  Tried to call [facility] to send in Clonazepam 0.5mg  BID prn anxiety but they are not open until 10am and I would like to speak directly to pharmacy staff rather than leave a message.  Notified [employer] that there would be a delay and asked her to review the risks of Benzos in the elderly w/ the family- particularly since he has dementia (falls, worsening confusion, sedation).  Will call pharmacy back at [place].

## 2016-07-10 ENCOUNTER — Telehealth: Payer: Self-pay

## 2016-07-10 NOTE — Telephone Encounter (Signed)
PA for Clonazepam completed on cover my meds.

## 2016-07-11 ENCOUNTER — Telehealth: Payer: Self-pay | Admitting: Family Medicine

## 2016-07-11 NOTE — Telephone Encounter (Signed)
[employer] called and stated that the PA for clonazePAM (KLONOPIN) 0.5 MG tablet was approved for 1 year. From 07/10/16 to 07/10/17.

## 2016-07-11 NOTE — Telephone Encounter (Signed)
Pt was called and son was given information about medication being ready.

## 2016-07-18 ENCOUNTER — Ambulatory Visit: Payer: Medicare Other | Admitting: Family Medicine

## 2016-07-19 ENCOUNTER — Ambulatory Visit: Payer: Medicare Other | Admitting: Family Medicine

## 2016-07-19 ENCOUNTER — Ambulatory Visit (INDEPENDENT_AMBULATORY_CARE_PROVIDER_SITE_OTHER): Payer: Medicare Other | Admitting: Family Medicine

## 2016-07-19 ENCOUNTER — Encounter: Payer: Self-pay | Admitting: Family Medicine

## 2016-07-19 ENCOUNTER — Encounter (INDEPENDENT_AMBULATORY_CARE_PROVIDER_SITE_OTHER): Payer: Self-pay

## 2016-07-19 DIAGNOSIS — F0150 Vascular dementia without behavioral disturbance: Secondary | ICD-10-CM

## 2016-07-19 DIAGNOSIS — F4321 Adjustment disorder with depressed mood: Secondary | ICD-10-CM

## 2016-07-19 DIAGNOSIS — I10 Essential (primary) hypertension: Secondary | ICD-10-CM | POA: Diagnosis not present

## 2016-07-19 DIAGNOSIS — F4320 Adjustment disorder, unspecified: Secondary | ICD-10-CM | POA: Diagnosis not present

## 2016-07-19 NOTE — Assessment & Plan Note (Signed)
Doing well.  Will continue to monitor

## 2016-07-19 NOTE — Progress Notes (Signed)
Subjective:  Patient ID: Alexander Goodwin, male    DOB: 1921/07/28  Age: 82 y.o. MRN: [id number]  CC: Follow up, Needs FL2 filled out  HPI:  82 year old male with hypertension, hyperlipidemia, vascular dementia, history of right upper lobe lung abscess/chronic aspiration presents for follow-up. He is in need of an FL2 to be filled out.  Dementia  Stable.  Has some sundowning.  Is still able to care for himself without difficulty.  HTN  BP elevated today.  He is compliant with norvasc and cardura.  Recent death of wife, Grief  Seems to be doing well.  Grieving appropriately. Seems in fairly good spirits. Has family support.  They are looking for other living options at this time, possibly assisted living. This is in efforts of getting him closer to family members.  Social Hx   Social History   Social History  . Marital status: Married    Spouse name: N/A  . Number of children: 3  . Years of education: HSG   Occupational History  . 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 Smoking status: Former Smoker    Packs/day: 0.50    Years: 30.00    Types: Pipe, Cigars    Quit date: 08/20/1978  . Smokeless tobacco: Former Systems developer    Types: Chew    Quit date: 11/16/1990  . Alcohol use No  . Drug use: No  . Sexual activity: Yes   Other Topics Concern  . None   Social History Narrative   HSG.   Married 1948. 3 sons - '52, '57,'58- 5 grandchildren, one deceased at 37 - OD.  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 Marriage in good health. End of Life Care -DNR/DNI,  No prolonged heroic or futile measures.     Review of Systems  Constitutional: Negative.   Respiratory: Negative.   Psychiatric/Behavioral: Positive for confusion.   Objective:  BP (!) 168/80 (BP Location: Left Arm, Patient Position: Sitting, Cuff Size: Normal)   Pulse 73   Temp 98.1 F (36.7 C) (Oral)   Resp 14   Wt 170 lb 6  oz (77.3 kg)   SpO2 94%   BMI 28.35 kg/m   BP/Weight 07/19/2016 03/12/2016 [id number]  Systolic BP [id number]  Diastolic BP 80 78 53  Wt. (Lbs) 170.38 159.38 -  BMI 28.35 26.52 -    Physical Exam  Constitutional: He appears well-developed. No distress.  Cardiovascular: Normal rate and regular rhythm.   Pulmonary/Chest: Effort normal and breath sounds normal.  Neurological: He is alert.  Psychiatric:  In good spirits. Normal mood and affect.  Vitals reviewed.  Lab Results  Component Value Date   WBC 6.1 02/26/2016   HGB 11.2 (L) 02/26/2016   HCT 33.7 (L) 02/26/2016   PLT 160 02/26/2016   GLUCOSE 91 02/26/2016   CHOL 143 10/07/2013   TRIG 79 10/07/2013   HDL 44 10/07/2013   LDLCALC 83 10/07/2013   ALT 16 (L) 02/26/2016   AST 14 (L) 02/26/2016   NA 142 02/26/2016   K 3.6 02/26/2016   CL 112 (H) 02/26/2016   CREATININE 1.17 02/26/2016   BUN 18 02/26/2016   CO2 25 02/26/2016   INR 1.21 02/25/2016    Assessment & Plan:   Problem List Items Addressed This Visit    Hypertension    BP elevated today. Will continue to monitor closely (I prefer not  to increase his medication given his advanced age at this time).       Grief    Doing well. Will continue to monitor.       Dementia, vascular    Stable. Discussed discontinuation of Aricept. Son to discuss with other family members.        Follow-up: 3 months  [place]

## 2016-07-19 NOTE — Assessment & Plan Note (Signed)
BP elevated today. Will continue to monitor closely (I prefer not to increase his medication given his advanced age at this time).

## 2016-07-19 NOTE — Assessment & Plan Note (Signed)
Stable. Discussed discontinuation of Aricept. Son to discuss with other family members.

## 2016-07-19 NOTE — Patient Instructions (Signed)
Follow up in 3 months.  Continue meds.  Take care  Dr. Lacinda Axon

## 2016-07-23 ENCOUNTER — Ambulatory Visit (INDEPENDENT_AMBULATORY_CARE_PROVIDER_SITE_OTHER): Payer: Medicare Other

## 2016-07-23 DIAGNOSIS — Z111 Encounter for screening for respiratory tuberculosis: Secondary | ICD-10-CM | POA: Diagnosis not present

## 2016-07-23 NOTE — Addendum Note (Signed)
Addended by: Carmin Muskrat on: 07/23/2016 10:19 AM   Modules accepted: Orders

## 2016-07-23 NOTE — Progress Notes (Signed)
Patient comes in for TB skin test.  Placed on right forearm .  Patient instructed to come back in for read in 48 hours no later than 72 hours for reading.

## 2016-07-25 ENCOUNTER — Ambulatory Visit (INDEPENDENT_AMBULATORY_CARE_PROVIDER_SITE_OTHER): Payer: Medicare Other

## 2016-07-25 DIAGNOSIS — Z111 Encounter for screening for respiratory tuberculosis: Secondary | ICD-10-CM | POA: Diagnosis not present

## 2016-07-25 LAB — TB SKIN TEST
INDURATION: 0 mm
TB SKIN TEST: NEGATIVE

## 2016-07-25 NOTE — Progress Notes (Signed)
Patient presents for PPD reading.  See results for documentation.

## 2016-07-25 NOTE — Progress Notes (Signed)
Patient presents for PPD reading

## 2016-07-25 NOTE — Progress Notes (Signed)
Care was provided under my supervision. I agree with the management as indicated in the note.  Samariyah Cowles DO  

## 2016-07-26 ENCOUNTER — Other Ambulatory Visit: Payer: Self-pay | Admitting: Family Medicine

## 2016-07-26 MED ORDER — IRON (FERROUS GLUCONATE) 256 (28 FE) MG PO TABS: 1.0000 | ORAL_TABLET | Freq: Every day | ORAL | Status: DC

## 2016-07-31 DIAGNOSIS — H4010X0 Unspecified open-angle glaucoma, stage unspecified: Secondary | ICD-10-CM | POA: Diagnosis not present

## 2016-07-31 DIAGNOSIS — H6123 Impacted cerumen, bilateral: Secondary | ICD-10-CM | POA: Diagnosis not present

## 2016-07-31 DIAGNOSIS — M810 Age-related osteoporosis without current pathological fracture: Secondary | ICD-10-CM | POA: Diagnosis not present

## 2016-07-31 DIAGNOSIS — D509 Iron deficiency anemia, unspecified: Secondary | ICD-10-CM | POA: Diagnosis not present

## 2016-07-31 DIAGNOSIS — R339 Retention of urine, unspecified: Secondary | ICD-10-CM | POA: Diagnosis not present

## 2016-07-31 DIAGNOSIS — I10 Essential (primary) hypertension: Secondary | ICD-10-CM | POA: Diagnosis not present

## 2016-07-31 DIAGNOSIS — F0150 Vascular dementia without behavioral disturbance: Secondary | ICD-10-CM | POA: Diagnosis not present

## 2016-07-31 DIAGNOSIS — E785 Hyperlipidemia, unspecified: Secondary | ICD-10-CM | POA: Diagnosis not present

## 2016-07-31 DIAGNOSIS — K219 Gastro-esophageal reflux disease without esophagitis: Secondary | ICD-10-CM | POA: Diagnosis not present

## 2016-08-03 DIAGNOSIS — L570 Actinic keratosis: Secondary | ICD-10-CM | POA: Diagnosis not present

## 2016-08-03 DIAGNOSIS — L110 Acquired keratosis follicularis: Secondary | ICD-10-CM | POA: Diagnosis not present

## 2016-08-03 DIAGNOSIS — X32XXXD Exposure to sunlight, subsequent encounter: Secondary | ICD-10-CM | POA: Diagnosis not present

## 2016-08-15 DIAGNOSIS — H43813 Vitreous degeneration, bilateral: Secondary | ICD-10-CM | POA: Diagnosis not present

## 2016-08-15 DIAGNOSIS — H401132 Primary open-angle glaucoma, bilateral, moderate stage: Secondary | ICD-10-CM | POA: Diagnosis not present

## 2016-08-15 DIAGNOSIS — H353131 Nonexudative age-related macular degeneration, bilateral, early dry stage: Secondary | ICD-10-CM | POA: Diagnosis not present

## 2016-08-15 DIAGNOSIS — H35033 Hypertensive retinopathy, bilateral: Secondary | ICD-10-CM | POA: Diagnosis not present

## 2016-08-15 DIAGNOSIS — H04123 Dry eye syndrome of bilateral lacrimal glands: Secondary | ICD-10-CM | POA: Diagnosis not present

## 2016-08-15 DIAGNOSIS — H18413 Arcus senilis, bilateral: Secondary | ICD-10-CM | POA: Diagnosis not present

## 2016-08-21 DIAGNOSIS — F0150 Vascular dementia without behavioral disturbance: Secondary | ICD-10-CM | POA: Diagnosis not present

## 2016-08-28 DIAGNOSIS — N390 Urinary tract infection, site not specified: Secondary | ICD-10-CM | POA: Diagnosis not present

## 2016-08-28 DIAGNOSIS — Z0000 Encounter for general adult medical examination without abnormal findings: Secondary | ICD-10-CM | POA: Diagnosis not present

## 2016-08-31 DIAGNOSIS — R4182 Altered mental status, unspecified: Secondary | ICD-10-CM | POA: Diagnosis not present

## 2016-09-04 DIAGNOSIS — F4321 Adjustment disorder with depressed mood: Secondary | ICD-10-CM | POA: Diagnosis not present

## 2016-09-18 DIAGNOSIS — E785 Hyperlipidemia, unspecified: Secondary | ICD-10-CM | POA: Diagnosis not present

## 2016-09-18 DIAGNOSIS — F0150 Vascular dementia without behavioral disturbance: Secondary | ICD-10-CM | POA: Diagnosis not present

## 2016-09-18 DIAGNOSIS — D509 Iron deficiency anemia, unspecified: Secondary | ICD-10-CM | POA: Diagnosis not present

## 2016-09-18 DIAGNOSIS — I10 Essential (primary) hypertension: Secondary | ICD-10-CM | POA: Diagnosis not present

## 2016-09-18 DIAGNOSIS — K219 Gastro-esophageal reflux disease without esophagitis: Secondary | ICD-10-CM | POA: Diagnosis not present

## 2016-09-18 DIAGNOSIS — H4010X0 Unspecified open-angle glaucoma, stage unspecified: Secondary | ICD-10-CM | POA: Diagnosis not present

## 2016-09-18 DIAGNOSIS — R339 Retention of urine, unspecified: Secondary | ICD-10-CM | POA: Diagnosis not present

## 2016-09-18 DIAGNOSIS — M810 Age-related osteoporosis without current pathological fracture: Secondary | ICD-10-CM | POA: Diagnosis not present

## 2016-09-25 DIAGNOSIS — F0150 Vascular dementia without behavioral disturbance: Secondary | ICD-10-CM | POA: Diagnosis not present

## 2016-09-25 DIAGNOSIS — F4321 Adjustment disorder with depressed mood: Secondary | ICD-10-CM | POA: Diagnosis not present

## 2016-09-25 DIAGNOSIS — R339 Retention of urine, unspecified: Secondary | ICD-10-CM | POA: Diagnosis not present

## 2016-09-25 DIAGNOSIS — H4010X0 Unspecified open-angle glaucoma, stage unspecified: Secondary | ICD-10-CM | POA: Diagnosis not present

## 2016-09-25 DIAGNOSIS — D509 Iron deficiency anemia, unspecified: Secondary | ICD-10-CM | POA: Diagnosis not present

## 2016-09-25 DIAGNOSIS — K219 Gastro-esophageal reflux disease without esophagitis: Secondary | ICD-10-CM | POA: Diagnosis not present

## 2016-09-25 DIAGNOSIS — I10 Essential (primary) hypertension: Secondary | ICD-10-CM | POA: Diagnosis not present

## 2016-09-25 DIAGNOSIS — E785 Hyperlipidemia, unspecified: Secondary | ICD-10-CM | POA: Diagnosis not present

## 2016-09-25 DIAGNOSIS — H6123 Impacted cerumen, bilateral: Secondary | ICD-10-CM | POA: Diagnosis not present

## 2016-09-25 DIAGNOSIS — M810 Age-related osteoporosis without current pathological fracture: Secondary | ICD-10-CM | POA: Diagnosis not present

## 2016-09-26 DIAGNOSIS — L603 Nail dystrophy: Secondary | ICD-10-CM | POA: Diagnosis not present

## 2016-09-26 DIAGNOSIS — R600 Localized edema: Secondary | ICD-10-CM | POA: Diagnosis not present

## 2016-09-26 DIAGNOSIS — L853 Xerosis cutis: Secondary | ICD-10-CM | POA: Diagnosis not present

## 2016-09-26 DIAGNOSIS — Q845 Enlarged and hypertrophic nails: Secondary | ICD-10-CM | POA: Diagnosis not present

## 2016-09-26 DIAGNOSIS — I739 Peripheral vascular disease, unspecified: Secondary | ICD-10-CM | POA: Diagnosis not present

## 2016-09-26 DIAGNOSIS — B351 Tinea unguium: Secondary | ICD-10-CM | POA: Diagnosis not present

## 2016-09-26 DIAGNOSIS — M2010 Hallux valgus (acquired), unspecified foot: Secondary | ICD-10-CM | POA: Diagnosis not present

## 2016-10-02 DIAGNOSIS — F0150 Vascular dementia without behavioral disturbance: Secondary | ICD-10-CM | POA: Diagnosis not present

## 2016-10-02 DIAGNOSIS — G4700 Insomnia, unspecified: Secondary | ICD-10-CM | POA: Diagnosis not present

## 2016-10-02 DIAGNOSIS — I10 Essential (primary) hypertension: Secondary | ICD-10-CM | POA: Diagnosis not present

## 2016-10-02 DIAGNOSIS — K219 Gastro-esophageal reflux disease without esophagitis: Secondary | ICD-10-CM | POA: Diagnosis not present

## 2016-10-08 DIAGNOSIS — H401133 Primary open-angle glaucoma, bilateral, severe stage: Secondary | ICD-10-CM | POA: Diagnosis not present

## 2016-10-09 DIAGNOSIS — F4321 Adjustment disorder with depressed mood: Secondary | ICD-10-CM | POA: Diagnosis not present

## 2016-10-09 DIAGNOSIS — F0150 Vascular dementia without behavioral disturbance: Secondary | ICD-10-CM | POA: Diagnosis not present

## 2016-10-12 DIAGNOSIS — M810 Age-related osteoporosis without current pathological fracture: Secondary | ICD-10-CM | POA: Diagnosis not present

## 2016-10-12 DIAGNOSIS — I10 Essential (primary) hypertension: Secondary | ICD-10-CM | POA: Diagnosis not present

## 2016-10-12 DIAGNOSIS — E785 Hyperlipidemia, unspecified: Secondary | ICD-10-CM | POA: Diagnosis not present

## 2016-10-12 DIAGNOSIS — K219 Gastro-esophageal reflux disease without esophagitis: Secondary | ICD-10-CM | POA: Diagnosis not present

## 2016-10-12 DIAGNOSIS — D509 Iron deficiency anemia, unspecified: Secondary | ICD-10-CM | POA: Diagnosis not present

## 2016-10-15 DIAGNOSIS — K219 Gastro-esophageal reflux disease without esophagitis: Secondary | ICD-10-CM | POA: Diagnosis not present

## 2016-10-15 DIAGNOSIS — M810 Age-related osteoporosis without current pathological fracture: Secondary | ICD-10-CM | POA: Diagnosis not present

## 2016-10-15 DIAGNOSIS — F0150 Vascular dementia without behavioral disturbance: Secondary | ICD-10-CM | POA: Diagnosis not present

## 2016-10-15 DIAGNOSIS — D509 Iron deficiency anemia, unspecified: Secondary | ICD-10-CM | POA: Diagnosis not present

## 2016-10-15 DIAGNOSIS — I10 Essential (primary) hypertension: Secondary | ICD-10-CM | POA: Diagnosis not present

## 2016-10-15 DIAGNOSIS — E785 Hyperlipidemia, unspecified: Secondary | ICD-10-CM | POA: Diagnosis not present

## 2016-10-15 DIAGNOSIS — R339 Retention of urine, unspecified: Secondary | ICD-10-CM | POA: Diagnosis not present

## 2016-10-15 DIAGNOSIS — H4010X0 Unspecified open-angle glaucoma, stage unspecified: Secondary | ICD-10-CM | POA: Diagnosis not present

## 2016-10-16 DIAGNOSIS — G4700 Insomnia, unspecified: Secondary | ICD-10-CM | POA: Diagnosis not present

## 2016-10-16 DIAGNOSIS — F4321 Adjustment disorder with depressed mood: Secondary | ICD-10-CM | POA: Diagnosis not present

## 2016-10-16 DIAGNOSIS — F0150 Vascular dementia without behavioral disturbance: Secondary | ICD-10-CM | POA: Diagnosis not present

## 2016-10-22 ENCOUNTER — Telehealth: Payer: Self-pay | Admitting: Family Medicine

## 2016-10-22 NOTE — Telephone Encounter (Signed)
Called to to schedule AWV. Phone # changed

## 2016-10-23 DIAGNOSIS — F0150 Vascular dementia without behavioral disturbance: Secondary | ICD-10-CM | POA: Diagnosis not present

## 2016-10-23 DIAGNOSIS — F4321 Adjustment disorder with depressed mood: Secondary | ICD-10-CM | POA: Diagnosis not present

## 2016-10-30 DIAGNOSIS — K219 Gastro-esophageal reflux disease without esophagitis: Secondary | ICD-10-CM | POA: Diagnosis not present

## 2016-10-30 DIAGNOSIS — I10 Essential (primary) hypertension: Secondary | ICD-10-CM | POA: Diagnosis not present

## 2016-10-30 DIAGNOSIS — R339 Retention of urine, unspecified: Secondary | ICD-10-CM | POA: Diagnosis not present

## 2016-10-30 DIAGNOSIS — H6123 Impacted cerumen, bilateral: Secondary | ICD-10-CM | POA: Diagnosis not present

## 2016-10-30 DIAGNOSIS — M810 Age-related osteoporosis without current pathological fracture: Secondary | ICD-10-CM | POA: Diagnosis not present

## 2016-10-30 DIAGNOSIS — E785 Hyperlipidemia, unspecified: Secondary | ICD-10-CM | POA: Diagnosis not present

## 2016-10-30 DIAGNOSIS — F0150 Vascular dementia without behavioral disturbance: Secondary | ICD-10-CM | POA: Diagnosis not present

## 2016-10-30 DIAGNOSIS — D509 Iron deficiency anemia, unspecified: Secondary | ICD-10-CM | POA: Diagnosis not present

## 2016-10-30 DIAGNOSIS — F4321 Adjustment disorder with depressed mood: Secondary | ICD-10-CM | POA: Diagnosis not present

## 2016-10-30 DIAGNOSIS — H4010X0 Unspecified open-angle glaucoma, stage unspecified: Secondary | ICD-10-CM | POA: Diagnosis not present

## 2016-11-06 DIAGNOSIS — F4321 Adjustment disorder with depressed mood: Secondary | ICD-10-CM | POA: Diagnosis not present

## 2016-11-06 DIAGNOSIS — F0150 Vascular dementia without behavioral disturbance: Secondary | ICD-10-CM | POA: Diagnosis not present

## 2016-11-13 DIAGNOSIS — H4010X0 Unspecified open-angle glaucoma, stage unspecified: Secondary | ICD-10-CM | POA: Diagnosis not present

## 2016-11-13 DIAGNOSIS — E785 Hyperlipidemia, unspecified: Secondary | ICD-10-CM | POA: Diagnosis not present

## 2016-11-13 DIAGNOSIS — K219 Gastro-esophageal reflux disease without esophagitis: Secondary | ICD-10-CM | POA: Diagnosis not present

## 2016-11-13 DIAGNOSIS — I10 Essential (primary) hypertension: Secondary | ICD-10-CM | POA: Diagnosis not present

## 2016-11-13 DIAGNOSIS — F0150 Vascular dementia without behavioral disturbance: Secondary | ICD-10-CM | POA: Diagnosis not present

## 2016-11-13 DIAGNOSIS — G4700 Insomnia, unspecified: Secondary | ICD-10-CM | POA: Diagnosis not present

## 2016-11-13 DIAGNOSIS — D509 Iron deficiency anemia, unspecified: Secondary | ICD-10-CM | POA: Diagnosis not present

## 2016-11-13 DIAGNOSIS — M810 Age-related osteoporosis without current pathological fracture: Secondary | ICD-10-CM | POA: Diagnosis not present

## 2016-11-14 DIAGNOSIS — F0150 Vascular dementia without behavioral disturbance: Secondary | ICD-10-CM | POA: Diagnosis not present

## 2016-11-14 DIAGNOSIS — G4700 Insomnia, unspecified: Secondary | ICD-10-CM | POA: Diagnosis not present

## 2016-11-27 DIAGNOSIS — I10 Essential (primary) hypertension: Secondary | ICD-10-CM | POA: Diagnosis not present

## 2016-11-27 DIAGNOSIS — D509 Iron deficiency anemia, unspecified: Secondary | ICD-10-CM | POA: Diagnosis not present

## 2016-11-27 DIAGNOSIS — H4010X0 Unspecified open-angle glaucoma, stage unspecified: Secondary | ICD-10-CM | POA: Diagnosis not present

## 2016-11-27 DIAGNOSIS — R339 Retention of urine, unspecified: Secondary | ICD-10-CM | POA: Diagnosis not present

## 2016-11-27 DIAGNOSIS — K219 Gastro-esophageal reflux disease without esophagitis: Secondary | ICD-10-CM | POA: Diagnosis not present

## 2016-11-27 DIAGNOSIS — H6123 Impacted cerumen, bilateral: Secondary | ICD-10-CM | POA: Diagnosis not present

## 2016-11-27 DIAGNOSIS — M810 Age-related osteoporosis without current pathological fracture: Secondary | ICD-10-CM | POA: Diagnosis not present

## 2016-11-27 DIAGNOSIS — E785 Hyperlipidemia, unspecified: Secondary | ICD-10-CM | POA: Diagnosis not present

## 2016-11-27 DIAGNOSIS — F4321 Adjustment disorder with depressed mood: Secondary | ICD-10-CM | POA: Diagnosis not present

## 2016-11-27 DIAGNOSIS — F0150 Vascular dementia without behavioral disturbance: Secondary | ICD-10-CM | POA: Diagnosis not present

## 2016-12-04 DIAGNOSIS — K219 Gastro-esophageal reflux disease without esophagitis: Secondary | ICD-10-CM | POA: Diagnosis not present

## 2016-12-12 DIAGNOSIS — M810 Age-related osteoporosis without current pathological fracture: Secondary | ICD-10-CM | POA: Diagnosis not present

## 2016-12-12 DIAGNOSIS — I10 Essential (primary) hypertension: Secondary | ICD-10-CM | POA: Diagnosis not present

## 2016-12-12 DIAGNOSIS — F0150 Vascular dementia without behavioral disturbance: Secondary | ICD-10-CM | POA: Diagnosis not present

## 2016-12-12 DIAGNOSIS — G4700 Insomnia, unspecified: Secondary | ICD-10-CM | POA: Diagnosis not present

## 2016-12-12 DIAGNOSIS — K219 Gastro-esophageal reflux disease without esophagitis: Secondary | ICD-10-CM | POA: Diagnosis not present

## 2016-12-12 DIAGNOSIS — E785 Hyperlipidemia, unspecified: Secondary | ICD-10-CM | POA: Diagnosis not present

## 2016-12-12 DIAGNOSIS — H4010X0 Unspecified open-angle glaucoma, stage unspecified: Secondary | ICD-10-CM | POA: Diagnosis not present

## 2016-12-12 DIAGNOSIS — D509 Iron deficiency anemia, unspecified: Secondary | ICD-10-CM | POA: Diagnosis not present

## 2016-12-13 DIAGNOSIS — Z961 Presence of intraocular lens: Secondary | ICD-10-CM | POA: Diagnosis not present

## 2016-12-13 DIAGNOSIS — H401133 Primary open-angle glaucoma, bilateral, severe stage: Secondary | ICD-10-CM | POA: Diagnosis not present

## 2016-12-25 DIAGNOSIS — E785 Hyperlipidemia, unspecified: Secondary | ICD-10-CM | POA: Diagnosis not present

## 2016-12-25 DIAGNOSIS — R339 Retention of urine, unspecified: Secondary | ICD-10-CM | POA: Diagnosis not present

## 2016-12-25 DIAGNOSIS — M810 Age-related osteoporosis without current pathological fracture: Secondary | ICD-10-CM | POA: Diagnosis not present

## 2016-12-25 DIAGNOSIS — I10 Essential (primary) hypertension: Secondary | ICD-10-CM | POA: Diagnosis not present

## 2016-12-25 DIAGNOSIS — D509 Iron deficiency anemia, unspecified: Secondary | ICD-10-CM | POA: Diagnosis not present

## 2016-12-25 DIAGNOSIS — H6123 Impacted cerumen, bilateral: Secondary | ICD-10-CM | POA: Diagnosis not present

## 2016-12-25 DIAGNOSIS — K219 Gastro-esophageal reflux disease without esophagitis: Secondary | ICD-10-CM | POA: Diagnosis not present

## 2016-12-25 DIAGNOSIS — H4010X0 Unspecified open-angle glaucoma, stage unspecified: Secondary | ICD-10-CM | POA: Diagnosis not present

## 2016-12-26 DIAGNOSIS — F0150 Vascular dementia without behavioral disturbance: Secondary | ICD-10-CM | POA: Diagnosis not present

## 2016-12-26 DIAGNOSIS — G4700 Insomnia, unspecified: Secondary | ICD-10-CM | POA: Diagnosis not present

## 2017-01-01 DIAGNOSIS — K219 Gastro-esophageal reflux disease without esophagitis: Secondary | ICD-10-CM | POA: Diagnosis not present

## 2017-01-02 DIAGNOSIS — B351 Tinea unguium: Secondary | ICD-10-CM | POA: Diagnosis not present

## 2017-01-02 DIAGNOSIS — Q845 Enlarged and hypertrophic nails: Secondary | ICD-10-CM | POA: Diagnosis not present

## 2017-01-02 DIAGNOSIS — M2010 Hallux valgus (acquired), unspecified foot: Secondary | ICD-10-CM | POA: Diagnosis not present

## 2017-01-02 DIAGNOSIS — R600 Localized edema: Secondary | ICD-10-CM | POA: Diagnosis not present

## 2017-01-02 DIAGNOSIS — L603 Nail dystrophy: Secondary | ICD-10-CM | POA: Diagnosis not present

## 2017-01-02 DIAGNOSIS — L853 Xerosis cutis: Secondary | ICD-10-CM | POA: Diagnosis not present

## 2017-01-02 DIAGNOSIS — I739 Peripheral vascular disease, unspecified: Secondary | ICD-10-CM | POA: Diagnosis not present

## 2017-01-11 DIAGNOSIS — E039 Hypothyroidism, unspecified: Secondary | ICD-10-CM | POA: Diagnosis not present

## 2017-01-11 DIAGNOSIS — I10 Essential (primary) hypertension: Secondary | ICD-10-CM | POA: Diagnosis not present

## 2017-01-15 DIAGNOSIS — M810 Age-related osteoporosis without current pathological fracture: Secondary | ICD-10-CM | POA: Diagnosis not present

## 2017-01-15 DIAGNOSIS — H4010X0 Unspecified open-angle glaucoma, stage unspecified: Secondary | ICD-10-CM | POA: Diagnosis not present

## 2017-01-15 DIAGNOSIS — F0150 Vascular dementia without behavioral disturbance: Secondary | ICD-10-CM | POA: Diagnosis not present

## 2017-01-15 DIAGNOSIS — D509 Iron deficiency anemia, unspecified: Secondary | ICD-10-CM | POA: Diagnosis not present

## 2017-01-15 DIAGNOSIS — R339 Retention of urine, unspecified: Secondary | ICD-10-CM | POA: Diagnosis not present

## 2017-01-15 DIAGNOSIS — K219 Gastro-esophageal reflux disease without esophagitis: Secondary | ICD-10-CM | POA: Diagnosis not present

## 2017-01-15 DIAGNOSIS — E785 Hyperlipidemia, unspecified: Secondary | ICD-10-CM | POA: Diagnosis not present

## 2017-01-15 DIAGNOSIS — I10 Essential (primary) hypertension: Secondary | ICD-10-CM | POA: Diagnosis not present

## 2017-01-15 DIAGNOSIS — G4700 Insomnia, unspecified: Secondary | ICD-10-CM | POA: Diagnosis not present

## 2017-01-22 DIAGNOSIS — E785 Hyperlipidemia, unspecified: Secondary | ICD-10-CM | POA: Diagnosis not present

## 2017-01-22 DIAGNOSIS — I10 Essential (primary) hypertension: Secondary | ICD-10-CM | POA: Diagnosis not present

## 2017-01-22 DIAGNOSIS — K219 Gastro-esophageal reflux disease without esophagitis: Secondary | ICD-10-CM | POA: Diagnosis not present

## 2017-01-22 DIAGNOSIS — R339 Retention of urine, unspecified: Secondary | ICD-10-CM | POA: Diagnosis not present

## 2017-01-22 DIAGNOSIS — H6123 Impacted cerumen, bilateral: Secondary | ICD-10-CM | POA: Diagnosis not present

## 2017-01-22 DIAGNOSIS — G4700 Insomnia, unspecified: Secondary | ICD-10-CM | POA: Diagnosis not present

## 2017-01-22 DIAGNOSIS — H4010X0 Unspecified open-angle glaucoma, stage unspecified: Secondary | ICD-10-CM | POA: Diagnosis not present

## 2017-01-22 DIAGNOSIS — F0150 Vascular dementia without behavioral disturbance: Secondary | ICD-10-CM | POA: Diagnosis not present

## 2017-01-22 DIAGNOSIS — D509 Iron deficiency anemia, unspecified: Secondary | ICD-10-CM | POA: Diagnosis not present

## 2017-01-22 DIAGNOSIS — M810 Age-related osteoporosis without current pathological fracture: Secondary | ICD-10-CM | POA: Diagnosis not present

## 2017-01-29 DIAGNOSIS — F0150 Vascular dementia without behavioral disturbance: Secondary | ICD-10-CM | POA: Diagnosis not present

## 2017-01-29 DIAGNOSIS — G4700 Insomnia, unspecified: Secondary | ICD-10-CM | POA: Diagnosis not present

## 2017-02-05 DIAGNOSIS — I10 Essential (primary) hypertension: Secondary | ICD-10-CM | POA: Diagnosis not present

## 2017-02-05 DIAGNOSIS — R339 Retention of urine, unspecified: Secondary | ICD-10-CM | POA: Diagnosis not present

## 2017-02-05 DIAGNOSIS — G4700 Insomnia, unspecified: Secondary | ICD-10-CM | POA: Diagnosis not present

## 2017-02-05 DIAGNOSIS — H4010X0 Unspecified open-angle glaucoma, stage unspecified: Secondary | ICD-10-CM | POA: Diagnosis not present

## 2017-02-05 DIAGNOSIS — D509 Iron deficiency anemia, unspecified: Secondary | ICD-10-CM | POA: Diagnosis not present

## 2017-02-05 DIAGNOSIS — E785 Hyperlipidemia, unspecified: Secondary | ICD-10-CM | POA: Diagnosis not present

## 2017-02-05 DIAGNOSIS — M810 Age-related osteoporosis without current pathological fracture: Secondary | ICD-10-CM | POA: Diagnosis not present

## 2017-02-05 DIAGNOSIS — H6123 Impacted cerumen, bilateral: Secondary | ICD-10-CM | POA: Diagnosis not present

## 2017-02-05 DIAGNOSIS — F0150 Vascular dementia without behavioral disturbance: Secondary | ICD-10-CM | POA: Diagnosis not present

## 2017-02-05 DIAGNOSIS — K219 Gastro-esophageal reflux disease without esophagitis: Secondary | ICD-10-CM | POA: Diagnosis not present

## 2017-02-12 DIAGNOSIS — N390 Urinary tract infection, site not specified: Secondary | ICD-10-CM | POA: Diagnosis not present

## 2017-02-13 DIAGNOSIS — H4010X0 Unspecified open-angle glaucoma, stage unspecified: Secondary | ICD-10-CM | POA: Diagnosis not present

## 2017-02-13 DIAGNOSIS — E785 Hyperlipidemia, unspecified: Secondary | ICD-10-CM | POA: Diagnosis not present

## 2017-02-13 DIAGNOSIS — R339 Retention of urine, unspecified: Secondary | ICD-10-CM | POA: Diagnosis not present

## 2017-02-13 DIAGNOSIS — I10 Essential (primary) hypertension: Secondary | ICD-10-CM | POA: Diagnosis not present

## 2017-02-13 DIAGNOSIS — G4700 Insomnia, unspecified: Secondary | ICD-10-CM | POA: Diagnosis not present

## 2017-02-13 DIAGNOSIS — F0150 Vascular dementia without behavioral disturbance: Secondary | ICD-10-CM | POA: Diagnosis not present

## 2017-02-13 DIAGNOSIS — M810 Age-related osteoporosis without current pathological fracture: Secondary | ICD-10-CM | POA: Diagnosis not present

## 2017-02-13 DIAGNOSIS — D509 Iron deficiency anemia, unspecified: Secondary | ICD-10-CM | POA: Diagnosis not present

## 2017-02-13 DIAGNOSIS — K219 Gastro-esophageal reflux disease without esophagitis: Secondary | ICD-10-CM | POA: Diagnosis not present

## 2017-02-14 DIAGNOSIS — H35372 Puckering of macula, left eye: Secondary | ICD-10-CM | POA: Diagnosis not present

## 2017-02-14 DIAGNOSIS — H34811 Central retinal vein occlusion, right eye, with macular edema: Secondary | ICD-10-CM | POA: Diagnosis not present

## 2017-02-18 DIAGNOSIS — H401133 Primary open-angle glaucoma, bilateral, severe stage: Secondary | ICD-10-CM | POA: Diagnosis not present

## 2017-02-18 DIAGNOSIS — Z79899 Other long term (current) drug therapy: Secondary | ICD-10-CM | POA: Diagnosis not present

## 2017-02-19 DIAGNOSIS — L821 Other seborrheic keratosis: Secondary | ICD-10-CM | POA: Diagnosis not present

## 2017-02-19 DIAGNOSIS — G4700 Insomnia, unspecified: Secondary | ICD-10-CM | POA: Diagnosis not present

## 2017-02-19 DIAGNOSIS — I10 Essential (primary) hypertension: Secondary | ICD-10-CM | POA: Diagnosis not present

## 2017-02-19 DIAGNOSIS — D509 Iron deficiency anemia, unspecified: Secondary | ICD-10-CM | POA: Diagnosis not present

## 2017-02-19 DIAGNOSIS — H4010X0 Unspecified open-angle glaucoma, stage unspecified: Secondary | ICD-10-CM | POA: Diagnosis not present

## 2017-02-19 DIAGNOSIS — X32XXXD Exposure to sunlight, subsequent encounter: Secondary | ICD-10-CM | POA: Diagnosis not present

## 2017-02-19 DIAGNOSIS — R339 Retention of urine, unspecified: Secondary | ICD-10-CM | POA: Diagnosis not present

## 2017-02-19 DIAGNOSIS — F0150 Vascular dementia without behavioral disturbance: Secondary | ICD-10-CM | POA: Diagnosis not present

## 2017-02-19 DIAGNOSIS — H6123 Impacted cerumen, bilateral: Secondary | ICD-10-CM | POA: Diagnosis not present

## 2017-02-19 DIAGNOSIS — E785 Hyperlipidemia, unspecified: Secondary | ICD-10-CM | POA: Diagnosis not present

## 2017-02-19 DIAGNOSIS — M810 Age-related osteoporosis without current pathological fracture: Secondary | ICD-10-CM | POA: Diagnosis not present

## 2017-02-19 DIAGNOSIS — R4182 Altered mental status, unspecified: Secondary | ICD-10-CM | POA: Diagnosis not present

## 2017-02-19 DIAGNOSIS — B078 Other viral warts: Secondary | ICD-10-CM | POA: Diagnosis not present

## 2017-02-19 DIAGNOSIS — K219 Gastro-esophageal reflux disease without esophagitis: Secondary | ICD-10-CM | POA: Diagnosis not present

## 2017-02-19 DIAGNOSIS — L570 Actinic keratosis: Secondary | ICD-10-CM | POA: Diagnosis not present

## 2017-02-26 DIAGNOSIS — G4700 Insomnia, unspecified: Secondary | ICD-10-CM | POA: Diagnosis not present

## 2017-02-26 DIAGNOSIS — F0150 Vascular dementia without behavioral disturbance: Secondary | ICD-10-CM | POA: Diagnosis not present

## 2017-03-05 DIAGNOSIS — F0150 Vascular dementia without behavioral disturbance: Secondary | ICD-10-CM | POA: Diagnosis not present

## 2017-03-05 DIAGNOSIS — G4700 Insomnia, unspecified: Secondary | ICD-10-CM | POA: Diagnosis not present

## 2017-03-12 DIAGNOSIS — F0150 Vascular dementia without behavioral disturbance: Secondary | ICD-10-CM | POA: Diagnosis not present

## 2017-03-12 DIAGNOSIS — G4700 Insomnia, unspecified: Secondary | ICD-10-CM | POA: Diagnosis not present

## 2017-03-18 DIAGNOSIS — R339 Retention of urine, unspecified: Secondary | ICD-10-CM | POA: Diagnosis not present

## 2017-03-18 DIAGNOSIS — M810 Age-related osteoporosis without current pathological fracture: Secondary | ICD-10-CM | POA: Diagnosis not present

## 2017-03-18 DIAGNOSIS — K219 Gastro-esophageal reflux disease without esophagitis: Secondary | ICD-10-CM | POA: Diagnosis not present

## 2017-03-18 DIAGNOSIS — E785 Hyperlipidemia, unspecified: Secondary | ICD-10-CM | POA: Diagnosis not present

## 2017-03-18 DIAGNOSIS — I10 Essential (primary) hypertension: Secondary | ICD-10-CM | POA: Diagnosis not present

## 2017-03-18 DIAGNOSIS — D509 Iron deficiency anemia, unspecified: Secondary | ICD-10-CM | POA: Diagnosis not present

## 2017-03-18 DIAGNOSIS — H4010X0 Unspecified open-angle glaucoma, stage unspecified: Secondary | ICD-10-CM | POA: Diagnosis not present

## 2017-03-18 DIAGNOSIS — G4700 Insomnia, unspecified: Secondary | ICD-10-CM | POA: Diagnosis not present

## 2017-03-18 DIAGNOSIS — F0150 Vascular dementia without behavioral disturbance: Secondary | ICD-10-CM | POA: Diagnosis not present

## 2017-03-19 DIAGNOSIS — D509 Iron deficiency anemia, unspecified: Secondary | ICD-10-CM | POA: Diagnosis not present

## 2017-03-19 DIAGNOSIS — R339 Retention of urine, unspecified: Secondary | ICD-10-CM | POA: Diagnosis not present

## 2017-03-19 DIAGNOSIS — R4182 Altered mental status, unspecified: Secondary | ICD-10-CM | POA: Diagnosis not present

## 2017-03-19 DIAGNOSIS — H6123 Impacted cerumen, bilateral: Secondary | ICD-10-CM | POA: Diagnosis not present

## 2017-03-19 DIAGNOSIS — M810 Age-related osteoporosis without current pathological fracture: Secondary | ICD-10-CM | POA: Diagnosis not present

## 2017-03-19 DIAGNOSIS — F0150 Vascular dementia without behavioral disturbance: Secondary | ICD-10-CM | POA: Diagnosis not present

## 2017-03-19 DIAGNOSIS — I10 Essential (primary) hypertension: Secondary | ICD-10-CM | POA: Diagnosis not present

## 2017-03-19 DIAGNOSIS — E785 Hyperlipidemia, unspecified: Secondary | ICD-10-CM | POA: Diagnosis not present

## 2017-03-19 DIAGNOSIS — H4010X0 Unspecified open-angle glaucoma, stage unspecified: Secondary | ICD-10-CM | POA: Diagnosis not present

## 2017-03-19 DIAGNOSIS — K219 Gastro-esophageal reflux disease without esophagitis: Secondary | ICD-10-CM | POA: Diagnosis not present

## 2017-03-20 DIAGNOSIS — G4700 Insomnia, unspecified: Secondary | ICD-10-CM | POA: Diagnosis not present

## 2017-03-20 DIAGNOSIS — H34811 Central retinal vein occlusion, right eye, with macular edema: Secondary | ICD-10-CM | POA: Diagnosis not present

## 2017-03-20 DIAGNOSIS — F0150 Vascular dementia without behavioral disturbance: Secondary | ICD-10-CM | POA: Diagnosis not present

## 2017-04-01 ENCOUNTER — Telehealth: Payer: Self-pay | Admitting: Family Medicine

## 2017-04-01 NOTE — Telephone Encounter (Signed)
Home # has been disconnected. LVM for pt son Alexander Goodwin to call back and update phone info. Pt due for AWV

## 2017-04-10 DIAGNOSIS — L853 Xerosis cutis: Secondary | ICD-10-CM | POA: Diagnosis not present

## 2017-04-10 DIAGNOSIS — Q845 Enlarged and hypertrophic nails: Secondary | ICD-10-CM | POA: Diagnosis not present

## 2017-04-10 DIAGNOSIS — I739 Peripheral vascular disease, unspecified: Secondary | ICD-10-CM | POA: Diagnosis not present

## 2017-04-10 DIAGNOSIS — L603 Nail dystrophy: Secondary | ICD-10-CM | POA: Diagnosis not present

## 2017-04-10 DIAGNOSIS — R600 Localized edema: Secondary | ICD-10-CM | POA: Diagnosis not present

## 2017-04-10 DIAGNOSIS — B351 Tinea unguium: Secondary | ICD-10-CM | POA: Diagnosis not present

## 2017-04-10 DIAGNOSIS — M2010 Hallux valgus (acquired), unspecified foot: Secondary | ICD-10-CM | POA: Diagnosis not present

## 2017-04-15 DIAGNOSIS — G4700 Insomnia, unspecified: Secondary | ICD-10-CM | POA: Diagnosis not present

## 2017-04-15 DIAGNOSIS — F0150 Vascular dementia without behavioral disturbance: Secondary | ICD-10-CM | POA: Diagnosis not present

## 2017-04-16 DIAGNOSIS — D509 Iron deficiency anemia, unspecified: Secondary | ICD-10-CM | POA: Diagnosis not present

## 2017-04-16 DIAGNOSIS — R4182 Altered mental status, unspecified: Secondary | ICD-10-CM | POA: Diagnosis not present

## 2017-04-16 DIAGNOSIS — H6123 Impacted cerumen, bilateral: Secondary | ICD-10-CM | POA: Diagnosis not present

## 2017-04-16 DIAGNOSIS — E785 Hyperlipidemia, unspecified: Secondary | ICD-10-CM | POA: Diagnosis not present

## 2017-04-16 DIAGNOSIS — K219 Gastro-esophageal reflux disease without esophagitis: Secondary | ICD-10-CM | POA: Diagnosis not present

## 2017-04-16 DIAGNOSIS — F0150 Vascular dementia without behavioral disturbance: Secondary | ICD-10-CM | POA: Diagnosis not present

## 2017-04-16 DIAGNOSIS — I10 Essential (primary) hypertension: Secondary | ICD-10-CM | POA: Diagnosis not present

## 2017-04-16 DIAGNOSIS — H4010X0 Unspecified open-angle glaucoma, stage unspecified: Secondary | ICD-10-CM | POA: Diagnosis not present

## 2017-04-16 DIAGNOSIS — R339 Retention of urine, unspecified: Secondary | ICD-10-CM | POA: Diagnosis not present

## 2017-04-16 DIAGNOSIS — M810 Age-related osteoporosis without current pathological fracture: Secondary | ICD-10-CM | POA: Diagnosis not present

## 2017-04-17 DIAGNOSIS — E785 Hyperlipidemia, unspecified: Secondary | ICD-10-CM | POA: Diagnosis not present

## 2017-04-17 DIAGNOSIS — K219 Gastro-esophageal reflux disease without esophagitis: Secondary | ICD-10-CM | POA: Diagnosis not present

## 2017-04-17 DIAGNOSIS — G4700 Insomnia, unspecified: Secondary | ICD-10-CM | POA: Diagnosis not present

## 2017-04-17 DIAGNOSIS — F0150 Vascular dementia without behavioral disturbance: Secondary | ICD-10-CM | POA: Diagnosis not present

## 2017-04-17 DIAGNOSIS — R339 Retention of urine, unspecified: Secondary | ICD-10-CM | POA: Diagnosis not present

## 2017-04-17 DIAGNOSIS — I10 Essential (primary) hypertension: Secondary | ICD-10-CM | POA: Diagnosis not present

## 2017-04-17 DIAGNOSIS — M810 Age-related osteoporosis without current pathological fracture: Secondary | ICD-10-CM | POA: Diagnosis not present

## 2017-04-17 DIAGNOSIS — H4010X0 Unspecified open-angle glaucoma, stage unspecified: Secondary | ICD-10-CM | POA: Diagnosis not present

## 2017-04-17 DIAGNOSIS — D509 Iron deficiency anemia, unspecified: Secondary | ICD-10-CM | POA: Diagnosis not present

## 2017-04-19 DIAGNOSIS — E039 Hypothyroidism, unspecified: Secondary | ICD-10-CM | POA: Diagnosis not present

## 2017-04-19 DIAGNOSIS — E782 Mixed hyperlipidemia: Secondary | ICD-10-CM | POA: Diagnosis not present

## 2017-04-19 DIAGNOSIS — I10 Essential (primary) hypertension: Secondary | ICD-10-CM | POA: Diagnosis not present

## 2017-04-30 DIAGNOSIS — F0150 Vascular dementia without behavioral disturbance: Secondary | ICD-10-CM | POA: Diagnosis not present

## 2017-04-30 DIAGNOSIS — G4700 Insomnia, unspecified: Secondary | ICD-10-CM | POA: Diagnosis not present

## 2017-05-01 DIAGNOSIS — H34811 Central retinal vein occlusion, right eye, with macular edema: Secondary | ICD-10-CM | POA: Diagnosis not present

## 2017-05-08 NOTE — Telephone Encounter (Signed)
Not appropriate for AWV °

## 2017-05-09 DIAGNOSIS — Z23 Encounter for immunization: Secondary | ICD-10-CM | POA: Diagnosis not present

## 2017-05-14 DIAGNOSIS — I10 Essential (primary) hypertension: Secondary | ICD-10-CM | POA: Diagnosis not present

## 2017-05-14 DIAGNOSIS — D509 Iron deficiency anemia, unspecified: Secondary | ICD-10-CM | POA: Diagnosis not present

## 2017-05-14 DIAGNOSIS — K219 Gastro-esophageal reflux disease without esophagitis: Secondary | ICD-10-CM | POA: Diagnosis not present

## 2017-05-14 DIAGNOSIS — E785 Hyperlipidemia, unspecified: Secondary | ICD-10-CM | POA: Diagnosis not present

## 2017-05-14 DIAGNOSIS — M810 Age-related osteoporosis without current pathological fracture: Secondary | ICD-10-CM | POA: Diagnosis not present

## 2017-05-14 DIAGNOSIS — R4182 Altered mental status, unspecified: Secondary | ICD-10-CM | POA: Diagnosis not present

## 2017-05-14 DIAGNOSIS — R339 Retention of urine, unspecified: Secondary | ICD-10-CM | POA: Diagnosis not present

## 2017-05-14 DIAGNOSIS — H6123 Impacted cerumen, bilateral: Secondary | ICD-10-CM | POA: Diagnosis not present

## 2017-05-14 DIAGNOSIS — F0150 Vascular dementia without behavioral disturbance: Secondary | ICD-10-CM | POA: Diagnosis not present

## 2017-05-14 DIAGNOSIS — H4010X0 Unspecified open-angle glaucoma, stage unspecified: Secondary | ICD-10-CM | POA: Diagnosis not present

## 2017-05-17 DIAGNOSIS — I10 Essential (primary) hypertension: Secondary | ICD-10-CM | POA: Diagnosis not present

## 2017-05-17 DIAGNOSIS — F0150 Vascular dementia without behavioral disturbance: Secondary | ICD-10-CM | POA: Diagnosis not present

## 2017-05-17 DIAGNOSIS — E785 Hyperlipidemia, unspecified: Secondary | ICD-10-CM | POA: Diagnosis not present

## 2017-05-17 DIAGNOSIS — H4010X0 Unspecified open-angle glaucoma, stage unspecified: Secondary | ICD-10-CM | POA: Diagnosis not present

## 2017-05-17 DIAGNOSIS — M810 Age-related osteoporosis without current pathological fracture: Secondary | ICD-10-CM | POA: Diagnosis not present

## 2017-05-17 DIAGNOSIS — G4700 Insomnia, unspecified: Secondary | ICD-10-CM | POA: Diagnosis not present

## 2017-05-17 DIAGNOSIS — R339 Retention of urine, unspecified: Secondary | ICD-10-CM | POA: Diagnosis not present

## 2017-05-17 DIAGNOSIS — D509 Iron deficiency anemia, unspecified: Secondary | ICD-10-CM | POA: Diagnosis not present

## 2017-05-17 DIAGNOSIS — K219 Gastro-esophageal reflux disease without esophagitis: Secondary | ICD-10-CM | POA: Diagnosis not present

## 2017-05-27 DIAGNOSIS — F0150 Vascular dementia without behavioral disturbance: Secondary | ICD-10-CM | POA: Diagnosis not present

## 2017-05-27 DIAGNOSIS — G4700 Insomnia, unspecified: Secondary | ICD-10-CM | POA: Diagnosis not present

## 2017-06-11 DIAGNOSIS — H6123 Impacted cerumen, bilateral: Secondary | ICD-10-CM | POA: Diagnosis not present

## 2017-06-11 DIAGNOSIS — D509 Iron deficiency anemia, unspecified: Secondary | ICD-10-CM | POA: Diagnosis not present

## 2017-06-11 DIAGNOSIS — N390 Urinary tract infection, site not specified: Secondary | ICD-10-CM | POA: Diagnosis not present

## 2017-06-11 DIAGNOSIS — R4182 Altered mental status, unspecified: Secondary | ICD-10-CM | POA: Diagnosis not present

## 2017-06-11 DIAGNOSIS — H4010X0 Unspecified open-angle glaucoma, stage unspecified: Secondary | ICD-10-CM | POA: Diagnosis not present

## 2017-06-11 DIAGNOSIS — I10 Essential (primary) hypertension: Secondary | ICD-10-CM | POA: Diagnosis not present

## 2017-06-11 DIAGNOSIS — M810 Age-related osteoporosis without current pathological fracture: Secondary | ICD-10-CM | POA: Diagnosis not present

## 2017-06-11 DIAGNOSIS — R339 Retention of urine, unspecified: Secondary | ICD-10-CM | POA: Diagnosis not present

## 2017-06-11 DIAGNOSIS — E785 Hyperlipidemia, unspecified: Secondary | ICD-10-CM | POA: Diagnosis not present

## 2017-06-11 DIAGNOSIS — F0150 Vascular dementia without behavioral disturbance: Secondary | ICD-10-CM | POA: Diagnosis not present

## 2017-06-11 DIAGNOSIS — K219 Gastro-esophageal reflux disease without esophagitis: Secondary | ICD-10-CM | POA: Diagnosis not present

## 2017-06-14 DIAGNOSIS — R339 Retention of urine, unspecified: Secondary | ICD-10-CM | POA: Diagnosis not present

## 2017-06-14 DIAGNOSIS — G4700 Insomnia, unspecified: Secondary | ICD-10-CM | POA: Diagnosis not present

## 2017-06-14 DIAGNOSIS — H4010X0 Unspecified open-angle glaucoma, stage unspecified: Secondary | ICD-10-CM | POA: Diagnosis not present

## 2017-06-14 DIAGNOSIS — I10 Essential (primary) hypertension: Secondary | ICD-10-CM | POA: Diagnosis not present

## 2017-06-14 DIAGNOSIS — D509 Iron deficiency anemia, unspecified: Secondary | ICD-10-CM | POA: Diagnosis not present

## 2017-06-14 DIAGNOSIS — M810 Age-related osteoporosis without current pathological fracture: Secondary | ICD-10-CM | POA: Diagnosis not present

## 2017-06-14 DIAGNOSIS — K219 Gastro-esophageal reflux disease without esophagitis: Secondary | ICD-10-CM | POA: Diagnosis not present

## 2017-06-14 DIAGNOSIS — E785 Hyperlipidemia, unspecified: Secondary | ICD-10-CM | POA: Diagnosis not present

## 2017-06-14 DIAGNOSIS — F0150 Vascular dementia without behavioral disturbance: Secondary | ICD-10-CM | POA: Diagnosis not present

## 2017-06-16 ENCOUNTER — Encounter (HOSPITAL_COMMUNITY): Payer: Self-pay | Admitting: Emergency Medicine

## 2017-06-16 ENCOUNTER — Inpatient Hospital Stay (HOSPITAL_COMMUNITY)
Admission: EM | Admit: 2017-06-16 | Discharge: 2017-06-19 | DRG: 481 | Disposition: A | Discharge status: Skilled Nursing Facility | Payer: Medicare Other | Attending: Internal Medicine | Admitting: Internal Medicine

## 2017-06-16 ENCOUNTER — Inpatient Hospital Stay (HOSPITAL_COMMUNITY): Payer: Medicare Other

## 2017-06-16 ENCOUNTER — Emergency Department (HOSPITAL_COMMUNITY): Payer: Medicare Other

## 2017-06-16 ENCOUNTER — Encounter (HOSPITAL_COMMUNITY)
Admission: EM | Disposition: A | Discharge status: Skilled Nursing Facility | Payer: Self-pay | Source: Home / Self Care | Attending: Internal Medicine

## 2017-06-16 ENCOUNTER — Inpatient Hospital Stay (HOSPITAL_COMMUNITY): Payer: Medicare Other | Admitting: Certified Registered"

## 2017-06-16 DIAGNOSIS — Z8673 Personal history of transient ischemic attack (TIA), and cerebral infarction without residual deficits: Secondary | ICD-10-CM | POA: Diagnosis not present

## 2017-06-16 DIAGNOSIS — R05 Cough: Secondary | ICD-10-CM

## 2017-06-16 DIAGNOSIS — M1711 Unilateral primary osteoarthritis, right knee: Secondary | ICD-10-CM | POA: Diagnosis present

## 2017-06-16 DIAGNOSIS — I10 Essential (primary) hypertension: Secondary | ICD-10-CM | POA: Diagnosis not present

## 2017-06-16 DIAGNOSIS — K210 Gastro-esophageal reflux disease with esophagitis: Secondary | ICD-10-CM | POA: Diagnosis not present

## 2017-06-16 DIAGNOSIS — D649 Anemia, unspecified: Secondary | ICD-10-CM | POA: Diagnosis present

## 2017-06-16 DIAGNOSIS — W1830XA Fall on same level, unspecified, initial encounter: Secondary | ICD-10-CM | POA: Diagnosis present

## 2017-06-16 DIAGNOSIS — F05 Delirium due to known physiological condition: Secondary | ICD-10-CM | POA: Diagnosis not present

## 2017-06-16 DIAGNOSIS — Z8261 Family history of arthritis: Secondary | ICD-10-CM | POA: Diagnosis not present

## 2017-06-16 DIAGNOSIS — S72102A Unspecified trochanteric fracture of left femur, initial encounter for closed fracture: Secondary | ICD-10-CM | POA: Diagnosis not present

## 2017-06-16 DIAGNOSIS — H409 Unspecified glaucoma: Secondary | ICD-10-CM | POA: Diagnosis not present

## 2017-06-16 DIAGNOSIS — E876 Hypokalemia: Secondary | ICD-10-CM | POA: Diagnosis not present

## 2017-06-16 DIAGNOSIS — K219 Gastro-esophageal reflux disease without esophagitis: Secondary | ICD-10-CM | POA: Diagnosis not present

## 2017-06-16 DIAGNOSIS — R059 Cough, unspecified: Secondary | ICD-10-CM

## 2017-06-16 DIAGNOSIS — F5101 Primary insomnia: Secondary | ICD-10-CM | POA: Diagnosis not present

## 2017-06-16 DIAGNOSIS — Z419 Encounter for procedure for purposes other than remedying health state, unspecified: Secondary | ICD-10-CM

## 2017-06-16 DIAGNOSIS — T17908A Unspecified foreign body in respiratory tract, part unspecified causing other injury, initial encounter: Secondary | ICD-10-CM | POA: Diagnosis present

## 2017-06-16 DIAGNOSIS — D62 Acute posthemorrhagic anemia: Secondary | ICD-10-CM | POA: Diagnosis not present

## 2017-06-16 DIAGNOSIS — S0990XA Unspecified injury of head, initial encounter: Secondary | ICD-10-CM | POA: Diagnosis not present

## 2017-06-16 DIAGNOSIS — K5900 Constipation, unspecified: Secondary | ICD-10-CM | POA: Diagnosis not present

## 2017-06-16 DIAGNOSIS — Z833 Family history of diabetes mellitus: Secondary | ICD-10-CM

## 2017-06-16 DIAGNOSIS — S728X9A Other fracture of unspecified femur, initial encounter for closed fracture: Secondary | ICD-10-CM | POA: Diagnosis not present

## 2017-06-16 DIAGNOSIS — R1310 Dysphagia, unspecified: Secondary | ICD-10-CM | POA: Diagnosis present

## 2017-06-16 DIAGNOSIS — L988 Other specified disorders of the skin and subcutaneous tissue: Secondary | ICD-10-CM | POA: Diagnosis not present

## 2017-06-16 DIAGNOSIS — F0150 Vascular dementia without behavioral disturbance: Secondary | ICD-10-CM | POA: Diagnosis present

## 2017-06-16 DIAGNOSIS — Z66 Do not resuscitate: Secondary | ICD-10-CM | POA: Diagnosis not present

## 2017-06-16 DIAGNOSIS — N400 Enlarged prostate without lower urinary tract symptoms: Secondary | ICD-10-CM | POA: Diagnosis not present

## 2017-06-16 DIAGNOSIS — I959 Hypotension, unspecified: Secondary | ICD-10-CM | POA: Diagnosis not present

## 2017-06-16 DIAGNOSIS — Z800 Family history of malignant neoplasm of digestive organs: Secondary | ICD-10-CM

## 2017-06-16 DIAGNOSIS — Z86010 Personal history of colonic polyps: Secondary | ICD-10-CM

## 2017-06-16 DIAGNOSIS — R52 Pain, unspecified: Secondary | ICD-10-CM | POA: Diagnosis not present

## 2017-06-16 DIAGNOSIS — R262 Difficulty in walking, not elsewhere classified: Secondary | ICD-10-CM | POA: Diagnosis not present

## 2017-06-16 DIAGNOSIS — W19XXXA Unspecified fall, initial encounter: Secondary | ICD-10-CM

## 2017-06-16 DIAGNOSIS — G8911 Acute pain due to trauma: Secondary | ICD-10-CM | POA: Diagnosis not present

## 2017-06-16 DIAGNOSIS — S72142A Displaced intertrochanteric fracture of left femur, initial encounter for closed fracture: Principal | ICD-10-CM

## 2017-06-16 DIAGNOSIS — Z87891 Personal history of nicotine dependence: Secondary | ICD-10-CM | POA: Diagnosis not present

## 2017-06-16 DIAGNOSIS — D509 Iron deficiency anemia, unspecified: Secondary | ICD-10-CM | POA: Diagnosis present

## 2017-06-16 DIAGNOSIS — Z823 Family history of stroke: Secondary | ICD-10-CM

## 2017-06-16 DIAGNOSIS — T17908S Unspecified foreign body in respiratory tract, part unspecified causing other injury, sequela: Secondary | ICD-10-CM | POA: Diagnosis not present

## 2017-06-16 DIAGNOSIS — S299XXA Unspecified injury of thorax, initial encounter: Secondary | ICD-10-CM | POA: Diagnosis not present

## 2017-06-16 DIAGNOSIS — E785 Hyperlipidemia, unspecified: Secondary | ICD-10-CM | POA: Diagnosis present

## 2017-06-16 DIAGNOSIS — Z9181 History of falling: Secondary | ICD-10-CM | POA: Diagnosis not present

## 2017-06-16 DIAGNOSIS — Z4789 Encounter for other orthopedic aftercare: Secondary | ICD-10-CM | POA: Diagnosis not present

## 2017-06-16 DIAGNOSIS — R1312 Dysphagia, oropharyngeal phase: Secondary | ICD-10-CM | POA: Diagnosis not present

## 2017-06-16 DIAGNOSIS — Z825 Family history of asthma and other chronic lower respiratory diseases: Secondary | ICD-10-CM | POA: Diagnosis not present

## 2017-06-16 DIAGNOSIS — J690 Pneumonitis due to inhalation of food and vomit: Secondary | ICD-10-CM | POA: Diagnosis not present

## 2017-06-16 DIAGNOSIS — R0602 Shortness of breath: Secondary | ICD-10-CM | POA: Diagnosis not present

## 2017-06-16 DIAGNOSIS — R001 Bradycardia, unspecified: Secondary | ICD-10-CM | POA: Diagnosis present

## 2017-06-16 DIAGNOSIS — N401 Enlarged prostate with lower urinary tract symptoms: Secondary | ICD-10-CM | POA: Diagnosis not present

## 2017-06-16 DIAGNOSIS — R451 Restlessness and agitation: Secondary | ICD-10-CM | POA: Diagnosis not present

## 2017-06-16 DIAGNOSIS — Z8249 Family history of ischemic heart disease and other diseases of the circulatory system: Secondary | ICD-10-CM

## 2017-06-16 DIAGNOSIS — M25552 Pain in left hip: Secondary | ICD-10-CM | POA: Diagnosis not present

## 2017-06-16 DIAGNOSIS — S72141D Displaced intertrochanteric fracture of right femur, subsequent encounter for closed fracture with routine healing: Secondary | ICD-10-CM | POA: Diagnosis not present

## 2017-06-16 DIAGNOSIS — S199XXA Unspecified injury of neck, initial encounter: Secondary | ICD-10-CM | POA: Diagnosis not present

## 2017-06-16 DIAGNOSIS — R278 Other lack of coordination: Secondary | ICD-10-CM | POA: Diagnosis not present

## 2017-06-16 DIAGNOSIS — S72009A Fracture of unspecified part of neck of unspecified femur, initial encounter for closed fracture: Secondary | ICD-10-CM | POA: Diagnosis present

## 2017-06-16 DIAGNOSIS — Z79899 Other long term (current) drug therapy: Secondary | ICD-10-CM | POA: Diagnosis not present

## 2017-06-16 DIAGNOSIS — E559 Vitamin D deficiency, unspecified: Secondary | ICD-10-CM | POA: Diagnosis not present

## 2017-06-16 DIAGNOSIS — Z111 Encounter for screening for respiratory tuberculosis: Secondary | ICD-10-CM | POA: Diagnosis not present

## 2017-06-16 DIAGNOSIS — S72142D Displaced intertrochanteric fracture of left femur, subsequent encounter for closed fracture with routine healing: Secondary | ICD-10-CM | POA: Diagnosis not present

## 2017-06-16 DIAGNOSIS — M6281 Muscle weakness (generalized): Secondary | ICD-10-CM | POA: Diagnosis not present

## 2017-06-16 HISTORY — PX: INTRAMEDULLARY (IM) NAIL INTERTROCHANTERIC: SHX5875

## 2017-06-16 LAB — COMPREHENSIVE METABOLIC PANEL
ALT: 18 U/L (ref 17–63)
ANION GAP: 7 (ref 5–15)
AST: 18 U/L (ref 15–41)
Albumin: 3.3 g/dL — ABNORMAL LOW (ref 3.5–5.0)
Alkaline Phosphatase: 95 U/L (ref 38–126)
BUN: 12 mg/dL (ref 6–20)
CHLORIDE: 103 mmol/L (ref 101–111)
CO2: 28 mmol/L (ref 22–32)
Calcium: 9.3 mg/dL (ref 8.9–10.3)
Creatinine, Ser: 1.12 mg/dL (ref 0.61–1.24)
GFR calc Af Amer: >60 mL/min (ref >60)
GFR calc non Af Amer: 54 mL/min — ABNORMAL LOW (ref >60)
Glucose, Bld: 115 mg/dL — ABNORMAL HIGH (ref 65–99)
POTASSIUM: 3.5 mmol/L (ref 3.5–5.1)
SODIUM: 138 mmol/L (ref 135–145)
Total Bilirubin: 0.8 mg/dL (ref 0.3–1.2)
Total Protein: 6.5 g/dL (ref 6.5–8.1)

## 2017-06-16 LAB — RETICULOCYTES
RBC.: 3.52 MIL/uL — ABNORMAL LOW (ref 4.22–5.81)
RETIC COUNT ABSOLUTE: 38.7 10*3/uL (ref 19.0–186.0)
RETIC CT PCT: 1.1 % (ref 0.4–3.1)

## 2017-06-16 LAB — URINALYSIS, ROUTINE W REFLEX MICROSCOPIC
Bilirubin Urine: NEGATIVE
Glucose, UA: NEGATIVE mg/dL
Hgb urine dipstick: NEGATIVE
Ketones, ur: NEGATIVE mg/dL
LEUKOCYTES UA: NEGATIVE
Nitrite: NEGATIVE
PH: 7 (ref 5.0–8.0)
PROTEIN: NEGATIVE mg/dL
Specific Gravity, Urine: 1.013 (ref 1.005–1.030)

## 2017-06-16 LAB — CBC WITH DIFFERENTIAL/PLATELET
BASOS PCT: 0 %
Basophils Absolute: 0 10*3/uL (ref 0.0–0.1)
EOS ABS: 0.2 10*3/uL (ref 0.0–0.7)
Eosinophils Relative: 2 %
HCT: 34.2 % — ABNORMAL LOW (ref 39.0–52.0)
Hemoglobin: 10.9 g/dL — ABNORMAL LOW (ref 13.0–17.0)
Lymphocytes Relative: 16 %
Lymphs Abs: 1.3 10*3/uL (ref 0.7–4.0)
MCH: 30.8 pg (ref 26.0–34.0)
MCHC: 31.9 g/dL (ref 30.0–36.0)
MCV: 96.6 fL (ref 78.0–100.0)
MONO ABS: 0.8 10*3/uL (ref 0.1–1.0)
MONOS PCT: 10 %
NEUTROS PCT: 72 %
Neutro Abs: 6 10*3/uL (ref 1.7–7.7)
Platelets: 156 10*3/uL (ref 150–400)
RBC: 3.54 MIL/uL — ABNORMAL LOW (ref 4.22–5.81)
RDW: 15.7 % — AB (ref 11.5–15.5)
WBC: 8.3 10*3/uL (ref 4.0–10.5)

## 2017-06-16 LAB — TSH: TSH: 2.433 u[IU]/mL (ref 0.350–4.500)

## 2017-06-16 LAB — CK: Total CK: 73 U/L (ref 49–397)

## 2017-06-16 LAB — FOLATE: FOLATE: 11.8 ng/mL (ref >5.9)

## 2017-06-16 SURGERY — FIXATION, FRACTURE, INTERTROCHANTERIC, WITH INTRAMEDULLARY ROD
Anesthesia: General | Site: Hip | Laterality: Left

## 2017-06-16 MED ORDER — LIDOCAINE 2% (20 MG/ML) 5 ML SYRINGE
INTRAMUSCULAR | Status: DC | PRN
  Administered 2017-06-16: 100 mg via INTRAVENOUS

## 2017-06-16 MED ORDER — METHOCARBAMOL 1000 MG/10ML IJ SOLN
500.0000 mg | Freq: Four times a day (QID) | INTRAVENOUS | Status: DC | PRN
  Filled 2017-06-16: qty 5

## 2017-06-16 MED ORDER — HYDRALAZINE HCL 20 MG/ML IJ SOLN: 10.0000 mg | Freq: Four times a day (QID) | INTRAMUSCULAR | Status: DC | PRN

## 2017-06-16 MED ORDER — FENTANYL CITRATE (PF) 100 MCG/2ML IJ SOLN
INTRAMUSCULAR | Status: DC | PRN
  Administered 2017-06-16: 100 ug via INTRAVENOUS

## 2017-06-16 MED ORDER — CEFAZOLIN SODIUM-DEXTROSE 2-4 GM/100ML-% IV SOLN
2.0000 g | Freq: Four times a day (QID) | INTRAVENOUS | Status: AC
  Administered 2017-06-16 – 2017-06-17 (×2): 2 g via INTRAVENOUS
  Filled 2017-06-16 (×2): qty 100

## 2017-06-16 MED ORDER — CEFAZOLIN SODIUM-DEXTROSE 2-3 GM-%(50ML) IV SOLR
INTRAVENOUS | Status: DC | PRN
  Administered 2017-06-16: 2 g via INTRAVENOUS

## 2017-06-16 MED ORDER — 0.9 % SODIUM CHLORIDE (POUR BTL) OPTIME
TOPICAL | Status: DC | PRN
  Administered 2017-06-16: 1000 mL

## 2017-06-16 MED ORDER — PROPOFOL 10 MG/ML IV BOLUS
INTRAVENOUS | Status: DC | PRN
  Administered 2017-06-16: 80 mg via INTRAVENOUS

## 2017-06-16 MED ORDER — HYDRALAZINE HCL 20 MG/ML IJ SOLN: 5.0000 mg | Freq: Four times a day (QID) | INTRAMUSCULAR | Status: DC | PRN

## 2017-06-16 MED ORDER — GLYCOPYRROLATE 0.2 MG/ML IV SOSY
PREFILLED_SYRINGE | INTRAVENOUS | Status: DC | PRN
  Administered 2017-06-16: .2 mg via INTRAVENOUS

## 2017-06-16 MED ORDER — OCUVITE PO TABS: 1.0000 | ORAL_TABLET | Freq: Every day | ORAL | Status: DC

## 2017-06-16 MED ORDER — MEPERIDINE HCL 25 MG/ML IJ SOLN: 6.2500 mg | INTRAMUSCULAR | Status: DC | PRN

## 2017-06-16 MED ORDER — ACETAMINOPHEN 325 MG PO TABS: 650.0000 mg | ORAL_TABLET | Freq: Four times a day (QID) | ORAL | Status: DC | PRN

## 2017-06-16 MED ORDER — ACETAMINOPHEN 650 MG RE SUPP: 650.0000 mg | Freq: Four times a day (QID) | RECTAL | Status: DC | PRN

## 2017-06-16 MED ORDER — SODIUM CHLORIDE 0.9 % IV SOLN
INTRAVENOUS | Status: DC
  Administered 2017-06-16 – 2017-06-17 (×2): via INTRAVENOUS

## 2017-06-16 MED ORDER — HYDROMORPHONE HCL 1 MG/ML IJ SOLN
0.2500 mg | INTRAMUSCULAR | Status: DC | PRN
  Administered 2017-06-16 (×2): 0.25 mg via INTRAVENOUS

## 2017-06-16 MED ORDER — ROCURONIUM BROMIDE 10 MG/ML (PF) SYRINGE
PREFILLED_SYRINGE | INTRAVENOUS | Status: DC | PRN
  Administered 2017-06-16: 40 mg via INTRAVENOUS

## 2017-06-16 MED ORDER — SUGAMMADEX SODIUM 200 MG/2ML IV SOLN
INTRAVENOUS | Status: AC
  Filled 2017-06-16: qty 2

## 2017-06-16 MED ORDER — EPHEDRINE SULFATE-NACL 50-0.9 MG/10ML-% IV SOSY
PREFILLED_SYRINGE | INTRAVENOUS | Status: DC | PRN
  Administered 2017-06-16: 5 mg via INTRAVENOUS

## 2017-06-16 MED ORDER — BRINZOLAMIDE 1 % OP SUSP
1.0000 [drp] | Freq: Three times a day (TID) | OPHTHALMIC | Status: DC
  Administered 2017-06-16 – 2017-06-19 (×9): 1 [drp] via OPHTHALMIC
  Filled 2017-06-16: qty 10

## 2017-06-16 MED ORDER — BRIMONIDINE TARTRATE 0.15 % OP SOLN
1.0000 [drp] | Freq: Three times a day (TID) | OPHTHALMIC | Status: DC
  Administered 2017-06-16 – 2017-06-19 (×9): 1 [drp] via OPHTHALMIC
  Filled 2017-06-16: qty 5

## 2017-06-16 MED ORDER — MORPHINE SULFATE (PF) 4 MG/ML IV SOLN
0.5000 mg | INTRAVENOUS | Status: DC | PRN
  Administered 2017-06-16: 0.52 mg via INTRAVENOUS
  Filled 2017-06-16: qty 1

## 2017-06-16 MED ORDER — MENTHOL 3 MG MT LOZG: 1.0000 | LOZENGE | OROMUCOSAL | Status: DC | PRN

## 2017-06-16 MED ORDER — HYDROCODONE-ACETAMINOPHEN 5-325 MG PO TABS
1.0000 | ORAL_TABLET | Freq: Four times a day (QID) | ORAL | Status: DC | PRN
  Administered 2017-06-17: 1 via ORAL
  Administered 2017-06-18 (×2): 2 via ORAL
  Filled 2017-06-16: qty 1
  Filled 2017-06-16 (×2): qty 2

## 2017-06-16 MED ORDER — ONDANSETRON HCL 4 MG PO TABS: 4.0000 mg | ORAL_TABLET | Freq: Four times a day (QID) | ORAL | Status: DC | PRN

## 2017-06-16 MED ORDER — LATANOPROST 0.005 % OP SOLN
1.0000 [drp] | Freq: Every day | OPHTHALMIC | Status: DC
  Administered 2017-06-16 – 2017-06-18 (×3): 1 [drp] via OPHTHALMIC
  Filled 2017-06-16: qty 2.5

## 2017-06-16 MED ORDER — ONDANSETRON HCL 4 MG/2ML IJ SOLN
INTRAMUSCULAR | Status: DC | PRN
  Administered 2017-06-16: 4 mg via INTRAVENOUS

## 2017-06-16 MED ORDER — HYDROMORPHONE HCL 1 MG/ML IJ SOLN
INTRAMUSCULAR | Status: AC
  Administered 2017-06-16: 0.25 mg via INTRAVENOUS
  Filled 2017-06-16: qty 1

## 2017-06-16 MED ORDER — PHENOL 1.4 % MT LIQD: 1.0000 | OROMUCOSAL | Status: DC | PRN

## 2017-06-16 MED ORDER — PROPOFOL 10 MG/ML IV BOLUS
INTRAVENOUS | Status: AC
  Filled 2017-06-16: qty 20

## 2017-06-16 MED ORDER — ONDANSETRON HCL 4 MG/2ML IJ SOLN: 4.0000 mg | Freq: Once | INTRAMUSCULAR | Status: DC | PRN

## 2017-06-16 MED ORDER — MORPHINE SULFATE (PF) 2 MG/ML IV SOLN
0.5000 mg | INTRAVENOUS | Status: DC | PRN
  Administered 2017-06-18: 0.5 mg via INTRAVENOUS
  Filled 2017-06-16: qty 1

## 2017-06-16 MED ORDER — ASPIRIN EC 325 MG PO TBEC
325.0000 mg | DELAYED_RELEASE_TABLET | Freq: Every day | ORAL | Status: DC
  Administered 2017-06-17 – 2017-06-19 (×3): 325 mg via ORAL
  Filled 2017-06-16 (×3): qty 1

## 2017-06-16 MED ORDER — METHOCARBAMOL 500 MG PO TABS: 500.0000 mg | ORAL_TABLET | Freq: Four times a day (QID) | ORAL | Status: DC | PRN

## 2017-06-16 MED ORDER — METOCLOPRAMIDE HCL 5 MG/ML IJ SOLN: 5.0000 mg | Freq: Three times a day (TID) | INTRAMUSCULAR | Status: DC | PRN

## 2017-06-16 MED ORDER — DOXAZOSIN MESYLATE 4 MG PO TABS
4.0000 mg | ORAL_TABLET | Freq: Every day | ORAL | Status: DC
  Administered 2017-06-16: 4 mg via ORAL
  Filled 2017-06-16 (×2): qty 1

## 2017-06-16 MED ORDER — LACTATED RINGERS IV SOLN
INTRAVENOUS | Status: DC | PRN
  Administered 2017-06-16 (×2): via INTRAVENOUS

## 2017-06-16 MED ORDER — ONDANSETRON HCL 4 MG/2ML IJ SOLN: 4.0000 mg | Freq: Four times a day (QID) | INTRAMUSCULAR | Status: DC | PRN

## 2017-06-16 MED ORDER — SUGAMMADEX SODIUM 200 MG/2ML IV SOLN
INTRAVENOUS | Status: DC | PRN
  Administered 2017-06-16: 170 mg via INTRAVENOUS

## 2017-06-16 MED ORDER — FENTANYL CITRATE (PF) 250 MCG/5ML IJ SOLN
INTRAMUSCULAR | Status: AC
  Filled 2017-06-16: qty 5

## 2017-06-16 MED ORDER — FENTANYL CITRATE (PF) 100 MCG/2ML IJ SOLN
50.0000 ug | Freq: Once | INTRAMUSCULAR | Status: AC
  Administered 2017-06-16: 50 ug via INTRAVENOUS
  Filled 2017-06-16: qty 2

## 2017-06-16 MED ORDER — AMLODIPINE BESYLATE 10 MG PO TABS
10.0000 mg | ORAL_TABLET | Freq: Every day | ORAL | Status: DC
  Filled 2017-06-16: qty 1

## 2017-06-16 MED ORDER — METOCLOPRAMIDE HCL 10 MG PO TABS: 5.0000 mg | ORAL_TABLET | Freq: Three times a day (TID) | ORAL | Status: DC | PRN

## 2017-06-16 MED ORDER — DOCUSATE SODIUM 100 MG PO CAPS
100.0000 mg | ORAL_CAPSULE | Freq: Two times a day (BID) | ORAL | Status: DC
  Administered 2017-06-17 – 2017-06-19 (×5): 100 mg via ORAL
  Filled 2017-06-16 (×5): qty 1

## 2017-06-16 MED ORDER — HYDROCODONE-ACETAMINOPHEN 5-325 MG PO TABS: 1.0000 | ORAL_TABLET | Freq: Four times a day (QID) | ORAL | Status: DC | PRN

## 2017-06-16 SURGICAL SUPPLY — 46 items
BIT DRILL LAG SCREW (DRILL) IMPLANT
BLADE SURG 15 STRL LF DISP TIS (BLADE) ×1 IMPLANT
BLADE SURG 15 STRL SS (BLADE) ×3
BNDG GAUZE ELAST 4 BULKY (GAUZE/BANDAGES/DRESSINGS) ×3 IMPLANT
COVER PERINEAL POST (MISCELLANEOUS) ×3 IMPLANT
COVER SURGICAL LIGHT HANDLE (MISCELLANEOUS) ×3 IMPLANT
DRAPE STERI IOBAN 125X83 (DRAPES) ×3 IMPLANT
DRILL LAG SCREW (DRILL) ×3
DRSG MEPILEX BORDER 4X4 (GAUZE/BANDAGES/DRESSINGS) ×4 IMPLANT
DRSG MEPILEX BORDER 4X8 (GAUZE/BANDAGES/DRESSINGS) ×3 IMPLANT
DRSG PAD ABDOMINAL 8X10 ST (GAUZE/BANDAGES/DRESSINGS) ×6 IMPLANT
DURAPREP 26ML APPLICATOR (WOUND CARE) ×3 IMPLANT
ELECT REM PT RETURN 9FT ADLT (ELECTROSURGICAL) ×3
ELECTRODE REM PT RTRN 9FT ADLT (ELECTROSURGICAL) ×1 IMPLANT
FACESHIELD WRAPAROUND (MASK) ×3 IMPLANT
FACESHIELD WRAPAROUND OR TEAM (MASK) ×1 IMPLANT
GAUZE XEROFORM 1X8 LF (GAUZE/BANDAGES/DRESSINGS) ×2 IMPLANT
GAUZE XEROFORM 5X9 LF (GAUZE/BANDAGES/DRESSINGS) ×3 IMPLANT
GLOVE BIO SURGEON STRL SZ8 (GLOVE) ×3 IMPLANT
GLOVE BIOGEL PI IND STRL 8 (GLOVE) ×1 IMPLANT
GLOVE BIOGEL PI INDICATOR 8 (GLOVE) ×2
GLOVE ORTHO TXT STRL SZ7.5 (GLOVE) ×3 IMPLANT
GOWN STRL REUS W/ TWL LRG LVL3 (GOWN DISPOSABLE) ×1 IMPLANT
GOWN STRL REUS W/ TWL XL LVL3 (GOWN DISPOSABLE) ×2 IMPLANT
GOWN STRL REUS W/TWL LRG LVL3 (GOWN DISPOSABLE) ×3
GOWN STRL REUS W/TWL XL LVL3 (GOWN DISPOSABLE) ×6
GUIDEPIN 3.2X17.5 THRD DISP (PIN) ×2 IMPLANT
HFN LH 130 DEG 11MM X 380MM (Orthopedic Implant) ×2 IMPLANT
KIT BASIN OR (CUSTOM PROCEDURE TRAY) ×3 IMPLANT
KIT ROOM TURNOVER OR (KITS) ×3 IMPLANT
LINER BOOT UNIVERSAL DISP (MISCELLANEOUS) ×3 IMPLANT
MANIFOLD NEPTUNE II (INSTRUMENTS) ×3 IMPLANT
NS IRRIG 1000ML POUR BTL (IV SOLUTION) ×3 IMPLANT
PACK GENERAL/GYN (CUSTOM PROCEDURE TRAY) ×3 IMPLANT
PAD ARMBOARD 7.5X6 YLW CONV (MISCELLANEOUS) ×6 IMPLANT
PAD CAST 4YDX4 CTTN HI CHSV (CAST SUPPLIES) ×2 IMPLANT
PADDING CAST COTTON 4X4 STRL (CAST SUPPLIES) ×6
SCREW LAG 10.5MMX105MM HFN (Screw) ×2 IMPLANT
STAPLER VISISTAT 35W (STAPLE) ×3 IMPLANT
SUT VIC AB 0 CT1 27 (SUTURE) ×6
SUT VIC AB 0 CT1 27XBRD ANBCTR (SUTURE) ×2 IMPLANT
SUT VIC AB 2-0 CT1 27 (SUTURE) ×6
SUT VIC AB 2-0 CT1 TAPERPNT 27 (SUTURE) ×2 IMPLANT
TOWEL OR 17X24 6PK STRL BLUE (TOWEL DISPOSABLE) ×3 IMPLANT
TOWEL OR 17X26 10 PK STRL BLUE (TOWEL DISPOSABLE) ×3 IMPLANT
WATER STERILE IRR 1000ML POUR (IV SOLUTION) ×3 IMPLANT

## 2017-06-16 NOTE — Anesthesia Postprocedure Evaluation (Signed)
Anesthesia Post Note  Patient: Alexander Goodwin  Procedure(s) Performed: INTRAMEDULLARY (IM) NAIL INTERTROCHANTRIC LEFT HIP (Left Hip)     Patient location during evaluation: PACU Anesthesia Type: General Level of consciousness: awake and alert Pain management: pain level controlled Vital Signs Assessment: post-procedure vital signs reviewed and stable Respiratory status: spontaneous breathing, nonlabored ventilation, respiratory function stable and patient connected to nasal cannula oxygen Cardiovascular status: blood pressure returned to baseline and stable Postop Assessment: no apparent nausea or vomiting Anesthetic complications: no    Last Vitals:  Vitals:   06/16/17 1742 06/16/17 1745  BP: (!) 171/78 (!) 154/79  Pulse: 83 79  Resp: 18 17  Temp:    SpO2: 100% 99%    Last Pain:  Vitals:   06/16/17 1742  PainSc: 5     LLE Motor Response: Purposeful movement;Responds to commands (06/16/17 1745)            Joden Bonsall DAVID

## 2017-06-16 NOTE — Consult Note (Signed)
Reason for Consult:  Left hip fracture Referring Physician: Gareth Morgan, MD/EDP  Alexander Goodwin is an 83 y.o. male.  HPI: The patient is a 83 year old nursing home resident who sustained a mechanical fall earlier this morning landing on his left hip.  With left hip pain and the inability to ambulate he was brought to the [facility] and found to have an intertrochanteric left hip fracture.  Orthopedic surgery is consulted for further evaluation and treatment and the medicine service is consulted as well.  The patient is able to follow commands and is alert but not oriented.  He answers a lot of questions appropriately but some not.  He is able to respond to my verbal commands to move his feet and legs.  He does report left hip pain and does state that he had a fall.  I have been in contact with the patient's family which is mainly his son [name] who is in [place] right now.  The family is heading back to [facility] today.  The patient does report to me left hip pain and has pain with attempts of motion of his left hip by me.  Again there is definitely a component of dementia.  Past Medical History:  Diagnosis Date  . Abscess of lung without pneumonia ([place])    Right upper lobe chronic lung abscess and prior left upper lobe lung abscess due to chronic aspiration  . Arthritis    right knee, hips, neck. He denies any major limitation in activities  . BPH (benign prostatic hypertrophy) with urinary obstruction   . Dementia   . Diverticulitis of colon   . Dysphagia, oropharyngeal   . Esophagitis    Last EGD Fall '11   . GERD (gastroesophageal reflux disease)    well controlled with medication. No nocturnal symptoms on a regular basis  . Glaucoma    both eyes, followed closely by Dr. Janyth Contes  . History of colon polyps   . Hyperlipemia   . Hypertension   . Pancreatitis    resolved after GB surgery  . Seasonal allergies   . Stroke [facility])    TIAs - age 89    Past  Surgical History:  Procedure Laterality Date  . APPENDECTOMY     '88 along with [place]  . CHOLECYSTECTOMY     '88 - laparotomy ([place])  . VIDEO BRONCHOSCOPY  03/26/2012   Procedure: VIDEO BRONCHOSCOPY WITH FLUORO;  Surgeon: Elsie Stain, MD;  Location: [facility];  Service: Cardiopulmonary;  Laterality: Bilateral;    Family History  Problem Relation Age of Onset  . Diabetes Mother   . Hypertension Mother   . Heart disease Mother   . Stroke Father   . Heart disease Father   . Diabetes Father   . Rheum arthritis Father   . Arthritis Father   . Cancer Sister        uterine cancer  . COPD Brother   . Hypertension Brother   . Cancer Brother        colon. brain, and hip  . Diabetes Brother   . Cancer Brother   . Diabetes Brother   . Cancer Sister        gyn malignancy  . Cancer Sister        pancreatic cancer  . Allergies Sister   . Allergies Son   . Asthma Sister   . Asthma Son   . Diabetes Maternal Grandmother   .  Diabetes Maternal Grandfather   . Diabetes Paternal Grandmother   . Diabetes Paternal Grandfather     Social History:  reports that he quit smoking about 38 years ago. His smoking use included Pipe and Cigars. He has a 15.00 pack-year smoking history. He quit smokeless tobacco use about 26 years ago. His smokeless tobacco use included Chew. He reports that he does not drink alcohol or use drugs.  Allergies:  Allergies  Allergen Reactions  . Clindamycin/Lincomycin Itching  . Erythromycin Base Rash    Medications: I have reviewed the patient's current medications.  Results for orders placed or performed during the hospital encounter of 06/16/17 (from the past 48 hour(s))  CBC with Differential     Status: Abnormal   Collection Time: 06/16/17  7:04 AM  Result Value Ref Range   WBC 8.3 4.0 - 10.5 K/uL   RBC 3.54 (L) 4.22 - 5.81 MIL/uL   Hemoglobin 10.9 (L) 13.0 - 17.0 g/dL   HCT 34.2 (L) 39.0 - 52.0 %   MCV 96.6 78.0 - 100.0 fL    MCH 30.8 26.0 - 34.0 pg   MCHC 31.9 30.0 - 36.0 g/dL   RDW 15.7 (H) 11.5 - 15.5 %   Platelets 156 150 - 400 K/uL   Neutrophils Relative % 72 %   Neutro Abs 6.0 1.7 - 7.7 K/uL   Lymphocytes Relative 16 %   Lymphs Abs 1.3 0.7 - 4.0 K/uL   Monocytes Relative 10 %   Monocytes Absolute 0.8 0.1 - 1.0 K/uL   Eosinophils Relative 2 %   Eosinophils Absolute 0.2 0.0 - 0.7 K/uL   Basophils Relative 0 %   Basophils Absolute 0.0 0.0 - 0.1 K/uL  Comprehensive metabolic panel     Status: Abnormal   Collection Time: 06/16/17  7:04 AM  Result Value Ref Range   Sodium 138 135 - 145 mmol/L   Potassium 3.5 3.5 - 5.1 mmol/L   Chloride 103 101 - 111 mmol/L   CO2 28 22 - 32 mmol/L   Glucose, Bld 115 (H) 65 - 99 mg/dL   BUN 12 6 - 20 mg/dL   Creatinine, Ser 1.12 0.61 - 1.24 mg/dL   Calcium 9.3 8.9 - 10.3 mg/dL   Total Protein 6.5 6.5 - 8.1 g/dL   Albumin 3.3 (L) 3.5 - 5.0 g/dL   AST 18 15 - 41 U/L   ALT 18 17 - 63 U/L   Alkaline Phosphatase 95 38 - 126 U/L   Total Bilirubin 0.8 0.3 - 1.2 mg/dL   GFR calc non Af Amer 54 (L) >60 mL/min   GFR calc Af Amer >60 >60 mL/min    Comment: (NOTE) The eGFR has been calculated using the CKD EPI equation. This calculation has not been validated in all clinical situations. eGFR's persistently <60 mL/min signify possible Chronic Kidney Disease.    Anion gap 7 5 - 15  Urinalysis, Routine w reflex microscopic     Status: None   Collection Time: 06/16/17  9:20 AM  Result Value Ref Range   Color, Urine YELLOW YELLOW   APPearance CLEAR CLEAR   Specific Gravity, Urine 1.013 1.005 - 1.030   pH 7.0 5.0 - 8.0   Glucose, UA NEGATIVE NEGATIVE mg/dL   Hgb urine dipstick NEGATIVE NEGATIVE   Bilirubin Urine NEGATIVE NEGATIVE   Ketones, ur NEGATIVE NEGATIVE mg/dL   Protein, ur NEGATIVE NEGATIVE mg/dL   Nitrite NEGATIVE NEGATIVE   Leukocytes, UA NEGATIVE NEGATIVE    Dg Chest 1  View  Result Date: 06/16/2017 CLINICAL DATA:  Status post unwitnessed fall, with  concern for chest injury. Initial encounter. EXAM: CHEST 1 VIEW COMPARISON:  Chest radiograph performed 03/12/2016 FINDINGS: The lungs are hypoexpanded. Right-sided airspace opacities are better characterized on prior studies due to current lung hypoexpansion and positioning. These are difficult to compare with the prior studies. No pleural effusion or pneumothorax is seen. The cardiomediastinal silhouettes is borderline enlarged. No acute osseous abnormalities are identified. IMPRESSION: 1. No displaced rib fracture seen. 2. Lungs hypoexpanded. Right-sided airspace opacities again noted, better characterized on prior studies due to current lung hypoexpansion and positioning. 3. Borderline cardiomegaly. Electronically Signed   By: Garald Balding M.D.   On: 06/16/2017 07:07   Ct Head Wo Contrast  Result Date: 06/16/2017 CLINICAL DATA:  Unwitnessed fall.  Initial encounter. EXAM: CT HEAD WITHOUT CONTRAST CT CERVICAL SPINE WITHOUT CONTRAST TECHNIQUE: Multidetector CT imaging of the head and cervical spine was performed following the standard protocol without intravenous contrast. Multiplanar CT image reconstructions of the cervical spine were also generated. COMPARISON:  Head CT 02/06/2015 FINDINGS: CT HEAD FINDINGS Brain: Moderate generalized cerebral atrophy is unchanged. Periventricular white matter hypodensities are stable to minimally increased in are nonspecific but compatible with chronic small vessel ischemic disease, mild for age. There is no evidence of acute infarct, intracranial hemorrhage, mass, midline shift, or extra-axial fluid collection. Vascular: Calcified atherosclerosis at the skullbase. No hyperdense vessel. Skull: No fracture or focal osseous lesion. Sinuses/Orbits: Bilateral cataract extraction. Moderate bilateral ethmoid air cell opacification. Milder bilateral frontal sinus mucosal thickening. Clear mastoid air cells. Other: None. CT CERVICAL SPINE FINDINGS Alignment: Reversal of the  normal cervical lordosis. 2 mm anterolisthesis of C7 on T1, likely degenerative given facet arthrosis. Skull base and vertebrae: No evidence of acute fracture or destructive osseous lesion. Soft tissues and spinal canal: No prevertebral fluid or swelling. No visible canal hematoma. Disc levels: Advanced disc degeneration throughout the cervical spine. Interbody ankylosis at C2-3 and C3-4. Facet ankylosis bilaterally at C2-3 and on the right at C3-4. Degenerative endplate sclerosis and spurring from C4-C7. Asymmetric moderate left facet arthrosis at C7-T1. Upper chest: Partially visualized pleural thickening/ scarring in the right greater than left lung apices. Other: Calcified atherosclerosis at the carotid bifurcations. IMPRESSION: 1. No evidence of acute intracranial abnormality. 2. Cerebral atrophy and mild chronic small vessel ischemic disease. 3. No evidence of acute fracture or traumatic subluxation in the cervical spine. Advanced diffuse disc degeneration. Electronically Signed   By: Logan Bores M.D.   On: 06/16/2017 08:55   Ct Cervical Spine Wo Contrast  Result Date: 06/16/2017 CLINICAL DATA:  Unwitnessed fall.  Initial encounter. EXAM: CT HEAD WITHOUT CONTRAST CT CERVICAL SPINE WITHOUT CONTRAST TECHNIQUE: Multidetector CT imaging of the head and cervical spine was performed following the standard protocol without intravenous contrast. Multiplanar CT image reconstructions of the cervical spine were also generated. COMPARISON:  Head CT 02/06/2015 FINDINGS: CT HEAD FINDINGS Brain: Moderate generalized cerebral atrophy is unchanged. Periventricular white matter hypodensities are stable to minimally increased in are nonspecific but compatible with chronic small vessel ischemic disease, mild for age. There is no evidence of acute infarct, intracranial hemorrhage, mass, midline shift, or extra-axial fluid collection. Vascular: Calcified atherosclerosis at the skullbase. No hyperdense vessel. Skull: No  fracture or focal osseous lesion. Sinuses/Orbits: Bilateral cataract extraction. Moderate bilateral ethmoid air cell opacification. Milder bilateral frontal sinus mucosal thickening. Clear mastoid air cells. Other: None. CT CERVICAL SPINE FINDINGS Alignment: Reversal of the normal cervical lordosis.  2 mm anterolisthesis of C7 on T1, likely degenerative given facet arthrosis. Skull base and vertebrae: No evidence of acute fracture or destructive osseous lesion. Soft tissues and spinal canal: No prevertebral fluid or swelling. No visible canal hematoma. Disc levels: Advanced disc degeneration throughout the cervical spine. Interbody ankylosis at C2-3 and C3-4. Facet ankylosis bilaterally at C2-3 and on the right at C3-4. Degenerative endplate sclerosis and spurring from C4-C7. Asymmetric moderate left facet arthrosis at C7-T1. Upper chest: Partially visualized pleural thickening/ scarring in the right greater than left lung apices. Other: Calcified atherosclerosis at the carotid bifurcations. IMPRESSION: 1. No evidence of acute intracranial abnormality. 2. Cerebral atrophy and mild chronic small vessel ischemic disease. 3. No evidence of acute fracture or traumatic subluxation in the cervical spine. Advanced diffuse disc degeneration. Electronically Signed   By: Logan Bores M.D.   On: 06/16/2017 08:55   Dg Hip Unilat With Pelvis 2-3 Views Left  Result Date: 06/16/2017 CLINICAL DATA:  Status post fall, with left hip pain. Initial encounter. EXAM: DG HIP (WITH OR WITHOUT PELVIS) 2-3V LEFT COMPARISON:  None. FINDINGS: There is a mildly displaced left femoral intertrochanteric fracture. The left femoral head remains seated at the acetabulum. The right hip joint is unremarkable in appearance. Mild degenerative change is noted at the lower lumbar spine. The sacroiliac joints are grossly unremarkable. The visualized bowel gas pattern is unremarkable in appearance. Scattered vascular calcifications are seen. IMPRESSION:  1. Mildly displaced left femoral intertrochanteric fracture. 2. Scattered vascular calcifications seen. Electronically Signed   By: Garald Balding M.D.   On: 06/16/2017 07:05    ROS Blood pressure (!) 160/55, pulse (!) 50, resp. rate 16, SpO2 97 %. Physical Exam  Constitutional: He appears well-developed and well-nourished.  HENT:  Head: Normocephalic and atraumatic.  Eyes: Pupils are equal, round, and reactive to light. EOM are normal.  Neck: Normal range of motion. Neck supple.  Cardiovascular: Normal rate.   Respiratory: Effort normal and breath sounds normal.  GI: Soft. Bowel sounds are normal.  Musculoskeletal:       Left hip: He exhibits decreased range of motion, decreased strength, tenderness and bony tenderness.  Neurological: He is alert.  Skin: Skin is warm and dry.  Psychiatric: He has a normal mood and affect.    Assessment/Plan: Left displaced intertrochanteric hip/proximal femur fracture  1) I spoke with the patient about the potential for surgery the patient has some understanding but obviously there is some dementia.  I did get his son on the phone who is the power of attorney.  His son is currently in [facility] and is driving back here today.  I explained the recommendations for surgery as well as a thorough discussion of risk and benefits of were involved with fixing a broken hip in elderly population.  We talked about the risk of acute blood loss anemia, nerve and vessel injury, DVT, and perioperative risk assessment and discussion.  We talked about the goals of decreased pain, improve mobility, and overall improve quality of life.  The goal would be to get his hip fixed hopefully later this afternoon when the family is back in town.  The patient is to be seen and evaluated by the medical service for clearance for surgery.  He will likely then need to be here for a few days prior to returning to skilled nursing.  Mcarthur Rossetti 06/16/2017,  10:12 AM

## 2017-06-16 NOTE — ED Provider Notes (Signed)
[facility] EMERGENCY DEPARTMENT Provider Note   CSN: [id number] Arrival date & time: 06/16/17  0609     History   Chief Complaint Chief Complaint  Patient presents with  . Fall  . Hip Pain    HPI Alexander Goodwin is a 83 y.o. male.  HPI   83 year old male with a history of hypertension, hyperlipidemia, CVA, glaucoma, dementia residing in [place], presents with concern for unwitnessed fall with left hip pain.    History is limited by patient's dementia.  Level 5 caveat.  Patient reports he fell sometime last night, is unclear of the timing.  Reports he does not know how long he was laying on the ground.  Does not think it was all night.  Reports severe left hip pain.  Denies pain in other locations.  Reports he is in normal state of health yesterday, denies fevers, headache, back pain, cough, nausea, vomiting, urinary symptoms, numbness or weakness on one side or the other.  Does not remember how he fell, or whether he lost consciousness.  Past Medical History:  Diagnosis Date  . Abscess of lung without pneumonia ([place])    Right upper lobe chronic lung abscess and prior left upper lobe lung abscess due to chronic aspiration  . Arthritis    right knee, hips, neck. He denies any major limitation in activities  . BPH (benign prostatic hypertrophy) with urinary obstruction   . Dementia   . Diverticulitis of colon   . Dysphagia, oropharyngeal   . Esophagitis    Last EGD Fall '11   . GERD (gastroesophageal reflux disease)    well controlled with medication. No nocturnal symptoms on a regular basis  . Glaucoma    both eyes, followed closely by Dr. Janyth Contes  . History of colon polyps   . Hyperlipemia   . Hypertension   . Pancreatitis    resolved after GB surgery  . Seasonal allergies   . Stroke [facility])    TIAs - age 75    Patient Active Problem List   Diagnosis Date Noted  . Grief 07/19/2016  . Dementia, vascular 06/15/2015  . DNR (do not  resuscitate) 09/15/2014  . Branch retinal vein occlusion 09/02/2014  . Cloudy posterior capsule 04/10/2013  . Pseudoaphakia 04/10/2013  . 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 Primary open angle glaucoma 12/06/2011  . Hyperlipemia   . Hypertension   . History of colon polyps   . Arthritis   . GERD (gastroesophageal reflux disease)   . Glaucoma   . History of esophagitis   . History of TIA (transient ischemic attack)   . BPH (benign prostatic hyperplasia)     Past Surgical History:  Procedure Laterality Date  . APPENDECTOMY     '88 along with [place]  . CHOLECYSTECTOMY     '88 - laparotomy ([place])  . VIDEO BRONCHOSCOPY  03/26/2012   Procedure: VIDEO BRONCHOSCOPY WITH FLUORO;  Surgeon: Elsie Stain, MD;  Location: [facility];  Service: Cardiopulmonary;  Laterality: Bilateral;       Home Medications    Prior to Admission medications   Medication Sig Start Date End Date Taking? Authorizing Provider  acetaminophen (TYLENOL ARTHRITIS PAIN) 650 MG CR tablet Take 650 mg by mouth every 8 (eight) hours as needed for pain.     [provider]  acidophilus (RISAQUAD) CAPS capsule Take 1 capsule by mouth daily.  [provider]  amLODipine (NORVASC) 10 MG tablet Take 10 mg by mouth daily.      [provider]  beta carotene w/minerals (OCUVITE) tablet Take 1 tablet by mouth daily.    [provider]  brimonidine (ALPHAGAN P) 0.1 % SOLN Place 1 drop into both eyes 3 (three) times daily.     [provider]  brinzolamide (AZOPT) 1 % ophthalmic suspension Place 1 drop into both eyes 3 (three) times daily.     [provider]  calcium-vitamin D (OSCAL 500/200 D-3) 500-200 MG-UNIT per tablet Take 1 tablet by mouth daily.     [provider]  donepezil (ARICEPT) 10 MG tablet Take 10 mg by mouth at bedtime.    [provider]   doxazosin (CARDURA) 4 MG tablet TAKE 1 TABLET BY MOUTH NIGHTLY 02/01/16   Coral Spikes, DO  Iron, Ferrous Gluconate, 256 (28 Fe) MG TABS Take 1 tablet by mouth daily. 07/26/16   Coral Spikes, DO  latanoprost (XALATAN) 0.005 % ophthalmic solution Place 1 drop into both eyes at bedtime.     [provider]  omeprazole (PRILOSEC) 20 MG capsule Take 20 mg by mouth 2 (two) times daily.    [provider]  sucralfate (CARAFATE) 1 G tablet Take 1 tablet by mouth 2 (two) times daily before a meal. 01/17/15   [provider]    Family History Family History  Problem Relation Age of Onset  . Diabetes Mother   . Hypertension Mother   . Heart disease Mother   . Stroke Father   . Heart disease Father   . Diabetes Father   . Rheum arthritis Father   . Arthritis Father   . Cancer Sister        uterine cancer  . COPD Brother   . Hypertension Brother   . Cancer Brother        colon. brain, and hip  . Diabetes Brother   . Cancer Brother   . Diabetes Brother   . Cancer Sister        gyn malignancy  . Cancer Sister        pancreatic cancer  . Allergies Sister   . Allergies Son   . Asthma Sister   . Asthma Son   . Diabetes Maternal Grandmother   . Diabetes Maternal Grandfather   . Diabetes Paternal Grandmother   . Diabetes Paternal Grandfather     Social History Social History  Substance Use Topics  . Smoking status: Former Smoker    Packs/day: 0.50    Years: 30.00    Types: Pipe, Cigars    Quit date: 08/20/1978  . Smokeless tobacco: Former Systems developer    Types: Chew    Quit date: 11/16/1990  . Alcohol use No     Allergies   Clindamycin/lincomycin and Erythromycin base   Review of Systems Review of Systems  Constitutional: Negative for fever.  HENT: Negative for sore throat.   Eyes: Negative for visual disturbance.  Respiratory: Negative for shortness of breath.   Cardiovascular: Negative for chest pain.  Gastrointestinal: Negative for abdominal pain,  nausea and vomiting.  Genitourinary: Negative for difficulty urinating.  Musculoskeletal: Positive for arthralgias. Negative for back pain and neck stiffness.  Skin: Negative for rash.  Neurological: Negative for syncope and headaches.     Physical Exam Updated Vital Signs BP (!) 160/55   Pulse (!) 50   Resp 16   SpO2 97%   Physical Exam  Constitutional: He appears well-developed and well-nourished. No distress.  HENT:  Head: Normocephalic and atraumatic.  Eyes: Conjunctivae and EOM are normal.  Neck: Normal range of motion.  Cardiovascular: Normal rate, regular rhythm, normal heart sounds and intact distal pulses.  Exam reveals no gallop and no friction rub.   No murmur heard. Pulmonary/Chest: Effort normal and breath sounds normal. No respiratory distress. He has no wheezes. He has no rales.  Abdominal: Soft. He exhibits no distension. There is no tenderness. There is no guarding.  Musculoskeletal: He exhibits no edema.       Left hip: He exhibits decreased range of motion, tenderness and bony tenderness.  Feet warm bilaterally Right foot dominant DP pulse left foot PT pulse 2+ and unable to palpate DP or doppler signal Normal sensation Left leg shortened  Neurological: He is alert.  Oriented to self  Skin: Skin is warm and dry. He is not diaphoretic.  Skin tear left elbow   Nursing note and vitals reviewed.    ED Treatments / Results  Labs (all labs ordered are listed, but only abnormal results are displayed) Labs Reviewed  CBC WITH DIFFERENTIAL/PLATELET - Abnormal; Notable for the following:       Result Value   RBC 3.54 (*)    Hemoglobin 10.9 (*)    HCT 34.2 (*)    RDW 15.7 (*)    All other components within normal limits  COMPREHENSIVE METABOLIC PANEL - Abnormal; Notable for the following:    Glucose, Bld 115 (*)    Albumin 3.3 (*)    GFR calc non Af Amer 54 (*)    All other components within normal limits  URINALYSIS, ROUTINE W REFLEX MICROSCOPIC  CK      EKG  EKG Interpretation None       Radiology Dg Chest 1 View  Result Date: 06/16/2017 CLINICAL DATA:  Status post unwitnessed fall, with concern for chest injury. Initial encounter. EXAM: CHEST 1 VIEW COMPARISON:  Chest radiograph performed 03/12/2016 FINDINGS: The lungs are hypoexpanded. Right-sided airspace opacities are better characterized on prior studies due to current lung hypoexpansion and positioning. These are difficult to compare with the prior studies. No pleural effusion or pneumothorax is seen. The cardiomediastinal silhouettes is borderline enlarged. No acute osseous abnormalities are identified. IMPRESSION: 1. No displaced rib fracture seen. 2. Lungs hypoexpanded. Right-sided airspace opacities again noted, better characterized on prior studies due to current lung hypoexpansion and positioning. 3. Borderline cardiomegaly. Electronically Signed   By: Garald Balding M.D.   On: 06/16/2017 07:07   Ct Head Wo Contrast  Result Date: 06/16/2017 CLINICAL DATA:  Unwitnessed fall.  Initial encounter. EXAM: CT HEAD WITHOUT CONTRAST CT CERVICAL SPINE WITHOUT CONTRAST TECHNIQUE: Multidetector CT imaging of the head and cervical spine was performed following the standard protocol without intravenous contrast. Multiplanar CT image reconstructions of the cervical spine were also generated. COMPARISON:  Head CT 02/06/2015 FINDINGS: CT HEAD FINDINGS Brain: Moderate generalized cerebral atrophy is unchanged. Periventricular white matter hypodensities are stable to minimally increased in are nonspecific but compatible with chronic small vessel ischemic disease, mild for age. There is no evidence of acute infarct, intracranial hemorrhage, mass, midline shift, or extra-axial fluid collection. Vascular: Calcified atherosclerosis at the skullbase. No hyperdense vessel. Skull: No fracture or focal osseous lesion. Sinuses/Orbits: Bilateral cataract extraction. Moderate bilateral ethmoid air cell  opacification. Milder bilateral frontal sinus mucosal thickening. Clear mastoid air cells. Other: None. CT CERVICAL SPINE FINDINGS Alignment: Reversal of the normal cervical lordosis. 2 mm  anterolisthesis of C7 on T1, likely degenerative given facet arthrosis. Skull base and vertebrae: No evidence of acute fracture or destructive osseous lesion. Soft tissues and spinal canal: No prevertebral fluid or swelling. No visible canal hematoma. Disc levels: Advanced disc degeneration throughout the cervical spine. Interbody ankylosis at C2-3 and C3-4. Facet ankylosis bilaterally at C2-3 and on the right at C3-4. Degenerative endplate sclerosis and spurring from C4-C7. Asymmetric moderate left facet arthrosis at C7-T1. Upper chest: Partially visualized pleural thickening/ scarring in the right greater than left lung apices. Other: Calcified atherosclerosis at the carotid bifurcations. IMPRESSION: 1. No evidence of acute intracranial abnormality. 2. Cerebral atrophy and mild chronic small vessel ischemic disease. 3. No evidence of acute fracture or traumatic subluxation in the cervical spine. Advanced diffuse disc degeneration. Electronically Signed   By: Logan Bores M.D.   On: 06/16/2017 08:55   Ct Cervical Spine Wo Contrast  Result Date: 06/16/2017 CLINICAL DATA:  Unwitnessed fall.  Initial encounter. EXAM: CT HEAD WITHOUT CONTRAST CT CERVICAL SPINE WITHOUT CONTRAST TECHNIQUE: Multidetector CT imaging of the head and cervical spine was performed following the standard protocol without intravenous contrast. Multiplanar CT image reconstructions of the cervical spine were also generated. COMPARISON:  Head CT 02/06/2015 FINDINGS: CT HEAD FINDINGS Brain: Moderate generalized cerebral atrophy is unchanged. Periventricular white matter hypodensities are stable to minimally increased in are nonspecific but compatible with chronic small vessel ischemic disease, mild for age. There is no evidence of acute infarct, intracranial  hemorrhage, mass, midline shift, or extra-axial fluid collection. Vascular: Calcified atherosclerosis at the skullbase. No hyperdense vessel. Skull: No fracture or focal osseous lesion. Sinuses/Orbits: Bilateral cataract extraction. Moderate bilateral ethmoid air cell opacification. Milder bilateral frontal sinus mucosal thickening. Clear mastoid air cells. Other: None. CT CERVICAL SPINE FINDINGS Alignment: Reversal of the normal cervical lordosis. 2 mm anterolisthesis of C7 on T1, likely degenerative given facet arthrosis. Skull base and vertebrae: No evidence of acute fracture or destructive osseous lesion. Soft tissues and spinal canal: No prevertebral fluid or swelling. No visible canal hematoma. Disc levels: Advanced disc degeneration throughout the cervical spine. Interbody ankylosis at C2-3 and C3-4. Facet ankylosis bilaterally at C2-3 and on the right at C3-4. Degenerative endplate sclerosis and spurring from C4-C7. Asymmetric moderate left facet arthrosis at C7-T1. Upper chest: Partially visualized pleural thickening/ scarring in the right greater than left lung apices. Other: Calcified atherosclerosis at the carotid bifurcations. IMPRESSION: 1. No evidence of acute intracranial abnormality. 2. Cerebral atrophy and mild chronic small vessel ischemic disease. 3. No evidence of acute fracture or traumatic subluxation in the cervical spine. Advanced diffuse disc degeneration. Electronically Signed   By: Logan Bores M.D.   On: 06/16/2017 08:55   Dg Hip Unilat With Pelvis 2-3 Views Left  Result Date: 06/16/2017 CLINICAL DATA:  Status post fall, with left hip pain. Initial encounter. EXAM: DG HIP (WITH OR WITHOUT PELVIS) 2-3V LEFT COMPARISON:  None. FINDINGS: There is a mildly displaced left femoral intertrochanteric fracture. The left femoral head remains seated at the acetabulum. The right hip joint is unremarkable in appearance. Mild degenerative change is noted at the lower lumbar spine. The sacroiliac  joints are grossly unremarkable. The visualized bowel gas pattern is unremarkable in appearance. Scattered vascular calcifications are seen. IMPRESSION: 1. Mildly displaced left femoral intertrochanteric fracture. 2. Scattered vascular calcifications seen. Electronically Signed   By: Garald Balding M.D.   On: 06/16/2017 07:05    Procedures Procedures (including critical care time)  Medications Ordered in ED Medications  fentaNYL (SUBLIMAZE) injection 50 mcg (50 mcg Intravenous Given 06/16/17 0846)     Initial Impression / Assessment and Plan / ED Course  I have reviewed the triage vital signs and the nursing notes.  Pertinent labs & imaging results that were available during my care of the patient were reviewed by me and considered in my medical decision making (see chart for details).     83 year old male with a history of hypertension, hyperlipidemia, CVA, glaucoma, dementia residing in [place], presents with concern for unwitnessed fall with left hip pain.  Patient does not remember the details surrounding the fall.  CT head and CSpine was done which showed no acute abnormalities. Labs without significant changes.  Patient denies any other symptoms.    XR done shows left intertrochanteric hip fracture. Patient NV intact. Consulted Orthopedics, Dr.Blackman, who will come to evaluate the patient. Will keep NPO for possible surgery today.   Admitted to hospitalist for further care.  Final Clinical Impressions(s) / ED Diagnoses   Final diagnoses:  Closed intertrochanteric fracture of hip, left, initial encounter [facility])  Fall, initial encounter    New Prescriptions New Prescriptions   No medications on file     Gareth Morgan, MD 06/16/17 1843

## 2017-06-16 NOTE — ED Triage Notes (Signed)
Pt BIB [facility] for a fall, unwitnessed. Pt does not remember what caused the fall. He is from [place]. Was c/o of left hip pain. Pt has dementia and is at base line per facility. Skin tear to left elbow. 31mcg of fentanyl given en route.

## 2017-06-16 NOTE — H&P (Signed)
History and Physical    Alexander Goodwin:[id number] DOB: August 23, 1920 DOA: 06/16/2017  PCP: Coral Spikes, DO Patient coming from:   Chief Complaint: facility  HPI: Alexander Goodwin is a very pleasant 83 y.o. male with medical history significant for hypertension, hyperlipidemia, vascular dementia, right upper lobe lung abscess/chronic aspiration, dysphagia, BPH, GERD, glaucoma resents to the emergency department from a facility with chief complaint of fall and left hip pain.  Initial evaluation reveals left hip fracture.  [facility] are asked to admit  Information is obtained from the patient and the chart noting that information from patient is unreliable due to dementia.  He states he "remembers falling" but does not remember details.  He complains of pain in his left hip worse with movement.  He denies pain anywhere else.  There is no record of him being ill recently.  No reports of fever chills nausea vomiting diarrhea.  Patient denies dysuria hematuria frequency or urgency.  Chart does indicate he has a history of an unsteady gait.  Unclear how long he was on the floor.  Patient denies chest pain palpitations shortness of breath.    ED Course: In the emergency department he is afebrile hemodynamically stable and not hypoxic.  Heart rate low end of normal.  Provided with analgesia  Review of Systems: As per HPI otherwise all other systems reviewed and are negative.   Ambulatory Status: Review indicate patient ambulates with an unsteady gait.  Is a tendency to wander  Past Medical History:  Diagnosis Date  . Abscess of lung without pneumonia ([place])    Right upper lobe chronic lung abscess and prior left upper lobe lung abscess due to chronic aspiration  . Arthritis    right knee, hips, neck. He denies any major limitation in activities  . BPH (benign prostatic hypertrophy) with urinary obstruction   . Dementia   . Diverticulitis of colon   . Dysphagia, oropharyngeal   .  Esophagitis    Last EGD Fall '11   . GERD (gastroesophageal reflux disease)    well controlled with medication. No nocturnal symptoms on a regular basis  . Glaucoma    both eyes, followed closely by Dr. Janyth Contes  . History of colon polyps   . Hyperlipemia   . Hypertension   . Pancreatitis    resolved after GB surgery  . Seasonal allergies   . Stroke [facility])    TIAs - age 65    Past Surgical History:  Procedure Laterality Date  . APPENDECTOMY     '88 along with [place]  . CHOLECYSTECTOMY     '88 - laparotomy ([place])  . VIDEO BRONCHOSCOPY  03/26/2012   Procedure: VIDEO BRONCHOSCOPY WITH FLUORO;  Surgeon: Elsie Stain, MD;  Location: [facility];  Service: Cardiopulmonary;  Laterality: Bilateral;    Social History   Social History  . Marital status: Married    Spouse name: N/A  . Number of children: 3  . Years of education: HSG   Occupational History  . 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 Smoking status: Former Smoker    Packs/day: 0.50    Years: 30.00    Types: Pipe, Cigars    Quit date: 08/20/1978  . Smokeless tobacco: Former Systems developer    Types: Chew    Quit date: 11/16/1990  . Alcohol use No  . Drug use: No  . Sexual  activity: Yes   Other Topics Concern  . Not on file   Social History Narrative   HSG.   Married 1948. 3 sons - '52, '57,'58- 5 grandchildren, one deceased at 42 - OD.  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 Marriage in good health. End of Life Care -DNR/DNI,  No prolonged heroic or futile measures.     Allergies  Allergen Reactions  . Clindamycin/Lincomycin Itching  . Erythromycin Base Rash    Family History  Problem Relation Age of Onset  . Diabetes Mother   . Hypertension Mother   . Heart disease Mother   . Stroke Father   . Heart disease Father   . Diabetes Father   . Rheum arthritis Father   . Arthritis Father   . Cancer Sister          uterine cancer  . COPD Brother   . Hypertension Brother   . Cancer Brother        colon. brain, and hip  . Diabetes Brother   . Cancer Brother   . Diabetes Brother   . Cancer Sister        gyn malignancy  . Cancer Sister        pancreatic cancer  . Allergies Sister   . Allergies Son   . Asthma Sister   . Asthma Son   . Diabetes Maternal Grandmother   . Diabetes Maternal Grandfather   . Diabetes Paternal Grandmother   . Diabetes Paternal Grandfather     Prior to Admission medications   Medication Sig Start Date End Date Taking? Authorizing Provider  acetaminophen (TYLENOL ARTHRITIS PAIN) 650 MG CR tablet Take 650 mg by mouth every 8 (eight) hours as needed for pain.     [provider]  acidophilus (RISAQUAD) CAPS capsule Take 1 capsule by mouth daily.    [provider]  amLODipine (NORVASC) 10 MG tablet Take 10 mg by mouth daily.      [provider]  beta carotene w/minerals (OCUVITE) tablet Take 1 tablet by mouth daily.    [provider]  brimonidine (ALPHAGAN P) 0.1 % SOLN Place 1 drop into both eyes 3 (three) times daily.     [provider]  brinzolamide (AZOPT) 1 % ophthalmic suspension Place 1 drop into both eyes 3 (three) times daily.     [provider]  calcium-vitamin D (OSCAL 500/200 D-3) 500-200 MG-UNIT per tablet Take 1 tablet by mouth daily.     [provider]  donepezil (ARICEPT) 10 MG tablet Take 10 mg by mouth at bedtime.    [provider]  doxazosin (CARDURA) 4 MG tablet TAKE 1 TABLET BY MOUTH NIGHTLY 02/01/16   Coral Spikes, DO  Iron, Ferrous Gluconate, 256 (28 Fe) MG TABS Take 1 tablet by mouth daily. 07/26/16   Coral Spikes, DO  latanoprost (XALATAN) 0.005 % ophthalmic solution Place 1 drop into both eyes at bedtime.     [provider]  omeprazole (PRILOSEC) 20 MG capsule Take 20 mg by mouth 2 (two) times daily.    [provider]  sucralfate (CARAFATE) 1 G  tablet Take 1 tablet by mouth 2 (two) times daily before a meal. 01/17/15   [provider]    Physical Exam: Vitals:   06/16/17 0615 06/16/17 0630 06/16/17 0645 06/16/17 0700  BP: 136/60 (!) 135/56 (!) 146/63 (!) 160/55  Pulse: (!) 50 (!) 50 (!) 50 (!) 50  Resp: 15  17 (!) 22 16  SpO2: 93% 93% 96% 97%     General:  Appears calm and comfortable, somewhat pale chronically ill appearing in no acute distress Eyes:  PERRL, EOMI, normal lids, iris ENT:  grossly normal hearing, lips & tongue, mucous membranes of his mouth are pink slightly dry Neck:  no LAD, masses or thyromegaly Cardiovascular:  RRR, no m/r/g. No LE edema.  Pedal pulses present and palpable Respiratory:  CTA bilaterally, no w/r/r. Normal respiratory effort. Abdomen:  soft, ntnd, positive bowel sounds but somewhat sluggish no guarding or rebounding Skin:  no rash or induration seen on limited exam tear on his elbow Musculoskeletal: Leg externally rotated and shorter than right.  Left hip tender to palpation.  Left hip decreased range of motion due to pain Psychiatric:  grossly normal mood and affect, speech fluent and appropriate, AOx3 Neurologic: Patient oriented to self only.  Speech clear facial symmetry follows simple commands able to make simple wants and needs known  Labs on Admission: I have personally reviewed following labs and imaging studies  CBC:  Recent Labs Lab 06/16/17 0704  WBC 8.3  NEUTROABS 6.0  HGB 10.9*  HCT 34.2*  MCV 96.6  PLT 347   Basic Metabolic Panel:  Recent Labs Lab 06/16/17 0704  NA 138  K 3.5  CL 103  CO2 28  GLUCOSE 115*  BUN 12  CREATININE 1.12  CALCIUM 9.3   GFR: CrCl cannot be calculated (Unknown ideal weight.). Liver Function Tests:  Recent Labs Lab 06/16/17 0704  AST 18  ALT 18  ALKPHOS 95  BILITOT 0.8  PROT 6.5  ALBUMIN 3.3*   No results for input(s): LIPASE, AMYLASE in the last 168 hours. No results for input(s): AMMONIA in the last 168  hours. Coagulation Profile: No results for input(s): INR, PROTIME in the last 168 hours. Cardiac Enzymes: No results for input(s): CKTOTAL, CKMB, CKMBINDEX, TROPONINI in the last 168 hours. BNP (last 3 results) No results for input(s): PROBNP in the last 8760 hours. HbA1C: No results for input(s): HGBA1C in the last 72 hours. CBG: No results for input(s): GLUCAP in the last 168 hours. Lipid Profile: No results for input(s): CHOL, HDL, LDLCALC, TRIG, CHOLHDL, LDLDIRECT in the last 72 hours. Thyroid Function Tests: No results for input(s): TSH, T4TOTAL, FREET4, T3FREE, THYROIDAB in the last 72 hours. Anemia Panel: No results for input(s): VITAMINB12, FOLATE, FERRITIN, TIBC, IRON, RETICCTPCT in the last 72 hours. Urine analysis:    Component Value Date/Time   COLORURINE STRAW (A) 02/25/2016 1605   APPEARANCEUR CLEAR (A) 02/25/2016 1605   APPEARANCEUR Clear 10/06/2013 1117   LABSPEC 1.004 (L) 02/25/2016 1605   LABSPEC 1.019 10/06/2013 1117   PHURINE 6.0 02/25/2016 1605   GLUCOSEU NEGATIVE 02/25/2016 1605   GLUCOSEU Negative 10/06/2013 1117   HGBUR NEGATIVE 02/25/2016 1605   BILIRUBINUR NEGATIVE 02/25/2016 1605   BILIRUBINUR Negative 10/06/2013 1117   KETONESUR NEGATIVE 02/25/2016 1605   PROTEINUR NEGATIVE 02/25/2016 1605   UROBILINOGEN 0.2 07/13/2010 1133   NITRITE NEGATIVE 02/25/2016 1605   LEUKOCYTESUR NEGATIVE 02/25/2016 1605   LEUKOCYTESUR Negative 10/06/2013 1117    Creatinine Clearance: CrCl cannot be calculated (Unknown ideal weight.).  Sepsis Labs: @LABRCNTIP (procalcitonin:4,lacticidven:4) )No results found for this or any previous visit (from the past 240 hour(s)).   Radiological Exams on Admission: Dg Chest 1 View  Result Date: 06/16/2017 CLINICAL DATA:  Status post unwitnessed fall, with concern for chest injury. Initial encounter. EXAM: CHEST 1 VIEW COMPARISON:  Chest radiograph performed 03/12/2016  FINDINGS: The lungs are hypoexpanded. Right-sided airspace  opacities are better characterized on prior studies due to current lung hypoexpansion and positioning. These are difficult to compare with the prior studies. No pleural effusion or pneumothorax is seen. The cardiomediastinal silhouettes is borderline enlarged. No acute osseous abnormalities are identified. IMPRESSION: 1. No displaced rib fracture seen. 2. Lungs hypoexpanded. Right-sided airspace opacities again noted, better characterized on prior studies due to current lung hypoexpansion and positioning. 3. Borderline cardiomegaly. Electronically Signed   By: Garald Balding M.D.   On: 06/16/2017 07:07   Ct Head Wo Contrast  Result Date: 06/16/2017 CLINICAL DATA:  Unwitnessed fall.  Initial encounter. EXAM: CT HEAD WITHOUT CONTRAST CT CERVICAL SPINE WITHOUT CONTRAST TECHNIQUE: Multidetector CT imaging of the head and cervical spine was performed following the standard protocol without intravenous contrast. Multiplanar CT image reconstructions of the cervical spine were also generated. COMPARISON:  Head CT 02/06/2015 FINDINGS: CT HEAD FINDINGS Brain: Moderate generalized cerebral atrophy is unchanged. Periventricular white matter hypodensities are stable to minimally increased in are nonspecific but compatible with chronic small vessel ischemic disease, mild for age. There is no evidence of acute infarct, intracranial hemorrhage, mass, midline shift, or extra-axial fluid collection. Vascular: Calcified atherosclerosis at the skullbase. No hyperdense vessel. Skull: No fracture or focal osseous lesion. Sinuses/Orbits: Bilateral cataract extraction. Moderate bilateral ethmoid air cell opacification. Milder bilateral frontal sinus mucosal thickening. Clear mastoid air cells. Other: None. CT CERVICAL SPINE FINDINGS Alignment: Reversal of the normal cervical lordosis. 2 mm anterolisthesis of C7 on T1, likely degenerative given facet arthrosis. Skull base and vertebrae: No evidence of acute fracture or destructive  osseous lesion. Soft tissues and spinal canal: No prevertebral fluid or swelling. No visible canal hematoma. Disc levels: Advanced disc degeneration throughout the cervical spine. Interbody ankylosis at C2-3 and C3-4. Facet ankylosis bilaterally at C2-3 and on the right at C3-4. Degenerative endplate sclerosis and spurring from C4-C7. Asymmetric moderate left facet arthrosis at C7-T1. Upper chest: Partially visualized pleural thickening/ scarring in the right greater than left lung apices. Other: Calcified atherosclerosis at the carotid bifurcations. IMPRESSION: 1. No evidence of acute intracranial abnormality. 2. Cerebral atrophy and mild chronic small vessel ischemic disease. 3. No evidence of acute fracture or traumatic subluxation in the cervical spine. Advanced diffuse disc degeneration. Electronically Signed   By: Logan Bores M.D.   On: 06/16/2017 08:55   Ct Cervical Spine Wo Contrast  Result Date: 06/16/2017 CLINICAL DATA:  Unwitnessed fall.  Initial encounter. EXAM: CT HEAD WITHOUT CONTRAST CT CERVICAL SPINE WITHOUT CONTRAST TECHNIQUE: Multidetector CT imaging of the head and cervical spine was performed following the standard protocol without intravenous contrast. Multiplanar CT image reconstructions of the cervical spine were also generated. COMPARISON:  Head CT 02/06/2015 FINDINGS: CT HEAD FINDINGS Brain: Moderate generalized cerebral atrophy is unchanged. Periventricular white matter hypodensities are stable to minimally increased in are nonspecific but compatible with chronic small vessel ischemic disease, mild for age. There is no evidence of acute infarct, intracranial hemorrhage, mass, midline shift, or extra-axial fluid collection. Vascular: Calcified atherosclerosis at the skullbase. No hyperdense vessel. Skull: No fracture or focal osseous lesion. Sinuses/Orbits: Bilateral cataract extraction. Moderate bilateral ethmoid air cell opacification. Milder bilateral frontal sinus mucosal  thickening. Clear mastoid air cells. Other: None. CT CERVICAL SPINE FINDINGS Alignment: Reversal of the normal cervical lordosis. 2 mm anterolisthesis of C7 on T1, likely degenerative given facet arthrosis. Skull base and vertebrae: No evidence of acute fracture or destructive osseous lesion. Soft tissues and spinal  canal: No prevertebral fluid or swelling. No visible canal hematoma. Disc levels: Advanced disc degeneration throughout the cervical spine. Interbody ankylosis at C2-3 and C3-4. Facet ankylosis bilaterally at C2-3 and on the right at C3-4. Degenerative endplate sclerosis and spurring from C4-C7. Asymmetric moderate left facet arthrosis at C7-T1. Upper chest: Partially visualized pleural thickening/ scarring in the right greater than left lung apices. Other: Calcified atherosclerosis at the carotid bifurcations. IMPRESSION: 1. No evidence of acute intracranial abnormality. 2. Cerebral atrophy and mild chronic small vessel ischemic disease. 3. No evidence of acute fracture or traumatic subluxation in the cervical spine. Advanced diffuse disc degeneration. Electronically Signed   By: Logan Bores M.D.   On: 06/16/2017 08:55   Dg Hip Unilat With Pelvis 2-3 Views Left  Result Date: 06/16/2017 CLINICAL DATA:  Status post fall, with left hip pain. Initial encounter. EXAM: DG HIP (WITH OR WITHOUT PELVIS) 2-3V LEFT COMPARISON:  None. FINDINGS: There is a mildly displaced left femoral intertrochanteric fracture. The left femoral head remains seated at the acetabulum. The right hip joint is unremarkable in appearance. Mild degenerative change is noted at the lower lumbar spine. The sacroiliac joints are grossly unremarkable. The visualized bowel gas pattern is unremarkable in appearance. Scattered vascular calcifications are seen. IMPRESSION: 1. Mildly displaced left femoral intertrochanteric fracture. 2. Scattered vascular calcifications seen. Electronically Signed   By: Garald Balding M.D.   On: 06/16/2017  07:05    EKG: Pending at time of admission  Assessment/Plan Principal Problem:   Hip fracture (HCC) Active Problems:   GERD (gastroesophageal reflux disease)   BPH (benign prostatic hyperplasia)   Hyperlipemia   Hypertension   Dementia, vascular   Anemia   Bradycardia   Chronic pulmonary aspiration   #1.  Left hip fracture secondary to fall at facility.  It was unwitnessed presumed to be mechanical.  X-ray reveals mildly displaced left femoral intertrochanteric fracture.  Hx htn, hyperlipidemia, tia. Await orthopedics evaluation and recommendation.  Chest x-ray as noted above.  He is provided with pain medicine in the emergency department. Given age, medical hx patient at least moderate risk.  -Admit to telemetry -EKG -PT/INR -N.p.o. until evaluated by orthopedics -Supportive therapy  #2.  Bradycardia.  Heart rate low end of normal.  EKG pending.  Chart review indicates no history of same -Monitor on telemetry -Obtain a TSH -Resume home meds as indicated  3.  Hypertension.  Fair control in the emergency department.  Home medications include amlodipine -Will resume home medications taking p.o. meds -PRN hydralazine in the meantime  #4.  Anemia.  Normocytic. hemoglobin 10.8 on admission.  History of iron deficiency anemia -Anemia panel -Daughter  #5. Chronic aspiration.  Review indicates evaluated by speech therapy last year placed on dysphagia diet.  Chest x-ray as noted above.  Nguyen saturation level greater than 90% on room air upon admission -N.p.o. for now -Incentive spirometry -Dysphasia  diet when diet resumes  #6. Dementia.  Appears to be stable at baseline  7.  BPH.  Stable. -Continue home meds when appropriate  #8.  GERD. -PPI   DVT prophylaxis: scd Code Status: dnr  Family Communication: none present  Disposition Plan: back tofacility  Consults called: dr Ninfa Linden ortho  Admission status: inpatient    Radene Gunning MD [facility]  If  7PM-7AM, please contact night-coverage [email or web address] Password [email or web address]  06/16/2017, 9:42 AM

## 2017-06-16 NOTE — ED Notes (Signed)
Report given to 5W RN

## 2017-06-16 NOTE — Op Note (Signed)
NAMESHARMARKE, CICIO                ACCOUNT NO.:  [id number]  MEDICAL RECORD NO.:  [id number]  LOCATION:  5W25C                        FACILITY:  [facility]  PHYSICIAN:  Lind Guest. Ninfa Linden, M.D.DATE OF BIRTH:  Jan 21, 1921  DATE OF PROCEDURE:  06/16/2017 DATE OF DISCHARGE:                              OPERATIVE REPORT   PREOPERATIVE DIAGNOSIS:  Left hip intertrochanteric proximal femur fracture.  POSTOPERATIVE DIAGNOSIS:  Left hip intertrochanteric proximal femur fracture.  PROCEDURE:  Open reduction and internal fixation of left intertrochanteric proximal femur fracture using intramedullary rod and hip screw construct.  IMPLANTS:  [employer]omet Affixus trochanteric nail measuring 11 x 380 with a 105-mm lag screw.  SURGEON:  Lind Guest. Ninfa Linden, MD.  ASSISTANT:  Erskine Emery, PA-C.  ANESTHESIA:  General.  ANTIBIOTICS:  2 g of IV Ancef.  BLOOD LOSS:  100 mL.  COMPLICATIONS:  None.  INDICATIONS:  [name] is an 83 year old very active nursing home resident who sustained a mechanical fall earlier this morning.  He was seen at the [facility] and found to have a minimally displaced intertrochanteric hip fracture of the left hip.  He is having significant amount of pain and he is a regular ambulator.  He was admitted to the Medicine Service and I talked to him as well as his family given the light he has mild dementia.  Given his high level functioning and activity, they did wish to proceed with surgery.  He was cleared to proceed with surgery understanding the risk of acute blood loss anemia, nerve and vessel injury, infection, DVT, and PE.  They understand our goals are to decrease pain, improve mobility, and overall improved quality of life.  They understand there was also any perioperative risk given his age.  PROCEDURE DESCRIPTION:  After informed consent was obtained, appropriate left hip was marked.  He was brought to the operating room.   General anesthesia was obtained while he was on the stretcher.  A Foley catheter was placed and he was placed supine on the fracture table with his left leg in in-line skeletal traction with traction applied and internal rotation and perineal post in place with appropriate padding and then his right hip flexed and abducted out of the field.  We then assessed the fracture radiographically and we were able to reduce the fracture near anatomic.  We then chose our rod before prepping and keeping it sterile box looking at the length down the canal and placement for diameter, we chose easily 11 x 380 femoral nail and this was for left femur and was passed off to the back table sterile.  We then prepped the left hip with DuraPrep and sterile drapes.  Time-out was called and he was identified as correct patient and correct left hip.  I then made an incision just proximal to the greater trochanter laterally and carried this down to the tip of the greater trochanter.  A guide pin was then placed temporarily under direct fluoroscopy from the tip of the greater trochanter to past the lesser trochanter and we verified and then placed on AP and lateral planes.  We then used an initiating reamer to open up the femoral canal  and remove the guide pin.  We then easily placed a long femoral nail without needing to ream down the femoral canal.  Using the outrigger guide, we made a separate lateral incision and we were able to place temporary guide pin from the lateral cortex of the femur traversing the fracture going into a center-center position in the femoral head and neck.  I then took a measurement off this and we drilled for a 105-mm lag screw.  Once we got the lag screw closed in place, we let traction off the bed and we were able to compress the fracture.  We then locked the rod from the top.  We did not need to place distal interlock due to the stable nature of this fracture.  All instrumentation was  removed and I was able to use the put the hip through internal and external rotation verifying placement of the hardware under direct fluoroscopy.  We then closed the deep wounds with 0 Vicryl followed by 2-0 Vicryl on the subcutaneous tissue, interrupted staples on the skin.  Xeroform and well-padded sterile dressing were applied.  He was taken off the fracture table and taken to the recovery room in stable condition.  All final counts were correct.  There were no complications noted.  Of note, Erskine Emery, PA-C, assisted in the entire case and his assistance was crucial for facilitating all aspects of this case.     Lind Guest. Ninfa Linden, M.D.     CYB/MEDQ  D:  06/16/2017  T:  06/16/2017  Job:  [id number]

## 2017-06-16 NOTE — Brief Op Note (Signed)
06/16/2017  5:04 PM  PATIENT:  Alexander Goodwin  83 y.o. male  PRE-OPERATIVE DIAGNOSIS:  left hip intertrochanteric fracture  POST-OPERATIVE DIAGNOSIS:  left hip intertrochanteric fracture  PROCEDURE:  Procedure(s): INTRAMEDULLARY (IM) NAIL INTERTROCHANTRIC LEFT HIP (Left)  SURGEON:  Surgeon(s) and Role:    Mcarthur Rossetti, MD - Primary  PHYSICIAN ASSISTANT: Benita Stabile, PA-C  ANESTHESIA:   general  EBL: 200 cc  COUNTS:  YES  DICTATION: .Other Dictation: Dictation Number [id number]  PLAN OF CARE: Admit to inpatient   PATIENT DISPOSITION:  PACU - hemodynamically stable.   Delay start of Pharmacological VTE agent (>24hrs) due to surgical blood loss or risk of bleeding: no

## 2017-06-16 NOTE — Progress Notes (Signed)
Pt admitted to 5W at 1100. Pt mental status is A&O X1. Pt oriented to room, staff, and call bell. Skin is intact besides skin tear on left elbow. Full assessment charted in [facility]. Call bell within reach.

## 2017-06-16 NOTE — Anesthesia Preprocedure Evaluation (Signed)
Anesthesia Evaluation  Patient identified by MRN, date of birth, ID band Patient awake    Reviewed: Allergy & Precautions, NPO status , Patient's Chart, lab work & pertinent test results  Airway Mallampati: II  TM Distance: >3 FB Neck ROM: Full    Dental   Pulmonary former smoker,    Pulmonary exam normal        Cardiovascular hypertension, Normal cardiovascular exam     Neuro/Psych PSYCHIATRIC DISORDERS CVA    GI/Hepatic   Endo/Other    Renal/GU      Musculoskeletal   Abdominal   Peds  Hematology   Anesthesia Other Findings   Reproductive/Obstetrics                             Anesthesia Physical Anesthesia Plan  ASA: III  Anesthesia Plan: General   Post-op Pain Management:    Induction: Intravenous  PONV Risk Score and Plan: 2 and Ondansetron and Treatment may vary due to age or medical condition  Airway Management Planned: Oral ETT  Additional Equipment:   Intra-op Plan:   Post-operative Plan: Extubation in OR  Informed Consent: I have reviewed the patients History and Physical, chart, labs and discussed the procedure including the risks, benefits and alternatives for the proposed anesthesia with the patient or authorized representative who has indicated his/her understanding and acceptance.     Plan Discussed with: CRNA and Surgeon  Anesthesia Plan Comments:         Anesthesia Quick Evaluation

## 2017-06-16 NOTE — Transfer of Care (Signed)
Immediate Anesthesia Transfer of Care Note  Patient: Alexander Goodwin  Procedure(s) Performed: INTRAMEDULLARY (IM) NAIL INTERTROCHANTRIC LEFT HIP (Left Hip)  Patient Location: PACU  Anesthesia Type:General  Level of Consciousness: awake, alert  and patient cooperative  Airway & Oxygen Therapy: Patient Spontanous Breathing and Patient connected to nasal cannula oxygen  Post-op Assessment: Report given to RN and Post -op Vital signs reviewed and stable  Post vital signs: Reviewed  Last Vitals: 155/83, 84, 21, 100% Vitals:   06/16/17 1125 06/16/17 1725  BP: (!) 168/71 (!) 155/83  Pulse: 67 85  Resp: (!) 24 20  Temp:  (!) 36.4 C  SpO2: 95% 100%    Last Pain: There were no vitals filed for this visit.       Complications: No apparent anesthesia complications

## 2017-06-17 ENCOUNTER — Encounter (HOSPITAL_COMMUNITY): Payer: Self-pay | Admitting: Orthopaedic Surgery

## 2017-06-17 LAB — BASIC METABOLIC PANEL
Anion gap: 7 (ref 5–15)
BUN: 12 mg/dL (ref 6–20)
CALCIUM: 9 mg/dL (ref 8.9–10.3)
CO2: 28 mmol/L (ref 22–32)
Chloride: 102 mmol/L (ref 101–111)
Creatinine, Ser: 1.05 mg/dL (ref 0.61–1.24)
GFR calc Af Amer: >60 mL/min (ref >60)
GFR, EST NON AFRICAN AMERICAN: 59 mL/min — AB (ref >60)
GLUCOSE: 124 mg/dL — AB (ref 65–99)
Potassium: 3.7 mmol/L (ref 3.5–5.1)
SODIUM: 137 mmol/L (ref 135–145)

## 2017-06-17 LAB — CBC
HCT: 31.8 % — ABNORMAL LOW (ref 39.0–52.0)
Hemoglobin: 9.9 g/dL — ABNORMAL LOW (ref 13.0–17.0)
MCH: 30.4 pg (ref 26.0–34.0)
MCHC: 31.1 g/dL (ref 30.0–36.0)
MCV: 97.5 fL (ref 78.0–100.0)
PLATELETS: 145 10*3/uL — AB (ref 150–400)
RBC: 3.26 MIL/uL — AB (ref 4.22–5.81)
RDW: 16 % — AB (ref 11.5–15.5)
WBC: 11.4 10*3/uL — AB (ref 4.0–10.5)

## 2017-06-17 LAB — MRSA PCR SCREENING: MRSA by PCR: NEGATIVE

## 2017-06-17 MED ORDER — SUCRALFATE 1 G PO TABS
1.0000 g | ORAL_TABLET | Freq: Three times a day (TID) | ORAL | Status: DC
  Administered 2017-06-17 – 2017-06-19 (×8): 1 g via ORAL
  Filled 2017-06-17 (×8): qty 1

## 2017-06-17 MED ORDER — TAMSULOSIN HCL 0.4 MG PO CAPS
0.4000 mg | ORAL_CAPSULE | Freq: Every day | ORAL | Status: DC
  Administered 2017-06-17 – 2017-06-18 (×2): 0.4 mg via ORAL
  Filled 2017-06-17 (×2): qty 1

## 2017-06-17 NOTE — NC FL2 (Signed)
[place] LEVEL OF CARE SCREENING TOOL     IDENTIFICATION  Patient Name: Alexander Goodwin Birthdate: 1921-01-30 Sex: male Admission Date (Current Location): 06/16/2017  [facility] and [place] Number:  Herbalist and Address:  [facility] [place], [place] [address] [place], [place] [address]      Provider Number: [id number]  Attending Physician Name and Address:  Lavina Hamman, MD  Relative Name and Phone Number:       Current Level of Care: Hospital Recommended Level of Care: [place] Prior Approval Number:    Date Approved/Denied:   PASRR Number: [id number]  Discharge Plan: SNF    Current Diagnoses: Patient Active Problem List   Diagnosis Date Noted  . Hip fracture ([place]) 06/16/2017  . Anemia 06/16/2017  . Bradycardia 06/16/2017  . Chronic pulmonary aspiration 06/16/2017  . Displaced intertrochanteric fracture of left femur, init ([place]) 06/16/2017  . Grief 07/19/2016  . Dementia, vascular 06/15/2015  . DNR (do not resuscitate) 09/15/2014  . Branch retinal vein occlusion 09/02/2014  . Cloudy posterior capsule 04/10/2013  . Pseudoaphakia 04/10/2013  . 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 Primary open angle glaucoma 12/06/2011  . Hyperlipemia   . Hypertension   . History of colon polyps   . Arthritis   . GERD (gastroesophageal reflux disease)   . Glaucoma   . History of esophagitis   . History of TIA (transient ischemic attack)   . BPH (benign prostatic hyperplasia)     Orientation RESPIRATION BLADDER Height & Weight     Self, Situation  Normal Incontinent, Indwelling catheter Weight:   Height:     BEHAVIORAL SYMPTOMS/MOOD NEUROLOGICAL BOWEL NUTRITION STATUS      Continent Diet (see DC summary)  AMBULATORY STATUS COMMUNICATION OF NEEDS Skin   Extensive Assist Verbally Surgical wounds (foam dressing- located on hip)                        Personal Care Assistance Level of Assistance  Bathing, Dressing Bathing Assistance: Maximum assistance   Dressing Assistance: Maximum assistance     Functional Limitations Info             SPECIAL CARE FACTORS FREQUENCY  PT (By licensed PT), OT (By licensed OT)     PT Frequency: 5/wk OT Frequency: 5/wk            Contractures      Additional Factors Info  Code Status, Allergies Code Status Info: DNR Allergies Info: Clindamycin/lincomycin, Erythromycin Base           Current Medications (06/17/2017):  This is the current hospital active medication list Current Facility-Administered Medications  Medication Dose Route Frequency Provider Last Rate Last Dose  . acetaminophen (TYLENOL) tablet 650 mg  650 mg Oral Q6H PRN Mcarthur Rossetti, MD       Or  . acetaminophen (TYLENOL) suppository 650 mg  650 mg Rectal Q6H PRN Mcarthur Rossetti, MD      . aspirin EC tablet 325 mg  325 mg Oral Q breakfast Mcarthur Rossetti, MD   325 mg at 06/17/17 0825  . brimonidine (ALPHAGAN) 0.15 % ophthalmic solution 1 drop  1 drop Both Eyes TID Radene Gunning, NP   1 drop at 06/17/17 1548  . brinzolamide (AZOPT) 1 % ophthalmic suspension 1 drop  1 drop Both Eyes TID Black, Karen M,  NP   1 drop at 06/17/17 1548  . docusate sodium (COLACE) capsule 100 mg  100 mg Oral BID Radene Gunning, NP   100 mg at 06/17/17 0932  . doxazosin (CARDURA) tablet 4 mg  4 mg Oral QHS Radene Gunning, NP   4 mg at 06/16/17 2243  . HYDROcodone-acetaminophen (NORCO/VICODIN) 5-325 MG per tablet 1-2 tablet  1-2 tablet Oral Q6H PRN Mcarthur Rossetti, MD   1 tablet at 06/17/17 1034  . latanoprost (XALATAN) 0.005 % ophthalmic solution 1 drop  1 drop Both Eyes QHS Radene Gunning, NP   1 drop at 06/16/17 2249  . menthol-cetylpyridinium (CEPACOL) lozenge 3 mg  1 lozenge Oral PRN Mcarthur Rossetti, MD       Or  . phenol (CHLORASEPTIC) mouth spray 1 spray  1 spray Mouth/Throat PRN  Mcarthur Rossetti, MD      . methocarbamol (ROBAXIN) tablet 500 mg  500 mg Oral Q6H PRN Radene Gunning, NP       Or  . methocarbamol (ROBAXIN) 500 mg in dextrose 5 % 50 mL IVPB  500 mg Intravenous Q6H PRN Black, Lezlie Octave, NP      . methocarbamol (ROBAXIN) tablet 500 mg  500 mg Oral Q6H PRN Mcarthur Rossetti, MD       Or  . methocarbamol (ROBAXIN) 500 mg in dextrose 5 % 50 mL IVPB  500 mg Intravenous Q6H PRN Mcarthur Rossetti, MD      . metoCLOPramide (REGLAN) tablet 5-10 mg  5-10 mg Oral Q8H PRN Mcarthur Rossetti, MD       Or  . metoCLOPramide (REGLAN) injection 5-10 mg  5-10 mg Intravenous Q8H PRN Mcarthur Rossetti, MD      . morphine 2 MG/ML injection 0.5 mg  0.5 mg Intravenous Q2H PRN Mcarthur Rossetti, MD      . ondansetron [facility]) tablet 4 mg  4 mg Oral Q6H PRN Mcarthur Rossetti, MD       Or  . ondansetron [facility]) injection 4 mg  4 mg Intravenous Q6H PRN Mcarthur Rossetti, MD         Discharge Medications: Please see discharge summary for a list of discharge medications.  Relevant Imaging Results:  Relevant Lab Results:   Additional Information SS#: [id number]  Jorge Ny, LCSW

## 2017-06-17 NOTE — Clinical Social Work Note (Signed)
Clinical Social Work Assessment  Patient Details  Name: Alexander Goodwin MRN: [id number] Date of Birth: [birth year]  Date of referral:  06/17/17               Reason for consult:  Facility Placement                Permission sought to share information with:  Facility Art therapist granted to share information::  Yes, Verbal Permission Granted  Name::     Corporate treasurer::     Relationship::  son  Contact Information:     Housing/Transportation Living arrangements for the past 2 months:  [place] of Information:  Adult Children Patient Interpreter Needed:  None Criminal Activity/Legal Involvement Pertinent to Current Situation/Hospitalization:  No - Comment as needed Significant Relationships:  Adult Children Lives with:  Facility Resident Do you feel safe going back to the place where you live?  No Need for family participation in patient care:  Yes (Comment) (decision making)  Care giving concerns:  Pt lives at [facility]- currently much weaker following fall and needing higher level of care than can be provided at ALF.   Social Worker assessment / plan:  CSW spoke with pt and pt son at bedside concerning PT recommendation for SNF.  Pt alert but did not participate in conversation.  Pt son already has researched SNFs- pt has been in the past.  Employment status:  Retired Forensic scientist:  Medicare PT Recommendations:  [place] / Referral to community resources:  [place]  Patient/Family's Response to care:  Pt son/HCPOA, Alexander Goodwin is agreeable to SNF and hopeful for placement at preferred facility.  Patient/Family's Understanding of and Emotional Response to Diagnosis, Current Treatment, and Prognosis:  No questions or concerns- realistic expectations for rehab and hopeful for improvement with mobility.  Emotional Assessment Appearance:  Appears stated age Attitude/Demeanor/Rapport:    Affect  (typically observed):  Quiet Orientation:  Oriented to Self, Oriented to Situation Alcohol / Substance use:  Not Applicable Psych involvement (Current and /or in the community):  No (Comment)  Discharge Needs  Concerns to be addressed:  Care Coordination Readmission within the last 30 days:  No Current discharge risk:  Physical Impairment Barriers to Discharge:  Continued Medical Work up   Jorge Ny, LCSW 06/17/2017, 3:55 PM

## 2017-06-17 NOTE — Progress Notes (Signed)
Initial Nutrition Assessment  DOCUMENTATION CODES:   Not applicable  INTERVENTION:  1. Monitor for needs with diet advancement  NUTRITION DIAGNOSIS:   Increased nutrient needs related to hip fracture, wound healing as evidenced by estimated needs  GOAL:   Patient will meet greater than or equal to 90% of their needs  MONITOR:   PO intake, I & O's, Labs, Diet advancement, Weight trends  REASON FOR ASSESSMENT:   Consult Assessment of nutrition requirement/status  ASSESSMENT:   Mr. Hawes has a PMH of GERD, TIAs, HTN, vascular dementia, HLD, dysphagia, right upper lobe lung abscess/chronic aspiration, presents with fall, L Hip Fx now s/p intramedullary nail intertrochantric left hip.   Spoke with patient, patient's son at bedside. They report patient had good PO intake PTA. Son lives in [place], [place] unsure of patient's exact intake at facility but states that he is in the dining area "faithfully." Patient states he eats well, 3 times per day, but could not give details of diet hx. Denies any recent weight loss, reports usual weight of 150-160 pounds. No recent weights in chart. NFPE is WNL, patient likely meeting needs. May need ONS depending on PO intake when diet advanced. Has a history of chronic aspiration but son states he does not normally consume a modified diet or thickened liquids.  Labs reviewed  Medications reviewed and include:  Colace  819mL UOP last 24 hrs   NUTRITION - FOCUSED PHYSICAL EXAM:    Most Recent Value  Orbital Region  No depletion  Upper Arm Region  No depletion  Thoracic and Lumbar Region  No depletion  Buccal Region  No depletion  Temple Region  No depletion  Clavicle Bone Region  No depletion  Clavicle and Acromion Bone Region  No depletion  Scapular Bone Region  No depletion  Dorsal Hand  No depletion  Patellar Region  No depletion  Anterior Thigh Region  No depletion  Posterior Calf Region  No depletion  Edema (RD Assessment)  None   Hair  Reviewed  Eyes  Reviewed  Mouth  Reviewed  Skin  Reviewed  Nails  Reviewed       Diet Order:  Diet clear liquid Room service appropriate? Yes; Fluid consistency: Thin  EDUCATION NEEDS:   No education needs have been identified at this time  Skin:  Skin Assessment: Reviewed RN Assessment  Last BM:  06/16/2017 (Type 4)  Height:   Ht Readings from Last 1 Encounters:  02/25/16 5\' 5"  (1.651 m)    Weight:   Wt Readings from Last 1 Encounters:  07/19/16 170 lb 6 oz (77.3 kg)  (No recent weights, patient states UBW Of 150 - 160 pounds)  Ideal Body Weight:  61.81 kg  BMI:  25.04 - 26.71 depending on weight (utilizied stated UBW of 150-160 pounds)  Estimated Nutritional Needs:   Kcal:  1550-1850 calories (IBW x25-30)  Protein:  74-93 grams (1.2-1.5g/kg IBW)  Fluid:  1.5-1.8L   Satira Anis. Prentiss Polio, MS, RD LDN Inpatient Clinical Dietitian Pager [phone]

## 2017-06-17 NOTE — Evaluation (Signed)
Physical Therapy Evaluation Patient Details Name: Alexander Goodwin MRN: [id number] DOB: 1921-06-28 Today's Date: 06/17/2017   History of Present Illness  Alexander Goodwin is a very pleasant 83 y.o. male with medical history significant for hypertension, hyperlipidemia, vascular dementia, right upper lobe lung abscess/chronic aspiration, dysphagia, BPH, GERD, glaucoma resents to the emergency department from a facility with chief complaint of fall and left hip pain.  Imagine showing L femur fx, pt s/p L IM nailing.  Clinical Impression  Pt admitted with/for fall, s/p IM nailing for L femur fx.  Pt needing mod to 2 person mod for basic mobility.  Pt currently limited functionally due to the problems listed. ( See problems list.)   Pt will benefit from PT to maximize function and safety in order to get ready for next venue listed below.     Follow Up Recommendations SNF;Supervision/Assistance - 24 hour    Equipment Recommendations  Other (comment) (TBA)    Recommendations for Other Services       Precautions / Restrictions Precautions Precautions: Fall Restrictions Weight Bearing Restrictions: Yes LLE Weight Bearing: Weight bearing as tolerated      Mobility  Bed Mobility Overal bed mobility: Needs Assistance Bed Mobility: Supine to Sit     Supine to sit: Mod assist;+2 for physical assistance;+2 for safety/equipment     General bed mobility comments: Assisted bridge to EOB, assist to help pt bring trunk forward and for stability until reaching EOB  Transfers Overall transfer level: Needs assistance Equipment used: Rolling walker (2 wheeled);2 person hand held assist Transfers: Sit to/from [employer] Transfers Sit to Stand: Mod assist;+2 physical assistance Stand pivot transfers: Mod assist;+2 physical assistance Squat pivot transfers: Mod assist;+2 physical assistance;+2 safety/equipment     General transfer comment: cues for hand placement and  transfer setup.  Assist for support and help to come forward and up.  Ambulation/Gait             General Gait Details: pivotal steps only.  Antalgic on L LE  Stairs            Wheelchair Mobility    Modified Rankin (Stroke Patients Only)       Balance Overall balance assessment: Needs assistance   Sitting balance-Leahy Scale: Fair Sitting balance - Comments: tends to left posteriorly with any challenge                                     Pertinent Vitals/Pain Pain Assessment: Faces Faces Pain Scale: Hurts even more Pain Location: L hip Pain Descriptors / Indicators: Sore Pain Intervention(s): RN gave pain meds during session;Monitored during session    Home Living Family/patient expects to be discharged to:: Skilled nursing facility                 Additional Comments: pt doing about 50% of ADL's at [facility] facility.  Ambulating with RW.    Prior Function Level of Independence: Needs assistance   Gait / Transfers Assistance Needed: using RW  ADL's / Homemaking Assistance Needed: 50% of ADL's        Hand Dominance        Extremity/Trunk Assessment   Upper Extremity Assessment Upper Extremity Assessment: Defer to OT evaluation (With cuing, pt used UE's functionally to transfer and scoot)    Lower Extremity Assessment Lower Extremity Assessment: Overall WFL for tasks assessed;LLE deficits/detail LLE Deficits / Details: generally  weak, but functional, as expect painful with movement.  AAROM to 100* hip flexion LLE: Unable to fully assess due to pain       Communication   Communication: No difficulties  Cognition Arousal/Alertness: Awake/alert Behavior During Therapy: WFL for tasks assessed/performed Overall Cognitive Status: History of cognitive impairments - at baseline                                        General Comments General comments (skin integrity, edema, etc.): pt became relatively less  responsive when standing during pivot.  Sitting BP 103/60s, pt nauseous x2 and vomited x1, pt had a bowel movement and finally moderated.    Exercises     Assessment/Plan    PT Assessment Patient needs continued PT services  PT Problem List Decreased strength;Decreased activity tolerance;Decreased balance;Decreased mobility;Decreased knowledge of use of DME;Decreased knowledge of precautions;Pain       PT Treatment Interventions DME instruction;Gait training;Functional mobility training;Therapeutic activities;Therapeutic exercise;Balance training;Patient/family education    PT Goals (Current goals can be found in the Care Plan section)  Acute Rehab PT Goals Patient Stated Goal: feel better, be back to by usual level PT Goal Formulation: With patient Time For Goal Achievement: 06/24/17 Potential to Achieve Goals: Good    Frequency Min 3X/week   Barriers to discharge        Co-evaluation PT/OT/SLP Co-Evaluation/Treatment: Yes Reason for Co-Treatment: For patient/therapist safety PT goals addressed during session: Mobility/safety with mobility         AM-PAC PT "6 Clicks" Daily Activity  Outcome Measure Difficulty turning over in bed (including adjusting bedclothes, sheets and blankets)?: Unable Difficulty moving from lying on back to sitting on the side of the bed? : Unable Difficulty sitting down on and standing up from a chair with arms (e.g., wheelchair, bedside commode, etc,.)?: Unable Help needed moving to and from a bed to chair (including a wheelchair)?: A Lot Help needed walking in hospital room?: A Lot Help needed climbing 3-5 steps with a railing? : A Lot 6 Click Score: 9    End of Session   Activity Tolerance: Patient tolerated treatment well;Other (comment) (limited by pain, low BP, n/v) Patient left: in chair;with call bell/phone within reach;with chair alarm set;with family/visitor present Nurse Communication: Mobility status;Other (comment) (STEDY would  be helpful for transfers) PT Visit Diagnosis: Other abnormalities of gait and mobility (R26.89);Muscle weakness (generalized) (M62.81);Pain Pain - Right/Left: Left Pain - part of body: Leg    Time: 6712-4580 PT Time Calculation (min) (ACUTE ONLY): 49 min   Charges:   PT Evaluation $PT Eval Moderate Complexity: 1 Mod PT Treatments $Therapeutic Activity: 8-22 mins   PT G Codes:        2017/07/15  Donnella Sham, PT [phone]  (pager)  Tessie Fass Kassy Mcenroe 07/15/17, 11:27 AM

## 2017-06-17 NOTE — Progress Notes (Signed)
OT Evaluation  PTA, pt lived at [facility], ambulated at modified independent level and recently started having someone assist with ADL as needed. Pt will benefit form rehab at SNF to facilitate safe return to ALF. Will follow acutely to address established goals and facilitate DC to next venue of care.    06/17/17 1200  OT Visit Information  Last OT Received On 06/17/17  Assistance Needed +2  PT/OT/SLP Co-Evaluation/Treatment Yes  Reason for Co-Treatment For patient/therapist safety;To address functional/ADL transfers  OT goals addressed during session ADL's and self-care  History of Present Illness Alexander Goodwin is a very pleasant 83 y.o. male with medical history significant for hypertension, hyperlipidemia, vascular dementia, right upper lobe lung abscess/chronic aspiration, dysphagia, BPH, GERD, glaucoma resents to the emergency department from a facility with chief complaint of fall and left hip pain.  Imagine showing L femur fx, pt s/p L IM nailing.  Precautions  Precautions Fall  Restrictions  Weight Bearing Restrictions Yes  LLE Weight Bearing WBAT  Home Living  Family/patient expects to be discharged to: Skilled nursing facility  Additional Comments pt doing about 50% of ADL's at [facility] facility.  Ambulating with RW.  Prior Function  Level of Independence Needs assistance  Gait / Transfers Assistance Needed using RW  ADL's / Homemaking Assistance Needed 50% of ADL's  Communication / Swallowing Assistance Needed [facility]  Communication  Communication No difficulties  Pain Assessment  Pain Assessment Faces  Faces Pain Scale 6  Pain Location L hip  Pain Descriptors / Indicators Sore;Grimacing  Pain Intervention(s) Limited activity within patient's tolerance;RN gave pain meds during session  Cognition  Arousal/Alertness Awake/alert  Behavior During Therapy [facility] for tasks assessed/performed  Overall Cognitive Status History of cognitive impairments - at baseline  Upper Extremity  Assessment  Upper Extremity Assessment Overall WFL for tasks assessed  Lower Extremity Assessment  Lower Extremity Assessment Defer to PT evaluation  LLE Deficits / Details generally weak, but functional, as expect painful with movement.  AAROM to 100* hip flexion  LLE Unable to fully assess due to pain  Cervical / Trunk Assessment  Cervical / Trunk Assessment Kyphotic  ADL  Overall ADL's  Needs assistance/impaired  Eating/Feeding Details (indicate cue type and reason) per chart, pt has history of dysphagia - nsg notified  Grooming Set up;Sitting  Upper Body Bathing Supervision/ safety;Set up;Bed level  Lower Body Bathing Moderate assistance;Sit to/from stand  Upper Body Dressing  Minimal assistance;Sitting  Lower Body Dressing Maximal assistance;Sit to/from Retail buyer Maximal assistance;Stand-pivot;+2 for physical assistance;RW;BSC  Toileting- Clothing Manipulation and Hygiene Maximal assistance  Toileting - Clothing Manipulation Details (indicate cue type and reason) Pt notified therapist that is needed to have a BM  Functional mobility during ADLs Maximal assistance;+2 for physical assistance  Vision- History  Baseline Vision/History Macular Degeneration  Bed Mobility  Overal bed mobility Needs Assistance  Bed Mobility Supine to Sit  Supine to sit Mod assist;+2 for physical assistance;+2 for safety/equipment  General bed mobility comments Assisted bridge to EOB, assist to help pt bring trunk forward and for stability until reaching EOB  Transfers  Overall transfer level Needs assistance  Equipment used Rolling walker (2 wheeled);2 person hand held assist  Transfers Sit to/from Stand;Stand Pivot Transfers;Squat Pivot Transfers  Sit to Stand Mod assist;+2 physical assistance  Stand pivot transfers +2 physical assistance;Max assist  General transfer comment cues for hand placement and transfer setup.  Assist for support and help to come forward and up.Posterior lean  Balance  Overall balance assessment Needs assistance  Sitting balance-Leahy Scale Fair  Sitting balance - Comments tends to left posteriorly with any challenge  Standing balance-Leahy Scale Poor  Standing balance comment dependent on UE support  Exercises  Exercises (warm up hip/knee flexion/ext ROM exercise.)  OT - End of Session  Equipment Utilized During Treatment Gait belt;Rolling walker;Oxygen  Activity Tolerance Patient tolerated treatment well  Patient left in chair;with call bell/phone within reach;with chair alarm set;with family/visitor present  Nurse Communication Mobility status;Need for lift equipment (use stedy for trnafers)  OT Assessment  OT Recommendation/Assessment Patient needs continued OT Services  OT Visit Diagnosis Other abnormalities of gait and mobility (R26.89);Muscle weakness (generalized) (M62.81);History of falling (Z91.81);Pain  Pain - Right/Left Left  Pain - part of body Hip  OT Problem List Decreased strength;Decreased range of motion;Impaired balance (sitting and/or standing);Decreased activity tolerance;Decreased cognition;Decreased safety awareness;Pain  OT Plan  OT Frequency (ACUTE ONLY) Min 2X/week  OT Treatment/Interventions (ACUTE ONLY) Self-care/ADL training;Therapeutic exercise;DME and/or AE instruction;Therapeutic activities;Cognitive remediation/compensation;Patient/family education;Balance training  AM-PAC OT "6 Clicks" Daily Activity Outcome Measure  Help from another person eating meals? 4  Help from another person taking care of personal grooming? 3  Help from another person toileting, which includes using toliet, bedpan, or urinal? 2  Help from another person bathing (including washing, rinsing, drying)? 2  Help from another person to put on and taking off regular upper body clothing? 3  Help from another person to put on and taking off regular lower body clothing? 2  6 Click Score 16  ADL G Code Conversion CK  OT Recommendation   Follow Up Recommendations SNF;Supervision/Assistance - 24 hour  OT Equipment None recommended by OT  Individuals Consulted  Consulted and Agree with Results and Recommendations Patient;Family member/caregiver  Family Member Consulted son  Acute Rehab OT Goals  Patient Stated Goal feel better, be back to by usual level  OT Goal Formulation With patient  Time For Goal Achievement 07/01/17  Potential to Achieve Goals Good  OT Time Calculation  OT Start Time (ACUTE ONLY) 1009  OT Stop Time (ACUTE ONLY) 1058  OT Time Calculation (min) 49 min  OT General Charges  $OT Visit 1 Visit  OT Evaluation  $OT Eval Moderate Complexity 1 Mod  Written Expression  Dominant Hand Right  [facility], OT/L  [phone] 06/17/2017

## 2017-06-17 NOTE — Discharge Instructions (Signed)
Increase activities as comfort allows. Full weight bearing as tolerated left hip. Can get incisions wet daily in the shower. New dry dressing daily as needed left hip incisions.

## 2017-06-17 NOTE — Progress Notes (Signed)
[facility] Progress Note  Patient: Alexander Goodwin UKG:[id number]   PCP: Coral Spikes, DO DOB: [birth year]   DOA: 06/16/2017   DOS: 06/17/2017   Date of Service: the patient was seen and examined on 06/17/2017  Subjective: Feeling better, still has some pain.  No nausea no vomiting.  Brief hospital course: Pt. with PMH of HTN, HLD, dementia, dysphagia, BPH, GERD, glaucoma; admitted on 06/16/2017, presented with complaint of fall, was found to have left hip fracture.  Patient underwent left hip intramedullary rod and hip screw placement. Currently further plan is monitor for postoperative recovery.  Assessment and Plan: 1.  Mechanical fall. Closed left hip fracture. S/P left hip intramedullary rod and hip screw placement. Pain is well controlled. No acute events overnight. Hemoglobin and serum creatinine relatively stable. PT OT recommends SNF. Social worker working on the case. Patient is already a resident at [facility].  #2.  Bradycardia.  No acute events on telemetry. Need to monitor.  3. Hypertension Pressure relatively low, will hold home blood pressure medications for now. Change doxazosin to flomax  #4.  Anemia.  Normocytic. hemoglobin 10.8 on admission.  History of iron deficiency anemia Monitor H&H tomorrow.  #5. Chronic aspiration.   Continue dysphagia   #6. Dementia.  Appears to be stable at baseline  7.  BPH.  Stable. -Continue home meds when appropriate  #8.  GERD.  Diet: dysphagia diet DVT Prophylaxis: subcutaneous Heparin  Advance goals of care discussion: DNR DNI  Family Communication: family was present at bedside, at the time of interview. The pt provided permission to discuss medical plan with the family. Opportunity was given to ask question and all questions were answered satisfactorily.   Disposition:  Discharge to SNF.  Consultants: orthopedics  Procedures: ORIF intramedullary rod placement as well as  screw.  Antibiotics: Anti-infectives    Start     Dose/Rate Route Frequency Ordered Stop   06/16/17 2200  ceFAZolin (ANCEF) IVPB 2g/100 mL premix     2 g 200 mL/hr over 30 Minutes Intravenous Every 6 hours 06/16/17 1857 06/17/17 0438       Objective: Physical Exam: Vitals:   06/17/17 0028 06/17/17 0432 06/17/17 0940 06/17/17 1505  BP: (!) 111/59 (!) 132/47 (!) 105/44 (!) 102/41  Pulse: 79 79  72  Resp: 20 18  18   Temp:  98.4 F (36.9 C)  98 F (36.7 C)  TempSrc:  Oral  Oral  SpO2: (!) 87% 91%  90%    Intake/Output Summary (Last 24 hours) at 06/17/17 1630 Last data filed at 06/17/17 1500  Gross per 24 hour  Intake          1943.33 ml  Output              700 ml  Net          1243.33 ml   There were no vitals filed for this visit. General: Alert, Awake and Oriented to Time, Place and Person. Appear in mild distress, affect appropriate Eyes: PERRL, Conjunctiva normal ENT: Oral Mucosa clear moist. Neck: difficult to assess JVD, no Abnormal Mass Or lumps Cardiovascular: S1 and S2 Present, aortic systolic Murmur, Peripheral Pulses Present Respiratory: normal respiratory effort, Bilateral Air entry equal and Decreased, no use of accessory muscle, basal Crackles, no wheezes Abdomen: Bowel Sound present, Soft and no tenderness, no hernia Skin: no redness, no Rash, no induration Extremities: no Pedal edema, no calf tenderness Neurologic: Grossly no focal neuro deficit. Bilaterally Equal motor strength  Data  Reviewed: CBC:  Recent Labs Lab 06/16/17 0704 06/17/17 0634  WBC 8.3 11.4*  NEUTROABS 6.0  --   HGB 10.9* 9.9*  HCT 34.2* 31.8*  MCV 96.6 97.5  PLT 156 102*   Basic Metabolic Panel:  Recent Labs Lab 06/16/17 0704 06/17/17 0634  NA 138 137  K 3.5 3.7  CL 103 102  CO2 28 28  GLUCOSE 115* 124*  BUN 12 12  CREATININE 1.12 1.05  CALCIUM 9.3 9.0    Liver Function Tests:  Recent Labs Lab 06/16/17 0704  AST 18  ALT 18  ALKPHOS 95  BILITOT 0.8  PROT  6.5  ALBUMIN 3.3*   No results for input(s): LIPASE, AMYLASE in the last 168 hours. No results for input(s): AMMONIA in the last 168 hours. Coagulation Profile: No results for input(s): INR, PROTIME in the last 168 hours. Cardiac Enzymes:  Recent Labs Lab 06/16/17 0926  CKTOTAL 73   BNP (last 3 results) No results for input(s): PROBNP in the last 8760 hours. CBG: No results for input(s): GLUCAP in the last 168 hours. Studies: Dg C-arm 1-60 Min  Result Date: 06/16/2017 CLINICAL DATA:  Left intramedullary nail fixation EXAM: DG C-ARM 61-120 MIN; LEFT FEMUR 2 VIEWS COMPARISON:  Radiograph from earlier today FINDINGS: Intramedullary nail fixation of the left femur with dynamic hip screw. No evidence of intraprocedural fracture. The left hip is located. IMPRESSION: Fluoroscopy for left intertrochanteric femur fracture fixation. Electronically Signed   By: Monte Fantasia M.D.   On: 06/16/2017 18:18   Dg Femur Min 2 Views Left  Result Date: 06/16/2017 CLINICAL DATA:  Left intramedullary nail fixation EXAM: DG C-ARM 61-120 MIN; LEFT FEMUR 2 VIEWS COMPARISON:  Radiograph from earlier today FINDINGS: Intramedullary nail fixation of the left femur with dynamic hip screw. No evidence of intraprocedural fracture. The left hip is located. IMPRESSION: Fluoroscopy for left intertrochanteric femur fracture fixation. Electronically Signed   By: Monte Fantasia M.D.   On: 06/16/2017 18:18    Scheduled Meds: . aspirin EC  325 mg Oral Q breakfast  . brimonidine  1 drop Both Eyes TID  . brinzolamide  1 drop Both Eyes TID  . docusate sodium  100 mg Oral BID  . latanoprost  1 drop Both Eyes QHS  . sucralfate  1 g Oral TID WC & HS  . tamsulosin  0.4 mg Oral QPC supper   Continuous Infusions: . methocarbamol (ROBAXIN)  IV    . methocarbamol (ROBAXIN)  IV     PRN Meds: acetaminophen **OR** acetaminophen, HYDROcodone-acetaminophen, menthol-cetylpyridinium **OR** phenol, methocarbamol **OR**  methocarbamol (ROBAXIN)  IV, methocarbamol **OR** methocarbamol (ROBAXIN)  IV, metoCLOPramide **OR** metoCLOPramide (REGLAN) injection, morphine injection, ondansetron **OR** ondansetron (ZOFRAN) IV  Time spent: 35 minutes  Author: Berle Mull, MD [facility] Pager: [phone] 06/17/2017 4:30 PM  If 7PM-7AM, please contact night-coverage at [email or web address], password [facility]

## 2017-06-17 NOTE — Progress Notes (Signed)
CM received consult:  Equipment     Home health needs    Medication needs    Other (see comments)       Comments   PT / OT / SLP DME as needed  PT/OT consults pending, CM to f/u with disposition needs. Whitman Hero RN,BSN,CM

## 2017-06-17 NOTE — Progress Notes (Signed)
Subjective: 1 Day Post-Op Procedure(s) (LRB): INTRAMEDULLARY (IM) NAIL INTERTROCHANTRIC LEFT HIP (Left) No acute changes overnight.  Tolerated the surgery on his left hip well.  His son is at the bedside and stayed with him all night.  Labs not back this am yet.  Objective: Vital signs in last 24 hours: Temp:  [97.5 F (36.4 C)-98.4 F (36.9 C)] 98.4 F (36.9 C) (10/29 0432) Pulse Rate:  [51-85] 79 (10/29 0432) Resp:  [8-24] 18 (10/29 0432) BP: (111-171)/(47-102) 132/47 (10/29 0432) SpO2:  [87 %-100 %] 91 % (10/29 0432)  Intake/Output from previous day: 10/28 0701 - 10/29 0700 In: 1971.7 [I.V.:1971.7] Out: 900 [Urine:800; Blood:100] Intake/Output this shift: No intake/output data recorded.   Recent Labs  06/16/17 0704  HGB 10.9*    Recent Labs  06/16/17 0704 06/16/17 1030  WBC 8.3  --   RBC 3.54* 3.52*  HCT 34.2*  --   PLT 156  --     Recent Labs  06/16/17 0704  NA 138  K 3.5  CL 103  CO2 28  BUN 12  CREATININE 1.12  GLUCOSE 115*  CALCIUM 9.3   No results for input(s): LABPT, INR in the last 72 hours.  Sensation intact distally Intact pulses distally Dorsiflexion/Plantar flexion intact Incision: dressing C/D/I  Assessment/Plan: 1 Day Post-Op Procedure(s) (LRB): INTRAMEDULLARY (IM) NAIL INTERTROCHANTRIC LEFT HIP (Left) Up with therapy - can put full weight as tolerated on left hip PT/OT Social Work to be involved with discharge planning.  Mcarthur Rossetti 06/17/2017, 7:10 AM

## 2017-06-17 NOTE — Progress Notes (Signed)
[facility] Progress Note  Patient: Alexander Goodwin [id number]:[id number]   PCP: Coral Spikes, DO DOB: Nov 19, 1920   DOA: 06/16/2017   DOS: 06/17/2017   Date of Service: the patient was seen and examined on 06/17/2017  Subjective: Feeling better, still has some pain.  No nausea no vomiting.  Brief hospital course: Pt. with PMH of HTN, HLD, dementia, dysphagia, BPH, GERD, glaucoma; admitted on 06/16/2017, presented with complaint of fall, was found to have left hip fracture.  Patient underwent left hip intramedullary rod and hip screw placement. Currently further plan is monitor for postoperative recovery.  Assessment and Plan: 1.  Mechanical fall. Closed left hip fracture. S/P left hip intramedullary rod and hip screw placement. Pain is well controlled. No acute events overnight. Hemoglobin and serum creatinine relatively stable. PT OT recommends SNF. Social worker working on the case. Patient is already a resident at [facility].  #2.  Bradycardia.  No acute events on telemetry. Need to monitor.  3. Hypertension Pressure relatively low, will hold home blood pressure medications for now. Continuing doxazosin as it is also helpful and BPH.  #4.  Anemia.  Normocytic. hemoglobin 10.8 on admission.  History of iron deficiency anemia Monitor H&H tomorrow.  #5. Chronic aspiration.   Continue dysphagia   #6. Dementia.  Appears to be stable at baseline  7.  BPH.  Stable. -Continue home meds when appropriate  #8.  GERD.  Diet: dysphagia diet DVT Prophylaxis: subcutaneous Heparin  Advance goals of care discussion: DNR DNI  Family Communication: family was present at bedside, at the time of interview. The pt provided permission to discuss medical plan with the family. Opportunity was given to ask question and all questions were answered satisfactorily.   Disposition:  Discharge to SNF.  Consultants: orthopedics  Procedures: ORIF intramedullary rod placement as well as  screw.  Antibiotics: Anti-infectives    Start     Dose/Rate Route Frequency Ordered Stop   06/16/17 2200  ceFAZolin (ANCEF) IVPB 2g/100 mL premix     2 g 200 mL/hr over 30 Minutes Intravenous Every 6 hours 06/16/17 1857 06/17/17 0438       Objective: Physical Exam: Vitals:   06/17/17 0028 06/17/17 0432 06/17/17 0940 06/17/17 1505  BP: (!) 111/59 (!) 132/47 (!) 105/44 (!) 102/41  Pulse: 79 79  72  Resp: 20 18  18   Temp:  98.4 F (36.9 C)  98 F (36.7 C)  TempSrc:  Oral  Oral  SpO2: (!) 87% 91%  90%    Intake/Output Summary (Last 24 hours) at 06/17/17 1521 Last data filed at 06/17/17 1500  Gross per 24 hour  Intake          1943.33 ml  Output              700 ml  Net          1243.33 ml   There were no vitals filed for this visit. General: Alert, Awake and Oriented to Time, Place and Person. Appear in mild distress, affect appropriate Eyes: PERRL, Conjunctiva normal ENT: Oral Mucosa clear moist. Neck: difficult to assess JVD, no Abnormal Mass Or lumps Cardiovascular: S1 and S2 Present, aortic systolic Murmur, Peripheral Pulses Present Respiratory: normal respiratory effort, Bilateral Air entry equal and Decreased, no use of accessory muscle, basal Crackles, no wheezes Abdomen: Bowel Sound present, Soft and no tenderness, no hernia Skin: no redness, no Rash, no induration Extremities: no Pedal edema, no calf tenderness Neurologic: Grossly no focal neuro deficit. Bilaterally  Equal motor strength  Data Reviewed: CBC:  Recent Labs Lab 06/16/17 0704 06/17/17 0634  WBC 8.3 11.4*  NEUTROABS 6.0  --   HGB 10.9* 9.9*  HCT 34.2* 31.8*  MCV 96.6 97.5  PLT 156 448*   Basic Metabolic Panel:  Recent Labs Lab 06/16/17 0704 06/17/17 0634  NA 138 137  K 3.5 3.7  CL 103 102  CO2 28 28  GLUCOSE 115* 124*  BUN 12 12  CREATININE 1.12 1.05  CALCIUM 9.3 9.0    Liver Function Tests:  Recent Labs Lab 06/16/17 0704  AST 18  ALT 18  ALKPHOS 95  BILITOT 0.8  PROT  6.5  ALBUMIN 3.3*   No results for input(s): LIPASE, AMYLASE in the last 168 hours. No results for input(s): AMMONIA in the last 168 hours. Coagulation Profile: No results for input(s): INR, PROTIME in the last 168 hours. Cardiac Enzymes:  Recent Labs Lab 06/16/17 0926  CKTOTAL 73   BNP (last 3 results) No results for input(s): PROBNP in the last 8760 hours. CBG: No results for input(s): GLUCAP in the last 168 hours. Studies: Dg C-arm 1-60 Min  Result Date: 06/16/2017 CLINICAL DATA:  Left intramedullary nail fixation EXAM: DG C-ARM 61-120 MIN; LEFT FEMUR 2 VIEWS COMPARISON:  Radiograph from earlier today FINDINGS: Intramedullary nail fixation of the left femur with dynamic hip screw. No evidence of intraprocedural fracture. The left hip is located. IMPRESSION: Fluoroscopy for left intertrochanteric femur fracture fixation. Electronically Signed   By: Monte Fantasia M.D.   On: 06/16/2017 18:18   Dg Femur Min 2 Views Left  Result Date: 06/16/2017 CLINICAL DATA:  Left intramedullary nail fixation EXAM: DG C-ARM 61-120 MIN; LEFT FEMUR 2 VIEWS COMPARISON:  Radiograph from earlier today FINDINGS: Intramedullary nail fixation of the left femur with dynamic hip screw. No evidence of intraprocedural fracture. The left hip is located. IMPRESSION: Fluoroscopy for left intertrochanteric femur fracture fixation. Electronically Signed   By: Monte Fantasia M.D.   On: 06/16/2017 18:18    Scheduled Meds: . aspirin EC  325 mg Oral Q breakfast  . brimonidine  1 drop Both Eyes TID  . brinzolamide  1 drop Both Eyes TID  . docusate sodium  100 mg Oral BID  . doxazosin  4 mg Oral QHS  . latanoprost  1 drop Both Eyes QHS   Continuous Infusions: . methocarbamol (ROBAXIN)  IV    . methocarbamol (ROBAXIN)  IV     PRN Meds: acetaminophen **OR** acetaminophen, HYDROcodone-acetaminophen, menthol-cetylpyridinium **OR** phenol, methocarbamol **OR** methocarbamol (ROBAXIN)  IV, methocarbamol **OR**  methocarbamol (ROBAXIN)  IV, metoCLOPramide **OR** metoCLOPramide (REGLAN) injection, morphine injection, ondansetron **OR** ondansetron (ZOFRAN) IV  Time spent: 35 minutes  Author: Berle Mull, MD [facility] Pager: [phone] 06/17/2017 3:21 PM  If 7PM-7AM, please contact night-coverage at [email or web address], password [facility]

## 2017-06-18 ENCOUNTER — Inpatient Hospital Stay (HOSPITAL_COMMUNITY): Payer: Medicare Other

## 2017-06-18 LAB — CBC
HEMATOCRIT: 27.4 % — AB (ref 39.0–52.0)
Hemoglobin: 8.9 g/dL — ABNORMAL LOW (ref 13.0–17.0)
MCH: 31 pg (ref 26.0–34.0)
MCHC: 32.5 g/dL (ref 30.0–36.0)
MCV: 95.5 fL (ref 78.0–100.0)
PLATELETS: 129 10*3/uL — AB (ref 150–400)
RBC: 2.87 MIL/uL — ABNORMAL LOW (ref 4.22–5.81)
RDW: 15.7 % — AB (ref 11.5–15.5)
WBC: 14.9 10*3/uL — AB (ref 4.0–10.5)

## 2017-06-18 LAB — BASIC METABOLIC PANEL
ANION GAP: 9 (ref 5–15)
BUN: 16 mg/dL (ref 6–20)
CALCIUM: 9 mg/dL (ref 8.9–10.3)
CO2: 25 mmol/L (ref 22–32)
Chloride: 102 mmol/L (ref 101–111)
Creatinine, Ser: 1.19 mg/dL (ref 0.61–1.24)
GFR calc Af Amer: 58 mL/min — ABNORMAL LOW (ref >60)
GFR, EST NON AFRICAN AMERICAN: 50 mL/min — AB (ref >60)
GLUCOSE: 115 mg/dL — AB (ref 65–99)
Potassium: 3.4 mmol/L — ABNORMAL LOW (ref 3.5–5.1)
Sodium: 136 mmol/L (ref 135–145)

## 2017-06-18 MED ORDER — MELATONIN 3 MG PO TABS
3.0000 mg | ORAL_TABLET | Freq: Every day | ORAL | Status: DC
  Administered 2017-06-18: 3 mg via ORAL
  Filled 2017-06-18 (×2): qty 1

## 2017-06-18 MED ORDER — MEMANTINE HCL 10 MG PO TABS
5.0000 mg | ORAL_TABLET | Freq: Two times a day (BID) | ORAL | Status: DC
  Administered 2017-06-18 – 2017-06-19 (×3): 5 mg via ORAL
  Filled 2017-06-18 (×3): qty 1

## 2017-06-18 MED ORDER — MORPHINE SULFATE (PF) 2 MG/ML IV SOLN: 2.0000 mg | INTRAVENOUS | Status: DC | PRN

## 2017-06-18 MED ORDER — SERTRALINE HCL 50 MG PO TABS
50.0000 mg | ORAL_TABLET | Freq: Every day | ORAL | Status: DC
  Administered 2017-06-18 – 2017-06-19 (×2): 50 mg via ORAL
  Filled 2017-06-18 (×2): qty 1

## 2017-06-18 MED ORDER — FERROUS GLUCONATE 324 (38 FE) MG PO TABS
324.0000 mg | ORAL_TABLET | Freq: Every day | ORAL | Status: DC
  Administered 2017-06-18 – 2017-06-19 (×2): 324 mg via ORAL
  Filled 2017-06-18 (×2): qty 1

## 2017-06-18 MED ORDER — PANTOPRAZOLE SODIUM 40 MG PO TBEC
40.0000 mg | DELAYED_RELEASE_TABLET | Freq: Every day | ORAL | Status: DC
  Administered 2017-06-18 – 2017-06-19 (×2): 40 mg via ORAL
  Filled 2017-06-18 (×2): qty 1

## 2017-06-18 MED ORDER — QUETIAPINE FUMARATE 25 MG PO TABS
25.0000 mg | ORAL_TABLET | Freq: Two times a day (BID) | ORAL | Status: DC
  Administered 2017-06-18 (×2): 25 mg via ORAL
  Filled 2017-06-18 (×2): qty 1

## 2017-06-18 MED ORDER — HALOPERIDOL LACTATE 5 MG/ML IJ SOLN
2.0000 mg | Freq: Four times a day (QID) | INTRAMUSCULAR | Status: DC | PRN
  Administered 2017-06-18: 2 mg via INTRAVENOUS
  Filled 2017-06-18: qty 1

## 2017-06-18 NOTE — Progress Notes (Signed)
Physical Therapy Treatment Patient Details Name: Alexander Goodwin MRN: [id number] DOB: [birth year] Today's Date: 06/18/2017    History of Present Illness Alexander Goodwin is a very pleasant 83 y.o. male with medical history significant for hypertension, hyperlipidemia, vascular dementia, right upper lobe lung abscess/chronic aspiration, dysphagia, BPH, GERD, glaucoma resents to the emergency department from a facility with chief complaint of fall and left hip pain.  Imagine showing L femur fx, pt s/p L IM nailing.    PT Comments    Pt improving slowly with transitions and basic mobility.  Emphasis on warm up. Bed mobility, scooting, standing/transfers, and gait instability   Follow Up Recommendations  SNF;Supervision/Assistance - 24 hour     Equipment Recommendations  Other (comment)    Recommendations for Other Services       Precautions / Restrictions Precautions Precautions: Fall Restrictions LLE Weight Bearing: Weight bearing as tolerated    Mobility  Bed Mobility Overal bed mobility: Needs Assistance Bed Mobility: Supine to Sit     Supine to sit: Mod assist;+2 for safety/equipment     General bed mobility comments: Assisted bridge to EOB, assist to help pt bring trunk forward and for stability until reaching EOB  Transfers Overall transfer level: Needs assistance   Transfers: Sit to/from Stand Sit to Stand: Mod assist         General transfer comment: cues for hand placement, assist to come forward more than boosting  Ambulation/Gait Ambulation/Gait assistance: Min assist Ambulation Distance (Feet): 8 Feet (then 15 feet with RW) Assistive device: Rolling walker (2 wheeled) Gait Pattern/deviations: Step-to pattern;Step-through pattern;Decreased step length - right;Decreased stance time - left   Gait velocity interpretation: Below normal speed for age/gender General Gait Details: pt's gait mostly antalgic on left with difficulty w/bearing on the left and  advancing on the R.   Stairs            Wheelchair Mobility    Modified Rankin (Stroke Patients Only)       Balance     Sitting balance-Leahy Scale: Fair Sitting balance - Comments: list right off of the L hip.     Standing balance-Leahy Scale: Poor Standing balance comment: dependent on UE support                            Cognition Arousal/Alertness: Awake/alert Behavior During Therapy: WFL for tasks assessed/performed Overall Cognitive Status: History of cognitive impairments - at baseline                                        Exercises Total Joint Exercises Ankle Circles/Pumps: AROM;20 reps;Supine Heel Slides: AROM;Strengthening;10 reps;Supine (graded fross ext resistance) Hip ABduction/ADduction: AAROM;Left;10 reps;Supine Bridges: AAROM;Right;5 reps;Supine    General Comments        Pertinent Vitals/Pain Pain Assessment: 0-10 Pain Score: 10-Worst pain ever Pain Descriptors / Indicators: Sore;Grimacing;Guarding Pain Intervention(s): Monitored during session;Premedicated before session    Home Living                      Prior Function            PT Goals (current goals can now be found in the care plan section) Acute Rehab PT Goals Patient Stated Goal: feel better, be back to by usual level PT Goal Formulation: With patient Time For Goal Achievement: 06/24/17 Potential to  Achieve Goals: Good    Frequency    Min 3X/week      PT Plan Current plan remains appropriate    Co-evaluation              AM-PAC PT "6 Clicks" Daily Activity  Outcome Measure  Difficulty turning over in bed (including adjusting bedclothes, sheets and blankets)?: Unable Difficulty moving from lying on back to sitting on the side of the bed? : Unable Difficulty sitting down on and standing up from a chair with arms (e.g., wheelchair, bedside commode, etc,.)?: Unable Help needed moving to and from a bed to chair  (including a wheelchair)?: A Lot Help needed walking in hospital room?: A Lot Help needed climbing 3-5 steps with a railing? : A Lot 6 Click Score: 9    End of Session   Activity Tolerance: Patient tolerated treatment well;Patient limited by pain Patient left: in chair;with call bell/phone within reach;with chair alarm set Nurse Communication: Mobility status;Other (comment) PT Visit Diagnosis: Other abnormalities of gait and mobility (R26.89);Muscle weakness (generalized) (M62.81);Pain Pain - Right/Left: Left Pain - part of body: Leg     Time: 1124-1150 PT Time Calculation (min) (ACUTE ONLY): 26 min  Charges:  $Gait Training: 8-22 mins $Therapeutic Activity: 8-22 mins                    G Codes:       2017/07/05  Donnella Sham, PT [phone]  (pager)   Tessie Fass Ayzia Day 07-05-2017, 12:32 PM

## 2017-06-18 NOTE — Consult Note (Signed)
[facility] CM Primary Care Navigator  06/18/2017  Alexander Goodwin Aug 02, 1921 [id number]   Met with patientand son Alexander Goodwin) at the bedside to identify possible discharge needs. Son reports that patient had a fall at [facility] facility and complained of left hip pain which resulted to this admission/ surgery.  Patient resides at an assisted living facility ([facility]) for about 10 months after wife passed away per son's report.  Son verbalized that facility physician is patient's current primary care provider.   Patient's son states using facility pharmacy in [place] for his medications.  Son reports that care needs have been provided by facility staff which includesadministering of medications and transportation to his doctors' appointments. Patient's sons Alexander Goodwin and Alexander Goodwin) also provide him transportation at times as needed.   Anticipated discharge plan is skilled nursing facility per therapy recommendation (SNF- in process) prior to return to assisted living facility.  [facility] for health managementat home. Patient's son voiced no current needs or concerns at this point. He expressedunderstanding to seekreferral from primary care provider to [facility] care management ifdeemed necessary and appropriatefor services in the future- once he returns back to assisted living facility.  For additional questions please contact:  Edwena Felty A. Welton Bord, BSN, RN-BC [facility] PRIMARY CARE Navigator Cell: [phone]

## 2017-06-18 NOTE — Progress Notes (Signed)
[facility] Progress Note  Patient: Alexander Goodwin KXF:[id number]   PCP: Coral Spikes, DO DOB: Apr 23, 1921   DOA: 06/16/2017   DOS: 06/18/2017   Date of Service: the patient was seen and examined on 06/18/2017  Subjective: Patient shows some confusion and agitation.  No nausea no vomiting.  Brief hospital course: Pt. with PMH of HTN, HLD, dementia, dysphagia, BPH, GERD, glaucoma; admitted on 06/16/2017, presented with complaint of fall, was found to have left hip fracture.  Patient underwent left hip intramedullary rod and hip screw placement. Currently further plan is monitor for postoperative recovery.  Assessment and Plan: 1.  Mechanical fall. Closed left hip fracture. S/P left hip intramedullary rod and hip screw placement. Pain is well controlled. No acute events overnight. Hemoglobin and serum creatinine relatively stable. PT OT recommends SNF. Social worker working on the case. Patient is already a resident at [facility].  #2.  Bradycardia.  No acute events on telemetry. Need to monitor.  3. Hypertension Pressure relatively low, will hold home blood pressure medications for now. Change doxazosin to flomax  #4.  Anemia.  Normocytic. hemoglobin 10.8 on admission.  History of iron deficiency anemia Monitor H&H tomorrow.  #5. Chronic aspiration.   Continue dysphagia   #6. Dementia.  Appears to be stable at baseline  7.  BPH.  Stable. -Continue home meds when appropriate  #8.  GERD.  9.  Acute encephalopathy. Acute delirium. Delirium and encephalopathy is likely multifactorial, postop delirium as well as poor pain control contributing to it. At present we will start the patient on Seroquel twice daily and monitor. Recommended patient's nurse to adequately control patient's pain as well.  Diet: dysphagia diet DVT Prophylaxis: subcutaneous Heparin  Advance goals of care discussion: DNR DNI  Family Communication: family was present at bedside, at the time of  interview. The pt provided permission to discuss medical plan with the family. Opportunity was given to ask question and all questions were answered satisfactorily.   Disposition:  Discharge to SNF.  Consultants: orthopedics  Procedures: ORIF intramedullary rod placement as well as screw.  Antibiotics: Anti-infectives    Start     Dose/Rate Route Frequency Ordered Stop   06/16/17 2200  ceFAZolin (ANCEF) IVPB 2g/100 mL premix     2 g 200 mL/hr over 30 Minutes Intravenous Every 6 hours 06/16/17 1857 06/17/17 0438       Objective: Physical Exam: Vitals:   06/17/17 2108 06/18/17 0547 06/18/17 1419 06/18/17 1422  BP: (!) 115/44 125/66 (!) 148/56 (!) 137/54  Pulse: 73 79 75 73  Resp: 20 17 18 18   Temp: 98.2 F (36.8 C) 98.4 F (36.9 C) 98.5 F (36.9 C) 98.2 F (36.8 C)  TempSrc: Oral Oral Oral Oral  SpO2: 91% 93% 96% 95%    Intake/Output Summary (Last 24 hours) at 06/18/17 1813 Last data filed at 06/18/17 1400  Gross per 24 hour  Intake              480 ml  Output              350 ml  Net              130 ml   There were no vitals filed for this visit. General: Alert, Awake and Oriented to Time, Place and Person. Appear in mild distress, affect appropriate Eyes: PERRL, Conjunctiva normal ENT: Oral Mucosa clear moist. Neck: difficult to assess JVD, no Abnormal Mass Or lumps Cardiovascular: S1 and S2 Present, aortic systolic  Murmur, Peripheral Pulses Present Respiratory: normal respiratory effort, Bilateral Air entry equal and Decreased, no use of accessory muscle, basal Crackles, no wheezes Abdomen: Bowel Sound present, Soft and no tenderness, no hernia Skin: no redness, no Rash, no induration Extremities: no Pedal edema, no calf tenderness Neurologic: Grossly no focal neuro deficit. Bilaterally Equal motor strength  Data Reviewed: CBC:  Recent Labs Lab 06/16/17 0704 06/17/17 0634 06/18/17 0519  WBC 8.3 11.4* 14.9*  NEUTROABS 6.0  --   --   HGB 10.9* 9.9* 8.9*    HCT 34.2* 31.8* 27.4*  MCV 96.6 97.5 95.5  PLT 156 145* 329*   Basic Metabolic Panel:  Recent Labs Lab 06/16/17 0704 06/17/17 0634 06/18/17 0519  NA 138 137 136  K 3.5 3.7 3.4*  CL 103 102 102  CO2 28 28 25   GLUCOSE 115* 124* 115*  BUN 12 12 16   CREATININE 1.12 1.05 1.19  CALCIUM 9.3 9.0 9.0    Liver Function Tests:  Recent Labs Lab 06/16/17 0704  AST 18  ALT 18  ALKPHOS 95  BILITOT 0.8  PROT 6.5  ALBUMIN 3.3*   No results for input(s): LIPASE, AMYLASE in the last 168 hours. No results for input(s): AMMONIA in the last 168 hours. Coagulation Profile: No results for input(s): INR, PROTIME in the last 168 hours. Cardiac Enzymes:  Recent Labs Lab 06/16/17 0926  CKTOTAL 73   BNP (last 3 results) No results for input(s): PROBNP in the last 8760 hours. CBG: No results for input(s): GLUCAP in the last 168 hours. Studies: Dg Chest Port 1 View  Result Date: 06/18/2017 CLINICAL DATA:  Cough.  Shortness of breath . EXAM: PORTABLE CHEST 1 VIEW COMPARISON:  06/16/2017.  03/12/2016.  10/16/2015. FINDINGS: Mediastinum and hilar structures are stable. Stable cardiomegaly. Persistent rounded densities noted over the right upper and mid chest most likely pleural-parenchymal scarring. Similar findings noted on multiple prior exams. Stable calcified nodular densities are noted bilaterally consistent with granulomas. No acute infiltrate. No prominent pleural effusion or pneumothorax . No acute bony abnormality. IMPRESSION: Stable rounded opacities noted over the right upper and mid chest most likely related pleural-parenchymal scarring. No change from multiple prior exams. Stable calcifications noted both lungs most likely granulomas. No acute abnormality identified. Electronically Signed   By: Marcello Moores  Register   On: 06/18/2017 11:32    Scheduled Meds: . aspirin EC  325 mg Oral Q breakfast  . brimonidine  1 drop Both Eyes TID  . brinzolamide  1 drop Both Eyes TID  . docusate  sodium  100 mg Oral BID  . ferrous gluconate  324 mg Oral Q lunch  . latanoprost  1 drop Both Eyes QHS  . Melatonin  3 mg Oral QHS  . memantine  5 mg Oral BID  . pantoprazole  40 mg Oral Daily  . QUEtiapine  25 mg Oral BID  . sertraline  50 mg Oral Daily  . sucralfate  1 g Oral TID WC & HS  . tamsulosin  0.4 mg Oral QPC supper   Continuous Infusions: . methocarbamol (ROBAXIN)  IV    . methocarbamol (ROBAXIN)  IV     PRN Meds: acetaminophen **OR** acetaminophen, haloperidol lactate, HYDROcodone-acetaminophen, menthol-cetylpyridinium **OR** phenol, methocarbamol **OR** methocarbamol (ROBAXIN)  IV, methocarbamol **OR** methocarbamol (ROBAXIN)  IV, metoCLOPramide **OR** metoCLOPramide (REGLAN) injection, morphine injection, ondansetron **OR** ondansetron (ZOFRAN) IV  Time spent: 35 minutes  Author: Berle Mull, MD [facility] Pager: [phone] 06/18/2017 6:13 PM  If 7PM-7AM, please contact night-coverage  at [email or web address], password [facility]

## 2017-06-19 DIAGNOSIS — E785 Hyperlipidemia, unspecified: Secondary | ICD-10-CM | POA: Diagnosis not present

## 2017-06-19 DIAGNOSIS — H409 Unspecified glaucoma: Secondary | ICD-10-CM | POA: Diagnosis not present

## 2017-06-19 DIAGNOSIS — F5101 Primary insomnia: Secondary | ICD-10-CM | POA: Diagnosis not present

## 2017-06-19 DIAGNOSIS — K210 Gastro-esophageal reflux disease with esophagitis: Secondary | ICD-10-CM

## 2017-06-19 DIAGNOSIS — R1312 Dysphagia, oropharyngeal phase: Secondary | ICD-10-CM | POA: Diagnosis not present

## 2017-06-19 DIAGNOSIS — S728X9A Other fracture of unspecified femur, initial encounter for closed fracture: Secondary | ICD-10-CM | POA: Diagnosis not present

## 2017-06-19 DIAGNOSIS — K219 Gastro-esophageal reflux disease without esophagitis: Secondary | ICD-10-CM | POA: Diagnosis not present

## 2017-06-19 DIAGNOSIS — L988 Other specified disorders of the skin and subcutaneous tissue: Secondary | ICD-10-CM | POA: Diagnosis not present

## 2017-06-19 DIAGNOSIS — R278 Other lack of coordination: Secondary | ICD-10-CM | POA: Diagnosis not present

## 2017-06-19 DIAGNOSIS — N400 Enlarged prostate without lower urinary tract symptoms: Secondary | ICD-10-CM | POA: Diagnosis not present

## 2017-06-19 DIAGNOSIS — S72141D Displaced intertrochanteric fracture of right femur, subsequent encounter for closed fracture with routine healing: Secondary | ICD-10-CM | POA: Diagnosis not present

## 2017-06-19 DIAGNOSIS — E559 Vitamin D deficiency, unspecified: Secondary | ICD-10-CM | POA: Diagnosis not present

## 2017-06-19 DIAGNOSIS — M6281 Muscle weakness (generalized): Secondary | ICD-10-CM | POA: Diagnosis not present

## 2017-06-19 DIAGNOSIS — T17908S Unspecified foreign body in respiratory tract, part unspecified causing other injury, sequela: Secondary | ICD-10-CM | POA: Diagnosis not present

## 2017-06-19 DIAGNOSIS — Z79899 Other long term (current) drug therapy: Secondary | ICD-10-CM | POA: Diagnosis not present

## 2017-06-19 DIAGNOSIS — R001 Bradycardia, unspecified: Secondary | ICD-10-CM

## 2017-06-19 DIAGNOSIS — Z111 Encounter for screening for respiratory tuberculosis: Secondary | ICD-10-CM | POA: Diagnosis not present

## 2017-06-19 DIAGNOSIS — Z4789 Encounter for other orthopedic aftercare: Secondary | ICD-10-CM | POA: Diagnosis not present

## 2017-06-19 DIAGNOSIS — F0150 Vascular dementia without behavioral disturbance: Secondary | ICD-10-CM

## 2017-06-19 DIAGNOSIS — S72142D Displaced intertrochanteric fracture of left femur, subsequent encounter for closed fracture with routine healing: Secondary | ICD-10-CM | POA: Diagnosis not present

## 2017-06-19 DIAGNOSIS — G8911 Acute pain due to trauma: Secondary | ICD-10-CM | POA: Diagnosis not present

## 2017-06-19 DIAGNOSIS — S72142A Displaced intertrochanteric fracture of left femur, initial encounter for closed fracture: Secondary | ICD-10-CM | POA: Diagnosis not present

## 2017-06-19 DIAGNOSIS — D649 Anemia, unspecified: Secondary | ICD-10-CM | POA: Diagnosis not present

## 2017-06-19 DIAGNOSIS — N401 Enlarged prostate with lower urinary tract symptoms: Secondary | ICD-10-CM | POA: Diagnosis not present

## 2017-06-19 DIAGNOSIS — J690 Pneumonitis due to inhalation of food and vomit: Secondary | ICD-10-CM | POA: Diagnosis not present

## 2017-06-19 DIAGNOSIS — I10 Essential (primary) hypertension: Secondary | ICD-10-CM

## 2017-06-19 DIAGNOSIS — R52 Pain, unspecified: Secondary | ICD-10-CM | POA: Diagnosis not present

## 2017-06-19 DIAGNOSIS — R451 Restlessness and agitation: Secondary | ICD-10-CM | POA: Diagnosis not present

## 2017-06-19 DIAGNOSIS — S72012A Unspecified intracapsular fracture of left femur, initial encounter for closed fracture: Secondary | ICD-10-CM | POA: Diagnosis not present

## 2017-06-19 DIAGNOSIS — E876 Hypokalemia: Secondary | ICD-10-CM | POA: Diagnosis not present

## 2017-06-19 DIAGNOSIS — R262 Difficulty in walking, not elsewhere classified: Secondary | ICD-10-CM | POA: Diagnosis not present

## 2017-06-19 DIAGNOSIS — M25552 Pain in left hip: Secondary | ICD-10-CM | POA: Diagnosis not present

## 2017-06-19 DIAGNOSIS — K5900 Constipation, unspecified: Secondary | ICD-10-CM | POA: Diagnosis not present

## 2017-06-19 DIAGNOSIS — Z9181 History of falling: Secondary | ICD-10-CM | POA: Diagnosis not present

## 2017-06-19 LAB — CBC
HEMATOCRIT: 25.9 % — AB (ref 39.0–52.0)
HEMOGLOBIN: 8.4 g/dL — AB (ref 13.0–17.0)
MCH: 31.3 pg (ref 26.0–34.0)
MCHC: 32.4 g/dL (ref 30.0–36.0)
MCV: 96.6 fL (ref 78.0–100.0)
Platelets: 129 10*3/uL — ABNORMAL LOW (ref 150–400)
RBC: 2.68 MIL/uL — ABNORMAL LOW (ref 4.22–5.81)
RDW: 16.3 % — ABNORMAL HIGH (ref 11.5–15.5)
WBC: 11.3 10*3/uL — ABNORMAL HIGH (ref 4.0–10.5)

## 2017-06-19 LAB — BASIC METABOLIC PANEL
Anion gap: 7 (ref 5–15)
BUN: 16 mg/dL (ref 6–20)
CHLORIDE: 104 mmol/L (ref 101–111)
CO2: 27 mmol/L (ref 22–32)
CREATININE: 1.08 mg/dL (ref 0.61–1.24)
Calcium: 8.9 mg/dL (ref 8.9–10.3)
GFR calc non Af Amer: 57 mL/min — ABNORMAL LOW (ref >60)
Glucose, Bld: 114 mg/dL — ABNORMAL HIGH (ref 65–99)
Potassium: 3.2 mmol/L — ABNORMAL LOW (ref 3.5–5.1)
Sodium: 138 mmol/L (ref 135–145)

## 2017-06-19 MED ORDER — HYDROCODONE-ACETAMINOPHEN 5-325 MG PO TABS: 1.0000 | ORAL_TABLET | Freq: Four times a day (QID) | ORAL | Status: DC | PRN

## 2017-06-19 MED ORDER — QUETIAPINE FUMARATE 25 MG PO TABS: 25.0000 mg | ORAL_TABLET | Freq: Every day | ORAL | Status: DC

## 2017-06-19 MED ORDER — MAGNESIUM SULFATE 2 GM/50ML IV SOLN
2.0000 g | Freq: Once | INTRAVENOUS | Status: DC
  Filled 2017-06-19: qty 50

## 2017-06-19 MED ORDER — ACETAMINOPHEN 325 MG PO TABS
650.0000 mg | ORAL_TABLET | Freq: Four times a day (QID) | ORAL | Status: DC
  Administered 2017-06-19 (×2): 650 mg via ORAL
  Filled 2017-06-19 (×2): qty 2

## 2017-06-19 MED ORDER — OLANZAPINE 5 MG PO TBDP: 2.5000 mg | ORAL_TABLET | Freq: Every day | ORAL | Status: AC | PRN

## 2017-06-19 MED ORDER — ASPIRIN 325 MG PO TBEC: 325.0000 mg | DELAYED_RELEASE_TABLET | Freq: Every day | ORAL | 0 refills | Status: AC

## 2017-06-19 MED ORDER — TAMSULOSIN HCL 0.4 MG PO CAPS: 0.4000 mg | ORAL_CAPSULE | Freq: Every day | ORAL | Status: AC

## 2017-06-19 MED ORDER — DOCUSATE SODIUM 100 MG PO CAPS
100.0000 mg | ORAL_CAPSULE | Freq: Two times a day (BID) | ORAL | 0 refills | Status: AC
Start: 2017-06-19 — End: ?

## 2017-06-19 MED ORDER — QUETIAPINE FUMARATE 25 MG PO TABS: 50.0000 mg | ORAL_TABLET | Freq: Every day | ORAL | Status: AC

## 2017-06-19 MED ORDER — ACETAMINOPHEN 325 MG PO TABS: 650.0000 mg | ORAL_TABLET | Freq: Four times a day (QID) | ORAL | 0 refills | Status: AC | PRN

## 2017-06-19 MED ORDER — POTASSIUM CHLORIDE CRYS ER 20 MEQ PO TBCR
40.0000 meq | EXTENDED_RELEASE_TABLET | ORAL | Status: AC
  Administered 2017-06-19 (×2): 40 meq via ORAL
  Filled 2017-06-19 (×2): qty 2

## 2017-06-19 MED ORDER — ACETAMINOPHEN 650 MG RE SUPP: 650.0000 mg | Freq: Four times a day (QID) | RECTAL | Status: DC

## 2017-06-19 MED ORDER — OLANZAPINE 5 MG PO TBDP: 2.5000 mg | ORAL_TABLET | Freq: Every day | ORAL | Status: DC | PRN

## 2017-06-19 NOTE — Progress Notes (Signed)
CSW contacted [facility] to confirm transport so pt can get to SNF by 6pm-they are in route.  Percell Locus Harvis Mabus LCSWA [phone]

## 2017-06-19 NOTE — Progress Notes (Signed)
Patient ID: Alexander Goodwin, male   DOB: 1921/03/07, 83 y.o.   MRN: [id number] Left hip stable.  Awaiting skilled nursing placement.  Can continue attempts to mobilize.  Certainly a fall risk.  Will follow-up in 2 weeks as an outpatient.

## 2017-06-19 NOTE — Clinical Social Work Placement (Signed)
   CLINICAL SOCIAL WORK PLACEMENT  NOTE  Date:  06/19/2017  Patient Details  Name: Alexander Goodwin MRN: [id number] Date of Birth: 1921-06-14  Clinical Social Work is seeking post-discharge placement for this patient at the [place] level of care (*CSW will initial, date and re-position this form in  chart as items are completed):  Yes   Patient/family provided with [facility] Work Department's list of facilities offering this level of care within the geographic area requested by the patient (or if unable, by the patient's family).  Yes   Patient/family informed of their freedom to choose among providers that offer the needed level of care, that participate in Medicare, Medicaid or managed care program needed by the patient, have an available bed and are willing to accept the patient.  Yes   Patient/family informed of [facility]'s ownership interest in [facility] and [facility], as well as of the fact that they are under no obligation to receive care at these facilities.  PASRR submitted to EDS on       PASRR number received on       Existing PASRR number confirmed on 06/17/17     FL2 transmitted to all facilities in geographic area requested by pt/family on 06/17/17     FL2 transmitted to all facilities within larger geographic area on       Patient informed that his/her managed care company has contracts with or will negotiate with certain facilities, including the following:        Yes   Patient/family informed of bed offers received.  Patient chooses bed at [facility]     Physician recommends and patient chooses bed at      Patient to be transferred to [facility] on 06/19/17.  Patient to be transferred to facility by [facility]     Patient family notified on 06/19/17 of transfer.  Name of family member notified:  Son     PHYSICIAN       Additional Comment:    _______________________________________________ Benard Halsted,  [place] 06/19/2017, 12:56 PM

## 2017-06-19 NOTE — Discharge Summary (Signed)
Physician Discharge Summary  AADAM ZHEN FIE:[id number] DOB: 1921-01-05 DOA: 06/16/2017  PCP: Coral Spikes, DO  Admit date: 06/16/2017 Discharge date: 06/19/2017  Admitted From: ALF  Disposition:  SNF   Discharge Condition:  stable CODE STATUS:  DNR   Consultations:  Ortho     Discharge Diagnoses:  Principal Problem:   Displaced intertrochanteric fracture of left femur, init ([place]) Active Problems:   GERD (gastroesophageal reflux disease)   BPH (benign prostatic hyperplasia)   Hyperlipemia   Hypertension   Dementia, vascular   Hip fracture (HCC)   Anemia   Bradycardia   Chronic pulmonary aspiration    Subjective: No complaints.   HPI: Alexander Goodwin is a very pleasant 83 y.o. male who resides in ALF and has a medical history significant for hypertension, hyperlipidemia, vascular dementia, right upper lobe lung abscess/chronic aspiration, dysphagia, BPH, GERD, glaucoma resents to the emergency department from a facility with chief complaint of fall and left hip pain.  Initial evaluation reveals left hip fracture.    Information is obtained from the patient and the chart noting that information from patient is unreliable due to dementia.  He states he "remembers falling" but does not remember details.  He complains of pain in his left hip worse with movement.  He denies pain anywhere else.  There is no record of him being ill recently.  No reports of fever chills nausea vomiting diarrhea.  Patient denies dysuria hematuria frequency or urgency.  Chart does indicate he has a history of an unsteady gait.  Unclear how long he was on the floor.  Patient denies chest pain palpitations shortness of breath.  Hospital Course:  Mechanical fall. Closed left hip fracture. S/P left hip intramedullary rod and hip screw placement. Pain is well controlled. Hemoglobin and serum creatinine relatively stable. PT OT recommends SNF - DVT prophylaxis 325 mg of Aspirin daily - no Narcotics  prescribed   Bradycardia - BP stable with HR in 40-60s- not on any medications to cause this- doubt he is a candidate for a pacemaker   Dementia with acute delirium in the hospital - has had a significant amount of delirium in the the hospital which will likely continue at the SNF- he was able to wake up adequately to have breakfast this morning - Seroquel added at 6 PM which helped him rest through the night last night  - can use PRN ODT Zyprexa during the day  Hypokalemia - replaced- add once a day Mg+   Hypertension/ BPH - Dr Posey Pronto Changed doxazosin to Flomax due to hypotension - BP in 130-140 range today    Anemia -Normocytic and acute blood loss - hemoglobin 10.8 on admission and has dropped to 8-9 range where it has been over the past few days - cont daily Iron  Chronic aspiration  Continue dysphagia diet  Discharge Instructions  Discharge Instructions    Diet - low sodium heart healthy    Complete by:  As directed    Full weight bearing    Complete by:  As directed    Increase activity slowly    Complete by:  As directed      Allergies as of 06/19/2017      Reactions   Clindamycin/lincomycin Itching   Erythromycin Base Rash      Medication List    TAKE these medications   acetaminophen 325 MG tablet Commonly known as:  TYLENOL Take 2 tablets (650 mg total) by mouth every 6 (six) hours as  needed for mild pain (or Fever >/= 101).   acidophilus Caps capsule Take 1 capsule by mouth daily.   aspirin 325 MG EC tablet Take 1 tablet (325 mg total) by mouth daily with breakfast.   brimonidine 0.1 % Soln Commonly known as:  ALPHAGAN P Place 1 drop into both eyes 3 (three) times daily.   brinzolamide 1 % ophthalmic suspension Commonly known as:  AZOPT Place 1 drop into both eyes 3 (three) times daily.   docusate sodium 100 MG capsule Commonly known as:  COLACE Take 1 capsule (100 mg total) by mouth 2 (two) times daily.   IRON 27 240 (27 FE) MG  tablet Generic drug:  ferrous gluconate Take 240 mg by mouth daily.   latanoprost 0.005 % ophthalmic solution Commonly known as:  XALATAN Place 1 drop into both eyes at bedtime.   Melatonin 3 MG Tabs Take 3 mg by mouth at bedtime.   Mineral Oil Heavy Oil Place 2 drops into both ears every Wednesday.   MINERIN Crea Apply 1 application topically daily as needed (to both feet for dry skin).   NAMENDA 5 MG tablet Generic drug:  memantine Take 5 mg by mouth 2 (two) times daily.   OCUVITE-LUTEIN PO Take 1 capsule by mouth daily.   OLANZapine zydis 5 MG disintegrating tablet Commonly known as:  ZYPREXA Take 0.5 tablets (2.5 mg total) by mouth daily as needed (severe agitation).   OSCAL 500/200 D-3 500-200 MG-UNIT tablet Generic drug:  calcium-vitamin D Take 1 tablet by mouth daily.   pantoprazole 40 MG tablet Commonly known as:  PROTONIX Take 40 mg by mouth daily.   QUEtiapine 25 MG tablet Commonly known as:  SEROQUEL Take 2 tablets (50 mg total) by mouth daily at 6 PM.   sertraline 50 MG tablet Commonly known as:  ZOLOFT Take 50 mg by mouth daily.   sucralfate 1 g tablet Commonly known as:  CARAFATE Take 1 tablet by mouth 3 (three) times daily.   tamsulosin 0.4 MG Caps capsule Commonly known as:  FLOMAX Take 1 capsule (0.4 mg total) by mouth daily after supper.            Discharge Care Instructions        Start     Ordered   06/19/17 0000  Full weight bearing     06/19/17 1610     Follow-up Information    Mcarthur Rossetti, MD. Schedule an appointment as soon as possible for a visit in 2 week(s).   Specialty:  Orthopedic Surgery Contact information: [address] [place] [address] [phone]          Allergies  Allergen Reactions  . Clindamycin/Lincomycin Itching  . Erythromycin Base Rash     Procedures/Studies:   10/28- Dr Ninfa Linden -  Open reduction and internal fixation of left intertrochanteric proximal femur  fracture using intramedullary rod and hip screw construct.  Dg Chest 1 View  Result Date: 06/16/2017 CLINICAL DATA:  Status post unwitnessed fall, with concern for chest injury. Initial encounter. EXAM: CHEST 1 VIEW COMPARISON:  Chest radiograph performed 03/12/2016 FINDINGS: The lungs are hypoexpanded. Right-sided airspace opacities are better characterized on prior studies due to current lung hypoexpansion and positioning. These are difficult to compare with the prior studies. No pleural effusion or pneumothorax is seen. The cardiomediastinal silhouettes is borderline enlarged. No acute osseous abnormalities are identified. IMPRESSION: 1. No displaced rib fracture seen. 2. Lungs hypoexpanded. Right-sided airspace opacities again noted, better characterized on  prior studies due to current lung hypoexpansion and positioning. 3. Borderline cardiomegaly. Electronically Signed   By: Garald Balding M.D.   On: 06/16/2017 07:07   Ct Head Wo Contrast  Result Date: 06/16/2017 CLINICAL DATA:  Unwitnessed fall.  Initial encounter. EXAM: CT HEAD WITHOUT CONTRAST CT CERVICAL SPINE WITHOUT CONTRAST TECHNIQUE: Multidetector CT imaging of the head and cervical spine was performed following the standard protocol without intravenous contrast. Multiplanar CT image reconstructions of the cervical spine were also generated. COMPARISON:  Head CT 02/06/2015 FINDINGS: CT HEAD FINDINGS Brain: Moderate generalized cerebral atrophy is unchanged. Periventricular white matter hypodensities are stable to minimally increased in are nonspecific but compatible with chronic small vessel ischemic disease, mild for age. There is no evidence of acute infarct, intracranial hemorrhage, mass, midline shift, or extra-axial fluid collection. Vascular: Calcified atherosclerosis at the skullbase. No hyperdense vessel. Skull: No fracture or focal osseous lesion. Sinuses/Orbits: Bilateral cataract extraction. Moderate bilateral ethmoid air cell  opacification. Milder bilateral frontal sinus mucosal thickening. Clear mastoid air cells. Other: None. CT CERVICAL SPINE FINDINGS Alignment: Reversal of the normal cervical lordosis. 2 mm anterolisthesis of C7 on T1, likely degenerative given facet arthrosis. Skull base and vertebrae: No evidence of acute fracture or destructive osseous lesion. Soft tissues and spinal canal: No prevertebral fluid or swelling. No visible canal hematoma. Disc levels: Advanced disc degeneration throughout the cervical spine. Interbody ankylosis at C2-3 and C3-4. Facet ankylosis bilaterally at C2-3 and on the right at C3-4. Degenerative endplate sclerosis and spurring from C4-C7. Asymmetric moderate left facet arthrosis at C7-T1. Upper chest: Partially visualized pleural thickening/ scarring in the right greater than left lung apices. Other: Calcified atherosclerosis at the carotid bifurcations. IMPRESSION: 1. No evidence of acute intracranial abnormality. 2. Cerebral atrophy and mild chronic small vessel ischemic disease. 3. No evidence of acute fracture or traumatic subluxation in the cervical spine. Advanced diffuse disc degeneration. Electronically Signed   By: Logan Bores M.D.   On: 06/16/2017 08:55   Ct Cervical Spine Wo Contrast  Result Date: 06/16/2017 CLINICAL DATA:  Unwitnessed fall.  Initial encounter. EXAM: CT HEAD WITHOUT CONTRAST CT CERVICAL SPINE WITHOUT CONTRAST TECHNIQUE: Multidetector CT imaging of the head and cervical spine was performed following the standard protocol without intravenous contrast. Multiplanar CT image reconstructions of the cervical spine were also generated. COMPARISON:  Head CT 02/06/2015 FINDINGS: CT HEAD FINDINGS Brain: Moderate generalized cerebral atrophy is unchanged. Periventricular white matter hypodensities are stable to minimally increased in are nonspecific but compatible with chronic small vessel ischemic disease, mild for age. There is no evidence of acute infarct, intracranial  hemorrhage, mass, midline shift, or extra-axial fluid collection. Vascular: Calcified atherosclerosis at the skullbase. No hyperdense vessel. Skull: No fracture or focal osseous lesion. Sinuses/Orbits: Bilateral cataract extraction. Moderate bilateral ethmoid air cell opacification. Milder bilateral frontal sinus mucosal thickening. Clear mastoid air cells. Other: None. CT CERVICAL SPINE FINDINGS Alignment: Reversal of the normal cervical lordosis. 2 mm anterolisthesis of C7 on T1, likely degenerative given facet arthrosis. Skull base and vertebrae: No evidence of acute fracture or destructive osseous lesion. Soft tissues and spinal canal: No prevertebral fluid or swelling. No visible canal hematoma. Disc levels: Advanced disc degeneration throughout the cervical spine. Interbody ankylosis at C2-3 and C3-4. Facet ankylosis bilaterally at C2-3 and on the right at C3-4. Degenerative endplate sclerosis and spurring from C4-C7. Asymmetric moderate left facet arthrosis at C7-T1. Upper chest: Partially visualized pleural thickening/ scarring in the right greater than left lung apices. Other: Calcified atherosclerosis  at the carotid bifurcations. IMPRESSION: 1. No evidence of acute intracranial abnormality. 2. Cerebral atrophy and mild chronic small vessel ischemic disease. 3. No evidence of acute fracture or traumatic subluxation in the cervical spine. Advanced diffuse disc degeneration. Electronically Signed   By: Logan Bores M.D.   On: 06/16/2017 08:55   Dg Chest Port 1 View  Result Date: 06/18/2017 CLINICAL DATA:  Cough.  Shortness of breath . EXAM: PORTABLE CHEST 1 VIEW COMPARISON:  06/16/2017.  03/12/2016.  10/16/2015. FINDINGS: Mediastinum and hilar structures are stable. Stable cardiomegaly. Persistent rounded densities noted over the right upper and mid chest most likely pleural-parenchymal scarring. Similar findings noted on multiple prior exams. Stable calcified nodular densities are noted bilaterally  consistent with granulomas. No acute infiltrate. No prominent pleural effusion or pneumothorax . No acute bony abnormality. IMPRESSION: Stable rounded opacities noted over the right upper and mid chest most likely related pleural-parenchymal scarring. No change from multiple prior exams. Stable calcifications noted both lungs most likely granulomas. No acute abnormality identified. Electronically Signed   By: Marcello Moores  Register   On: 06/18/2017 11:32   Dg C-arm 1-60 Min  Result Date: 06/16/2017 CLINICAL DATA:  Left intramedullary nail fixation EXAM: DG C-ARM 61-120 MIN; LEFT FEMUR 2 VIEWS COMPARISON:  Radiograph from earlier today FINDINGS: Intramedullary nail fixation of the left femur with dynamic hip screw. No evidence of intraprocedural fracture. The left hip is located. IMPRESSION: Fluoroscopy for left intertrochanteric femur fracture fixation. Electronically Signed   By: Monte Fantasia M.D.   On: 06/16/2017 18:18   Dg Hip Unilat With Pelvis 2-3 Views Left  Result Date: 06/16/2017 CLINICAL DATA:  Status post fall, with left hip pain. Initial encounter. EXAM: DG HIP (WITH OR WITHOUT PELVIS) 2-3V LEFT COMPARISON:  None. FINDINGS: There is a mildly displaced left femoral intertrochanteric fracture. The left femoral head remains seated at the acetabulum. The right hip joint is unremarkable in appearance. Mild degenerative change is noted at the lower lumbar spine. The sacroiliac joints are grossly unremarkable. The visualized bowel gas pattern is unremarkable in appearance. Scattered vascular calcifications are seen. IMPRESSION: 1. Mildly displaced left femoral intertrochanteric fracture. 2. Scattered vascular calcifications seen. Electronically Signed   By: Garald Balding M.D.   On: 06/16/2017 07:05   Dg Femur Min 2 Views Left  Result Date: 06/16/2017 CLINICAL DATA:  Left intramedullary nail fixation EXAM: DG C-ARM 61-120 MIN; LEFT FEMUR 2 VIEWS COMPARISON:  Radiograph from earlier today FINDINGS:  Intramedullary nail fixation of the left femur with dynamic hip screw. No evidence of intraprocedural fracture. The left hip is located. IMPRESSION: Fluoroscopy for left intertrochanteric femur fracture fixation. Electronically Signed   By: Monte Fantasia M.D.   On: 06/16/2017 18:18       Discharge Exam: Vitals:   06/18/17 2154 06/19/17 0630  BP: 109/72 (!) 148/53  Pulse: 74 67  Resp: 17 16  Temp: 98.2 F (36.8 C) 99.2 F (37.3 C)  SpO2: 100%    Vitals:   06/18/17 1419 06/18/17 1422 06/18/17 2154 06/19/17 0630  BP: (!) 148/56 (!) 137/54 109/72 (!) 148/53  Pulse: 75 73 74 67  Resp: 18 18 17 16   Temp: 98.5 F (36.9 C) 98.2 F (36.8 C) 98.2 F (36.8 C) 99.2 F (37.3 C)  TempSrc: Oral Oral Oral Axillary  SpO2: 96% 95% 100%     General: Pt is alert, awake, not in acute distress-  Cardiovascular: RRR, S1/S2 +, no rubs, no gallops Respiratory: CTA bilaterally, no wheezing, no rhonchi  Abdominal: Soft, NT, ND, bowel sounds + Extremities: no edema, no cyanosis Psych: recognizes son, otherwise confused to place and situation    The results of significant diagnostics from this hospitalization (including imaging, microbiology, ancillary and laboratory) are listed below for reference.     Microbiology: Recent Results (from the past 240 hour(s))  MRSA PCR Screening     Status: None   Collection Time: 06/17/17 12:37 AM  Result Value Ref Range Status   MRSA by PCR NEGATIVE NEGATIVE Final    Comment:        The GeneXpert MRSA Assay (FDA approved for NASAL specimens only), is one component of a comprehensive MRSA colonization surveillance program. It is not intended to diagnose MRSA infection nor to guide or monitor treatment for MRSA infections.      Labs: BNP (last 3 results) No results for input(s): BNP in the last 8760 hours. Basic Metabolic Panel:  Recent Labs Lab 06/16/17 0704 06/17/17 0634 06/18/17 0519 06/19/17 0623  NA 138 137 136 138  K 3.5 3.7 3.4*  3.2*  CL 103 102 102 104  CO2 28 28 25 27   GLUCOSE 115* 124* 115* 114*  BUN 12 12 16 16   CREATININE 1.12 1.05 1.19 1.08  CALCIUM 9.3 9.0 9.0 8.9   Liver Function Tests:  Recent Labs Lab 06/16/17 0704  AST 18  ALT 18  ALKPHOS 95  BILITOT 0.8  PROT 6.5  ALBUMIN 3.3*   No results for input(s): LIPASE, AMYLASE in the last 168 hours. No results for input(s): AMMONIA in the last 168 hours. CBC:  Recent Labs Lab 06/16/17 0704 06/17/17 0634 06/18/17 0519 06/19/17 0623  WBC 8.3 11.4* 14.9* 11.3*  NEUTROABS 6.0  --   --   --   HGB 10.9* 9.9* 8.9* 8.4*  HCT 34.2* 31.8* 27.4* 25.9*  MCV 96.6 97.5 95.5 96.6  PLT 156 145* 129* 129*   Cardiac Enzymes:  Recent Labs Lab 06/16/17 0926  CKTOTAL 73   BNP: Invalid input(s): POCBNP CBG: No results for input(s): GLUCAP in the last 168 hours. D-Dimer No results for input(s): DDIMER in the last 72 hours. Hgb A1c No results for input(s): HGBA1C in the last 72 hours. Lipid Profile No results for input(s): CHOL, HDL, LDLCALC, TRIG, CHOLHDL, LDLDIRECT in the last 72 hours. Thyroid function studies No results for input(s): TSH, T4TOTAL, T3FREE, THYROIDAB in the last 72 hours.  Invalid input(s): FREET3 Anemia work up No results for input(s): VITAMINB12, FOLATE, FERRITIN, TIBC, IRON, RETICCTPCT in the last 72 hours. Urinalysis    Component Value Date/Time   COLORURINE YELLOW 06/16/2017 0920   APPEARANCEUR CLEAR 06/16/2017 0920   APPEARANCEUR Clear 10/06/2013 1117   LABSPEC 1.013 06/16/2017 0920   LABSPEC 1.019 10/06/2013 1117   PHURINE 7.0 06/16/2017 0920   GLUCOSEU NEGATIVE 06/16/2017 0920   GLUCOSEU Negative 10/06/2013 1117   HGBUR NEGATIVE 06/16/2017 0920   BILIRUBINUR NEGATIVE 06/16/2017 0920   BILIRUBINUR Negative 10/06/2013 1117   KETONESUR NEGATIVE 06/16/2017 0920   PROTEINUR NEGATIVE 06/16/2017 0920   UROBILINOGEN 0.2 07/13/2010 1133   NITRITE NEGATIVE 06/16/2017 0920   LEUKOCYTESUR NEGATIVE 06/16/2017 0920    LEUKOCYTESUR Negative 10/06/2013 1117   Sepsis Labs Invalid input(s): PROCALCITONIN,  WBC,  LACTICIDVEN Microbiology Recent Results (from the past 240 hour(s))  MRSA PCR Screening     Status: None   Collection Time: 06/17/17 12:37 AM  Result Value Ref Range Status   MRSA by PCR NEGATIVE NEGATIVE Final    Comment:  The GeneXpert MRSA Assay (FDA approved for NASAL specimens only), is one component of a comprehensive MRSA colonization surveillance program. It is not intended to diagnose MRSA infection nor to guide or monitor treatment for MRSA infections.      Time coordinating discharge: Over 30 minutes  SIGNED:   Debbe Odea, MD  [facility] 06/19/2017, 12:09 PM Pager   If 7PM-7AM, please contact night-coverage [email or web address] Password [email or web address]

## 2017-06-19 NOTE — Progress Notes (Signed)
[facility] Morrow to be D/C'd Skilled nursing facility per MD order.  Discussed with the patient and all questions fully answered.  VSS, Skin clean, dry and intact without evidence of skin break down, no evidence of skin tears noted. IV catheter discontinued intact. Site without signs and symptoms of complications. Dressing and pressure applied.  An After Visit Summary was printed and given to the patient. Patient received prescription.  D/c education completed with patient/family including follow up instructions, medication list, d/c activities limitations if indicated, with other d/c instructions as indicated by MD - patient able to verbalize understanding, all questions fully answered.   Patient instructed to return to ED, call 911, or call MD for any changes in condition.   Patient escorted via stretcher, D/C to SNF [facility] by Ambulance Holley Raring 06/19/2017 1:27 PM

## 2017-06-19 NOTE — Progress Notes (Signed)
Patient will DC to: [facility] Anticipated DC date: 06/19/17 Family notified: Son Transport by: [facility] 1:30pm   Per MD patient ready for DC to [facility]. RN, patient, patient's family, and facility notified of DC. Discharge Summary sent to facility. RN given number for report [phone]). DC packet on chart. Ambulance transport requested for patient.   CSW signing off.  Cedric Fishman, [place] Social Worker [phone]

## 2017-06-20 DIAGNOSIS — M6281 Muscle weakness (generalized): Secondary | ICD-10-CM | POA: Diagnosis not present

## 2017-06-20 DIAGNOSIS — E559 Vitamin D deficiency, unspecified: Secondary | ICD-10-CM | POA: Diagnosis not present

## 2017-06-20 DIAGNOSIS — K5900 Constipation, unspecified: Secondary | ICD-10-CM | POA: Diagnosis not present

## 2017-06-20 DIAGNOSIS — E876 Hypokalemia: Secondary | ICD-10-CM | POA: Diagnosis not present

## 2017-06-20 DIAGNOSIS — S72012A Unspecified intracapsular fracture of left femur, initial encounter for closed fracture: Secondary | ICD-10-CM | POA: Diagnosis not present

## 2017-06-20 DIAGNOSIS — L988 Other specified disorders of the skin and subcutaneous tissue: Secondary | ICD-10-CM | POA: Diagnosis not present

## 2017-06-20 DIAGNOSIS — R52 Pain, unspecified: Secondary | ICD-10-CM | POA: Diagnosis not present

## 2017-06-20 DIAGNOSIS — I10 Essential (primary) hypertension: Secondary | ICD-10-CM | POA: Diagnosis not present

## 2017-06-20 DIAGNOSIS — D649 Anemia, unspecified: Secondary | ICD-10-CM | POA: Diagnosis not present

## 2017-06-20 DIAGNOSIS — K219 Gastro-esophageal reflux disease without esophagitis: Secondary | ICD-10-CM | POA: Diagnosis not present

## 2017-06-20 DIAGNOSIS — H409 Unspecified glaucoma: Secondary | ICD-10-CM | POA: Diagnosis not present

## 2017-06-20 DIAGNOSIS — N401 Enlarged prostate with lower urinary tract symptoms: Secondary | ICD-10-CM | POA: Diagnosis not present

## 2017-06-24 ENCOUNTER — Encounter (INDEPENDENT_AMBULATORY_CARE_PROVIDER_SITE_OTHER): Payer: Self-pay | Admitting: Physician Assistant

## 2017-06-24 ENCOUNTER — Ambulatory Visit (INDEPENDENT_AMBULATORY_CARE_PROVIDER_SITE_OTHER): Payer: No Typology Code available for payment source

## 2017-06-24 ENCOUNTER — Ambulatory Visit (INDEPENDENT_AMBULATORY_CARE_PROVIDER_SITE_OTHER): Payer: No Typology Code available for payment source | Admitting: Physician Assistant

## 2017-06-24 DIAGNOSIS — I10 Essential (primary) hypertension: Secondary | ICD-10-CM | POA: Diagnosis not present

## 2017-06-24 DIAGNOSIS — K5900 Constipation, unspecified: Secondary | ICD-10-CM | POA: Diagnosis not present

## 2017-06-24 DIAGNOSIS — K219 Gastro-esophageal reflux disease without esophagitis: Secondary | ICD-10-CM | POA: Diagnosis not present

## 2017-06-24 DIAGNOSIS — M25552 Pain in left hip: Secondary | ICD-10-CM

## 2017-06-24 DIAGNOSIS — M6281 Muscle weakness (generalized): Secondary | ICD-10-CM | POA: Diagnosis not present

## 2017-06-24 DIAGNOSIS — R52 Pain, unspecified: Secondary | ICD-10-CM | POA: Diagnosis not present

## 2017-06-24 DIAGNOSIS — L988 Other specified disorders of the skin and subcutaneous tissue: Secondary | ICD-10-CM | POA: Diagnosis not present

## 2017-06-24 DIAGNOSIS — S72012A Unspecified intracapsular fracture of left femur, initial encounter for closed fracture: Secondary | ICD-10-CM | POA: Diagnosis not present

## 2017-06-24 DIAGNOSIS — E559 Vitamin D deficiency, unspecified: Secondary | ICD-10-CM | POA: Diagnosis not present

## 2017-06-24 DIAGNOSIS — H409 Unspecified glaucoma: Secondary | ICD-10-CM | POA: Diagnosis not present

## 2017-06-24 DIAGNOSIS — E876 Hypokalemia: Secondary | ICD-10-CM | POA: Diagnosis not present

## 2017-06-24 DIAGNOSIS — D649 Anemia, unspecified: Secondary | ICD-10-CM | POA: Diagnosis not present

## 2017-06-24 DIAGNOSIS — S72142A Displaced intertrochanteric fracture of left femur, initial encounter for closed fracture: Secondary | ICD-10-CM

## 2017-06-24 DIAGNOSIS — N401 Enlarged prostate with lower urinary tract symptoms: Secondary | ICD-10-CM | POA: Diagnosis not present

## 2017-06-24 MED ORDER — HYDROCODONE-ACETAMINOPHEN 5-325 MG PO TABS: 1.0000 | ORAL_TABLET | Freq: Four times a day (QID) | ORAL | 0 refills | Status: AC | PRN

## 2017-06-24 NOTE — Progress Notes (Signed)
Office Visit Note   Patient: Alexander Goodwin           Date of Birth: Aug 22, 1920           MRN: [id number] Visit Date: 06/24/2017              Requested by: Coral Spikes, DO [address] [place], [place] [id number] PCP: Patient, No Pcp Per   Assessment & Plan: Visit Diagnoses:  1. Pain in left hip   2. Displaced intertrochanteric fracture of left femur, init (HCC)     Plan: He will follow-up with [place] next Monday for staple removal and wound check.  No prescription for Norco as written.  Questions encouraged and answered   Follow-Up Instructions: Return in about 7 days (around 07/01/2017).   Orders:  Orders Placed This Encounter  Procedures  . XR FEMUR MIN 2 VIEWS LEFT   Meds ordered this encounter  Medications  . HYDROcodone-acetaminophen (NORCO/VICODIN) 5-325 MG tablet    Sig: Take 1 tablet every 6 (six) hours as needed by mouth for moderate pain.    Dispense:  60 tablet    Refill:  0      Procedures: No procedures performed   Clinical Data: No additional findings.   Subjective: Chief Complaint  Patient presents with  . Left Leg - Fracture, Pain    HPI 8 days status post IM nailing of a left hip intertrochanteric fracture.  He is having pain in the hip.  States his pain is almost unbearable.  He has had no pain medication since yesterday evening. Review of Systems   Objective: Vital Signs: There were no vitals taken for this visit.  Physical Exam  Constitutional: He is oriented to person, place, and time. He appears well-developed and well-nourished. No distress.  Pulmonary/Chest: Effort normal.  Neurological: He is alert and oriented to person, place, and time.  Skin: He is not diaphoretic.    Ortho Exam Left hip incision well approximated with staples.  Significant ecchymosis.  Left calf supple nontender.   No signs of seroma or infection  He will follow-up with [place] next Monday for staple removal and wound check.  Specialty Comments:    No specialty comments available.  Imaging: No results found.   PMFS History: Patient Active Problem List   Diagnosis Date Noted  . Hip fracture ([place]) 06/16/2017  . Anemia 06/16/2017  . Bradycardia 06/16/2017  . Chronic pulmonary aspiration 06/16/2017  . Displaced intertrochanteric fracture of left femur, init ([place]) 06/16/2017  . Grief 07/19/2016  . Dementia, vascular 06/15/2015  . DNR (do not resuscitate) 09/15/2014  . Branch retinal vein occlusion 09/02/2014  . Cloudy posterior capsule 04/10/2013  . Pseudoaphakia 04/10/2013  . 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 Primary open angle glaucoma 12/06/2011  . Hyperlipemia   . Hypertension   . History of colon polyps   . Arthritis   . GERD (gastroesophageal reflux disease)   . Glaucoma   . History of esophagitis   . History of TIA (transient ischemic attack)   . BPH (benign prostatic hyperplasia)    Past Medical History:  Diagnosis Date  . Abscess of lung without pneumonia ([place])    Right upper lobe chronic lung abscess and prior left upper lobe lung abscess due to chronic aspiration  . Arthritis    right knee, hips, neck. He denies any major limitation in activities  . BPH (benign prostatic  hypertrophy) with urinary obstruction   . Dementia   . Diverticulitis of colon   . Dysphagia, oropharyngeal   . Esophagitis    Last EGD Fall '11   . GERD (gastroesophageal reflux disease)    well controlled with medication. No nocturnal symptoms on a regular basis  . Glaucoma    both eyes, followed closely by Dr. Janyth Contes  . History of colon polyps   . Hyperlipemia   . Hypertension   . Pancreatitis    resolved after GB surgery  . Seasonal allergies   . Stroke [facility])    TIAs - age 60    Family History  Problem Relation Age of Onset  . Diabetes Mother   . Hypertension Mother   . Heart disease Mother   . Stroke Father   . Heart disease Father   .  Diabetes Father   . Rheum arthritis Father   . Arthritis Father   . Cancer Sister        uterine cancer  . COPD Brother   . Hypertension Brother   . Cancer Brother        colon. brain, and hip  . Diabetes Brother   . Cancer Brother   . Diabetes Brother   . Cancer Sister        gyn malignancy  . Cancer Sister        pancreatic cancer  . Allergies Sister   . Allergies Son   . Asthma Sister   . Asthma Son   . Diabetes Maternal Grandmother   . Diabetes Maternal Grandfather   . Diabetes Paternal Grandmother   . Diabetes Paternal Grandfather     Past Surgical History:  Procedure Laterality Date  . APPENDECTOMY     '88 along with [place]  . CHOLECYSTECTOMY     '88 - laparotomy [facility])   Social History   Occupational History  . Occupation: Statistician: [employer]    Comment: superintendent for years, retired '11  Tobacco Use  . Smoking status: Former Smoker    Packs/day: 0.50    Years: 30.00    Pack years: 15.00    Types: Pipe, Cigars    Last attempt to quit: 08/20/1978    Years since quitting: 38.8  . Smokeless tobacco: Former Systems developer    Types: [place] date: 11/16/1990  Substance and Sexual Activity  . Alcohol use: No  . Drug use: No  . Sexual activity: Yes

## 2017-06-26 DIAGNOSIS — E559 Vitamin D deficiency, unspecified: Secondary | ICD-10-CM | POA: Diagnosis not present

## 2017-06-26 DIAGNOSIS — M6281 Muscle weakness (generalized): Secondary | ICD-10-CM | POA: Diagnosis not present

## 2017-06-26 DIAGNOSIS — K5900 Constipation, unspecified: Secondary | ICD-10-CM | POA: Diagnosis not present

## 2017-06-26 DIAGNOSIS — K219 Gastro-esophageal reflux disease without esophagitis: Secondary | ICD-10-CM | POA: Diagnosis not present

## 2017-06-26 DIAGNOSIS — L988 Other specified disorders of the skin and subcutaneous tissue: Secondary | ICD-10-CM | POA: Diagnosis not present

## 2017-06-26 DIAGNOSIS — R52 Pain, unspecified: Secondary | ICD-10-CM | POA: Diagnosis not present

## 2017-06-26 DIAGNOSIS — N401 Enlarged prostate with lower urinary tract symptoms: Secondary | ICD-10-CM | POA: Diagnosis not present

## 2017-06-26 DIAGNOSIS — I10 Essential (primary) hypertension: Secondary | ICD-10-CM | POA: Diagnosis not present

## 2017-06-26 DIAGNOSIS — E876 Hypokalemia: Secondary | ICD-10-CM | POA: Diagnosis not present

## 2017-06-26 DIAGNOSIS — S72012A Unspecified intracapsular fracture of left femur, initial encounter for closed fracture: Secondary | ICD-10-CM | POA: Diagnosis not present

## 2017-06-26 DIAGNOSIS — H409 Unspecified glaucoma: Secondary | ICD-10-CM | POA: Diagnosis not present

## 2017-06-26 DIAGNOSIS — D649 Anemia, unspecified: Secondary | ICD-10-CM | POA: Diagnosis not present

## 2017-06-27 DIAGNOSIS — D509 Iron deficiency anemia, unspecified: Secondary | ICD-10-CM | POA: Diagnosis not present

## 2017-06-27 DIAGNOSIS — M25552 Pain in left hip: Secondary | ICD-10-CM | POA: Diagnosis not present

## 2017-06-27 DIAGNOSIS — R1312 Dysphagia, oropharyngeal phase: Secondary | ICD-10-CM | POA: Diagnosis not present

## 2017-06-27 DIAGNOSIS — E876 Hypokalemia: Secondary | ICD-10-CM | POA: Diagnosis not present

## 2017-06-27 DIAGNOSIS — G4700 Insomnia, unspecified: Secondary | ICD-10-CM | POA: Diagnosis not present

## 2017-06-27 DIAGNOSIS — R52 Pain, unspecified: Secondary | ICD-10-CM | POA: Diagnosis not present

## 2017-06-27 DIAGNOSIS — F0150 Vascular dementia without behavioral disturbance: Secondary | ICD-10-CM | POA: Diagnosis not present

## 2017-06-27 DIAGNOSIS — S72012A Unspecified intracapsular fracture of left femur, initial encounter for closed fracture: Secondary | ICD-10-CM | POA: Diagnosis not present

## 2017-06-27 DIAGNOSIS — S72102D Unspecified trochanteric fracture of left femur, subsequent encounter for closed fracture with routine healing: Secondary | ICD-10-CM | POA: Diagnosis not present

## 2017-06-27 DIAGNOSIS — R339 Retention of urine, unspecified: Secondary | ICD-10-CM | POA: Diagnosis not present

## 2017-06-27 DIAGNOSIS — K5900 Constipation, unspecified: Secondary | ICD-10-CM | POA: Diagnosis not present

## 2017-06-27 DIAGNOSIS — E785 Hyperlipidemia, unspecified: Secondary | ICD-10-CM | POA: Diagnosis not present

## 2017-06-27 DIAGNOSIS — I10 Essential (primary) hypertension: Secondary | ICD-10-CM | POA: Diagnosis not present

## 2017-06-27 DIAGNOSIS — Z9181 History of falling: Secondary | ICD-10-CM | POA: Diagnosis not present

## 2017-06-27 DIAGNOSIS — F0390 Unspecified dementia without behavioral disturbance: Secondary | ICD-10-CM | POA: Diagnosis not present

## 2017-06-27 DIAGNOSIS — H4010X0 Unspecified open-angle glaucoma, stage unspecified: Secondary | ICD-10-CM | POA: Diagnosis not present

## 2017-06-27 DIAGNOSIS — M6281 Muscle weakness (generalized): Secondary | ICD-10-CM | POA: Diagnosis not present

## 2017-06-27 DIAGNOSIS — M810 Age-related osteoporosis without current pathological fracture: Secondary | ICD-10-CM | POA: Diagnosis not present

## 2017-06-27 DIAGNOSIS — R262 Difficulty in walking, not elsewhere classified: Secondary | ICD-10-CM | POA: Diagnosis not present

## 2017-06-27 DIAGNOSIS — N401 Enlarged prostate with lower urinary tract symptoms: Secondary | ICD-10-CM | POA: Diagnosis not present

## 2017-06-27 DIAGNOSIS — K219 Gastro-esophageal reflux disease without esophagitis: Secondary | ICD-10-CM | POA: Diagnosis not present

## 2017-06-27 DIAGNOSIS — E559 Vitamin D deficiency, unspecified: Secondary | ICD-10-CM | POA: Diagnosis not present

## 2017-06-27 DIAGNOSIS — D649 Anemia, unspecified: Secondary | ICD-10-CM | POA: Diagnosis not present

## 2017-06-27 DIAGNOSIS — L988 Other specified disorders of the skin and subcutaneous tissue: Secondary | ICD-10-CM | POA: Diagnosis not present

## 2017-06-27 DIAGNOSIS — H409 Unspecified glaucoma: Secondary | ICD-10-CM | POA: Diagnosis not present

## 2017-06-27 DIAGNOSIS — S72142D Displaced intertrochanteric fracture of left femur, subsequent encounter for closed fracture with routine healing: Secondary | ICD-10-CM | POA: Diagnosis not present

## 2017-07-02 ENCOUNTER — Encounter (INDEPENDENT_AMBULATORY_CARE_PROVIDER_SITE_OTHER): Payer: Self-pay | Admitting: Physician Assistant

## 2017-07-02 ENCOUNTER — Ambulatory Visit (INDEPENDENT_AMBULATORY_CARE_PROVIDER_SITE_OTHER): Payer: Medicare Other | Admitting: Physician Assistant

## 2017-07-02 DIAGNOSIS — D509 Iron deficiency anemia, unspecified: Secondary | ICD-10-CM | POA: Diagnosis not present

## 2017-07-02 DIAGNOSIS — Z9181 History of falling: Secondary | ICD-10-CM | POA: Diagnosis not present

## 2017-07-02 DIAGNOSIS — S72102D Unspecified trochanteric fracture of left femur, subsequent encounter for closed fracture with routine healing: Secondary | ICD-10-CM | POA: Diagnosis not present

## 2017-07-02 DIAGNOSIS — F0390 Unspecified dementia without behavioral disturbance: Secondary | ICD-10-CM | POA: Diagnosis not present

## 2017-07-02 DIAGNOSIS — S72142A Displaced intertrochanteric fracture of left femur, initial encounter for closed fracture: Secondary | ICD-10-CM

## 2017-07-02 NOTE — Progress Notes (Signed)
Alexander Goodwin returns today Alexander Goodwin days status post IM nailing of the left hip intertrochanteric fracture.  Overall doing well.  His pain is subsiding.  He is here mainly today for just a wound check and removal of his staples.  He has had no fevers chills or shortness of breath physical exam  Left hip he is able to bear weight.  Surgical incisions are well approximated with staples no signs of infection.  Left calf supple nontender.  Impression 19 days status post left hip IM nailing intertrochanteric fracture  Plan: He is weightbearing as tolerated left hip.  Continue to work with therapy for range of motion strengthening.  Staples were removed today Steri-Strips applied.  He is able to get incision wet in shower.  Questions encouraged and answered by the patient and his son is present today Follow-up in 1 month AP lateral views of the left hip at that time

## 2017-07-16 DIAGNOSIS — H4010X0 Unspecified open-angle glaucoma, stage unspecified: Secondary | ICD-10-CM | POA: Diagnosis not present

## 2017-07-16 DIAGNOSIS — I10 Essential (primary) hypertension: Secondary | ICD-10-CM | POA: Diagnosis not present

## 2017-07-16 DIAGNOSIS — K219 Gastro-esophageal reflux disease without esophagitis: Secondary | ICD-10-CM | POA: Diagnosis not present

## 2017-07-16 DIAGNOSIS — F0150 Vascular dementia without behavioral disturbance: Secondary | ICD-10-CM | POA: Diagnosis not present

## 2017-07-16 DIAGNOSIS — G4700 Insomnia, unspecified: Secondary | ICD-10-CM | POA: Diagnosis not present

## 2017-07-16 DIAGNOSIS — E785 Hyperlipidemia, unspecified: Secondary | ICD-10-CM | POA: Diagnosis not present

## 2017-07-16 DIAGNOSIS — R339 Retention of urine, unspecified: Secondary | ICD-10-CM | POA: Diagnosis not present

## 2017-07-16 DIAGNOSIS — D509 Iron deficiency anemia, unspecified: Secondary | ICD-10-CM | POA: Diagnosis not present

## 2017-07-16 DIAGNOSIS — M810 Age-related osteoporosis without current pathological fracture: Secondary | ICD-10-CM | POA: Diagnosis not present

## 2017-07-31 ENCOUNTER — Ambulatory Visit (INDEPENDENT_AMBULATORY_CARE_PROVIDER_SITE_OTHER): Payer: No Typology Code available for payment source

## 2017-07-31 ENCOUNTER — Ambulatory Visit (INDEPENDENT_AMBULATORY_CARE_PROVIDER_SITE_OTHER): Payer: No Typology Code available for payment source | Admitting: Orthopaedic Surgery

## 2017-07-31 ENCOUNTER — Encounter (INDEPENDENT_AMBULATORY_CARE_PROVIDER_SITE_OTHER): Payer: Self-pay | Admitting: Orthopaedic Surgery

## 2017-07-31 DIAGNOSIS — S72142A Displaced intertrochanteric fracture of left femur, initial encounter for closed fracture: Secondary | ICD-10-CM

## 2017-07-31 DIAGNOSIS — M25552 Pain in left hip: Secondary | ICD-10-CM | POA: Diagnosis not present

## 2017-07-31 NOTE — Progress Notes (Signed)
The patient is a 83 year old gentleman who is 45 days status post surgical fixation of a displaced left hip intertrochanteric fracture.  He stays at [facility] facility.  He reports some postoperative hip pain.  He is mainly wheelchair-bound.  On exam I am able to put his left hip easily through internal or external rotation he does not react to this and says that it does not hurt.  He is able to move his foot up as well.  X-rays of the left hip are obtained and show intact hardware from surgical fixation of a nondisplaced trochanteric fracture on the left side.  We  reviewed his preoperative and intraoperative films as well.  At this point he is doing well enough that he can follow-up as needed.  Certainly at his age of 65 is a significant fall risk and should only be up with assistance.  All questions and concerns were answered and addressed.  Follow-up will be as needed.

## 2017-12-24 ENCOUNTER — Non-Acute Institutional Stay: Payer: Medicare Other | Admitting: Hospice and Palliative Medicine

## 2017-12-24 DIAGNOSIS — Z515 Encounter for palliative care: Secondary | ICD-10-CM

## 2017-12-24 NOTE — Progress Notes (Signed)
PALLIATIVE CARE CONSULT VISIT   PATIENT NAME: Alexander Goodwin DOB: 1921/08/07 MRN: [id number]  PRIMARY CARE PROVIDER: Delle Reining, NP (Onsite)  REFERRING PROVIDER: Delle Reining, NP  RESPONSIBLE PARTY:   Sons [name] and Roger   RECOMMENDATIONS and PLAN:  1. Weakness: secondary to advanced age of 84, vascular dementia approximately 7a on the FAST scale, previous Hip fracture with SNF rehab, chronic pulmonary aspiration, poor vision prohibiting him from seeing his food and eating well. Weight loss of about 5 lbs since January. He resides in memory care at [facility]. He can answer short answer questions. He is aware that he cannot remember things and is able to acknowledge his name and reports, "what's left of me." Staff report that he often cries and reports that he never knew he would "be like this." He can feed himself finger foods with encouragement and supervision from staff. Recommend to follow all recommendations by OT/ST. Have left a VM for son to schedule an appointment to meet and discuss goals of care and disease trajectory.  2. High fall risk: he tries to stand on his own. He has an alarm on his chair. Recommend close observation and possibly sitters in near future to provide 1 on 1 care. Staff report that he has poor vision and leg weakness but can be transferred with assist. 3.ACP: DNR form on chart. As above...reaching to sons to discuss goals of care.   I spent 30 minutes providing this consultation,  from 12:15 pm to 12:45 pm. More than 50% of the time in this consultation was spent interviewing staff, reviewing medical records and  coordinating communication.   HISTORY OF PRESENT ILLNESS:  Alexander Goodwin is a 84 y.o.  male with multiple medical problems. Palliative Care was asked to help address symptom management and goals of care.   CODE STATUS: DNR  PPS: 30% to weak 40% HOSPICE ELIGIBILITY/DIAGNOSIS: vascular dementia TBD  PAST MEDICAL HISTORY:  Past Medical History:    Diagnosis Date  . Abscess of lung without pneumonia ([place])    Right upper lobe chronic lung abscess and prior left upper lobe lung abscess due to chronic aspiration  . Arthritis    right knee, hips, neck. He denies any major limitation in activities  . BPH (benign prostatic hypertrophy) with urinary obstruction   . Dementia   . Diverticulitis of colon   . Dysphagia, oropharyngeal   . Esophagitis    Last EGD Fall '11   . GERD (gastroesophageal reflux disease)    well controlled with medication. No nocturnal symptoms on a regular basis  . Glaucoma    both eyes, followed closely by Dr. Janyth Contes  . History of colon polyps   . Hyperlipemia   . Hypertension   . Pancreatitis    resolved after GB surgery  . Seasonal allergies   . Stroke [facility])    TIAs - age 19    SOCIAL HX:  Social History   Tobacco Use  . Smoking status: Former Smoker    Packs/day: 0.50    Years: 30.00    Pack years: 15.00    Types: Pipe, Cigars    Last attempt to quit: 08/20/1978    Years since quitting: 39.3  . Smokeless tobacco: Former Systems developer    Types: [place] date: 11/16/1990  Substance Use Topics  . Alcohol use: No   Patient is widowed. He was Engineer, building services for [employer]. He has three sons who are actively involved in  his care.  ALLERGIES:  Allergies  Allergen Reactions  . Clindamycin/Lincomycin Itching  . Erythromycin Base Rash     PERTINENT MEDICATIONS:  Reviewed.  PHYSICAL EXAM:  May vitals: WT 152.8#, 98, 88, 20, 118/67 General: Elderly, Caucasian male sitting in WC falling asleep at the table. Frail Cardiovascular: did not auscultate Pulmonary: breathing easily without cough Extremities: weak upon standing Skin: fragile, thin and easily bruised Neurological: arouses easily; answers simple questions; +generalized weakness Mood: gracious and polite  Nathanial Rancher, NP

## 2017-12-28 ENCOUNTER — Emergency Department (HOSPITAL_COMMUNITY)
Admission: EM | Admit: 2017-12-28 | Discharge: 2017-12-29 | Disposition: A | Discharge status: Skilled Nursing Facility | Payer: Medicare Other | Attending: Emergency Medicine | Admitting: Emergency Medicine

## 2017-12-28 ENCOUNTER — Emergency Department (HOSPITAL_COMMUNITY): Payer: Medicare Other

## 2017-12-28 DIAGNOSIS — Y939 Activity, unspecified: Secondary | ICD-10-CM | POA: Insufficient documentation

## 2017-12-28 DIAGNOSIS — S0990XA Unspecified injury of head, initial encounter: Secondary | ICD-10-CM

## 2017-12-28 DIAGNOSIS — W050XXA Fall from non-moving wheelchair, initial encounter: Secondary | ICD-10-CM | POA: Diagnosis not present

## 2017-12-28 DIAGNOSIS — Y999 Unspecified external cause status: Secondary | ICD-10-CM | POA: Diagnosis not present

## 2017-12-28 DIAGNOSIS — I10 Essential (primary) hypertension: Secondary | ICD-10-CM | POA: Insufficient documentation

## 2017-12-28 DIAGNOSIS — Z79899 Other long term (current) drug therapy: Secondary | ICD-10-CM | POA: Insufficient documentation

## 2017-12-28 DIAGNOSIS — Z87891 Personal history of nicotine dependence: Secondary | ICD-10-CM | POA: Diagnosis not present

## 2017-12-28 DIAGNOSIS — F0390 Unspecified dementia without behavioral disturbance: Secondary | ICD-10-CM | POA: Insufficient documentation

## 2017-12-28 DIAGNOSIS — W19XXXA Unspecified fall, initial encounter: Secondary | ICD-10-CM

## 2017-12-28 DIAGNOSIS — Z043 Encounter for examination and observation following other accident: Secondary | ICD-10-CM | POA: Diagnosis present

## 2017-12-28 DIAGNOSIS — Y92129 Unspecified place in nursing home as the place of occurrence of the external cause: Secondary | ICD-10-CM | POA: Diagnosis not present

## 2017-12-28 DIAGNOSIS — S0083XA Contusion of other part of head, initial encounter: Secondary | ICD-10-CM | POA: Insufficient documentation

## 2017-12-28 NOTE — ED Notes (Signed)
Bed: WA01 Expected date:  Expected time:  Means of arrival:  Comments: 84 yo F/Fall

## 2017-12-28 NOTE — ED Triage Notes (Signed)
Pt to ED Via [facility], with complaints of a witnessed fall out of his wheelchair forward with a hematoma on the right side of forehead above right eyebrow. Pt is alert x 1 which is his baseline.   Vitals 180/86 75 pulse 16 RR 97% RA  CBG 144

## 2017-12-28 NOTE — ED Provider Notes (Signed)
Medical screening examination/treatment/procedure(s) were conducted as a shared visit with non-physician practitioner(s) and myself.  I personally evaluated the patient during the encounter.  None 84 year old male here after unwitnessed fall.  Has history of dementia.  On exam he is at baseline.  Awaiting results of head and neck CT.   Lacretia Leigh, MD 12/28/17 [phone]

## 2017-12-28 NOTE — Discharge Instructions (Addendum)
CT findings with patient family.  Will need to follow-up with primary care.  Return to ED with any worsening symptoms.

## 2017-12-29 NOTE — ED Provider Notes (Signed)
[facility] DEPT Provider Note   CSN: [id number] Arrival date & time: 12/28/17  2032     History   Chief Complaint Chief Complaint  Patient presents with  . Head Injury  . Fall    HPI Alexander Goodwin is a 84 y.o. male.  HPI 84 year old male past medical history significant for dementia, GERD, hypertension, chronic lung abscess, bilateral glaucoma followed by ophthalmology that presents to the emergency department today with son at bedside by EMS from facility for mechanical fall.  Patient had a witnessed fall out of the wheelchair and fell forward.  Struck head on the ground.  Denies any LOC.  Patient denies any pain.  He is currently at his baseline per son at bedside.  Patient is not on blood thinners.  Reports that patient has to be have to person assist with ambulation.  Uses a wheelchair at baseline.  Patient is a DNR.  Patient has no complaints at this time. Past Medical History:  Diagnosis Date  . Abscess of lung without pneumonia ([place])    Right upper lobe chronic lung abscess and prior left upper lobe lung abscess due to chronic aspiration  . Arthritis    right knee, hips, neck. He denies any major limitation in activities  . BPH (benign prostatic hypertrophy) with urinary obstruction   . Dementia   . Diverticulitis of colon   . Dysphagia, oropharyngeal   . Esophagitis    Last EGD Fall '11   . GERD (gastroesophageal reflux disease)    well controlled with medication. No nocturnal symptoms on a regular basis  . Glaucoma    both eyes, followed closely by Dr. Janyth Contes  . History of colon polyps   . Hyperlipemia   . Hypertension   . Pancreatitis    resolved after GB surgery  . Seasonal allergies   . Stroke [facility])    TIAs - age 68    Patient Active Problem List   Diagnosis Date Noted  . Hip fracture ([place]) 06/16/2017  . Anemia 06/16/2017  . Bradycardia 06/16/2017  . Chronic pulmonary aspiration 06/16/2017  . Displaced  intertrochanteric fracture of left femur, init ([place]) 06/16/2017  . Grief 07/19/2016  . Dementia, vascular 06/15/2015  . DNR (do not resuscitate) 09/15/2014  . Branch retinal vein occlusion 09/02/2014  . Cloudy posterior capsule 04/10/2013  . Pseudoaphakia 04/10/2013  . 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 Primary open angle glaucoma 12/06/2011  . Hyperlipemia   . Hypertension   . History of colon polyps   . Arthritis   . GERD (gastroesophageal reflux disease)   . Glaucoma   . History of esophagitis   . History of TIA (transient ischemic attack)   . BPH (benign prostatic hyperplasia)     Past Surgical History:  Procedure Laterality Date  . APPENDECTOMY     '88 along with [place]  . CHOLECYSTECTOMY     '88 - laparotomy ([place])  . INTRAMEDULLARY (IM) NAIL INTERTROCHANTERIC Left 06/16/2017   Procedure: INTRAMEDULLARY (IM) NAIL INTERTROCHANTRIC LEFT HIP;  Surgeon: Mcarthur Rossetti, MD;  Location: [facility];  Service: Orthopedics;  Laterality: Left;  [facility] VIDEO BRONCHOSCOPY  03/26/2012   Procedure: VIDEO BRONCHOSCOPY WITH FLUORO;  Surgeon: Elsie Stain, MD;  Location: [facility];  Service: Cardiopulmonary;  Laterality: Bilateral;        Home Medications    Prior to Admission medications  Medication Sig Start Date End Date Taking? Authorizing Provider  acidophilus (RISAQUAD) CAPS capsule Take 1 capsule by mouth daily.   Yes [provider]  aspirin EC 325 MG EC tablet Take 1 tablet (325 mg total) by mouth daily with breakfast. 06/19/17  Yes Mcarthur Rossetti, MD  brimonidine (ALPHAGAN P) 0.1 % SOLN Place 1 drop into both eyes 3 (three) times daily.    Yes [provider]  brinzolamide (AZOPT) 1 % ophthalmic suspension Place 1 drop into both eyes 3 (three) times daily.    Yes [provider]  calcium-vitamin D (OSCAL 500/200 D-3) 500-200 MG-UNIT per  tablet Take 1 tablet by mouth daily.    Yes [provider]  cholecalciferol (VITAMIN D) 1000 units tablet Take 1,000 Units by mouth daily.   Yes [provider]  docusate sodium (COLACE) 100 MG capsule Take 1 capsule (100 mg total) by mouth 2 (two) times daily. 06/19/17  Yes Debbe Odea, MD  ferrous gluconate (IRON 27) 240 (27 FE) MG tablet Take 240 mg by mouth daily.   Yes [provider]  latanoprost (XALATAN) 0.005 % ophthalmic solution Place 1 drop into both eyes at bedtime.    Yes [provider]  loratadine (CLARITIN) 10 MG tablet Take 10 mg by mouth daily.   Yes [provider]  Melatonin 5 MG TABS Take 1 tablet by mouth at bedtime.   Yes [provider]  memantine (NAMENDA) 5 MG tablet Take 5 mg by mouth 2 (two) times daily.   Yes [provider]  ondansetron (ZOFRAN) 4 MG tablet Take 4 mg by mouth every 8 (eight) hours as needed for nausea or vomiting.   Yes [provider]  pantoprazole (PROTONIX) 40 MG tablet Take 40 mg by mouth daily.   Yes [provider]  sertraline (ZOLOFT) 50 MG tablet Take 50 mg by mouth daily.   Yes [provider]  Skin Protectants, Misc. (EUCERIN) cream Apply 1 application topically daily as needed for dry skin.   Yes [provider]  sucralfate (CARAFATE) 1 G tablet Take 1 tablet by mouth daily.  01/17/15  Yes [provider]  trolamine salicylate (ASPERCREME/ALOE) 10 % cream Apply 1 application topically 2 (two) times daily.   Yes [provider]  acetaminophen (TYLENOL) 325 MG tablet Take 2 tablets (650 mg total) by mouth every 6 (six) hours as needed for mild pain (or Fever >/= 101). 06/19/17   Mcarthur Rossetti, MD  HYDROcodone-acetaminophen (NORCO/VICODIN) 5-325 MG tablet Take 1 tablet every 6 (six) hours as needed by mouth for moderate pain. Patient not taking: Reported on 12/28/2017 06/24/17   Pete Pelt, PA-C  OLANZapine zydis  (ZYPREXA) 5 MG disintegrating tablet Take 0.5 tablets (2.5 mg total) by mouth daily as needed (severe agitation). Patient not taking: Reported on 12/28/2017 06/19/17   Debbe Odea, MD  QUEtiapine (SEROQUEL) 25 MG tablet Take 2 tablets (50 mg total) by mouth daily at 6 PM. Patient not taking: Reported on 12/28/2017 06/19/17   Debbe Odea, MD  Skin Protectants, Misc. (MINERIN) CREA Apply 1 application topically daily as needed (to both feet for dry skin).    [provider]  tamsulosin (FLOMAX) 0.4 MG CAPS capsule Take 1 capsule (0.4 mg total) by mouth daily after supper. Patient not taking: Reported on 12/28/2017 06/19/17   Debbe Odea, MD    Family History Family History  Problem Relation Age of Onset  . Diabetes Mother   . Hypertension  Mother   . Heart disease Mother   . Stroke Father   . Heart disease Father   . Diabetes Father   . Rheum arthritis Father   . Arthritis Father   . Cancer Sister        uterine cancer  . COPD Brother   . Hypertension Brother   . Cancer Brother        colon. brain, and hip  . Diabetes Brother   . Cancer Brother   . Diabetes Brother   . Cancer Sister        gyn malignancy  . Cancer Sister        pancreatic cancer  . Allergies Sister   . Allergies Son   . Asthma Sister   . Asthma Son   . Diabetes Maternal Grandmother   . Diabetes Maternal Grandfather   . Diabetes Paternal Grandmother   . Diabetes Paternal Grandfather     Social History Social History   Tobacco Use  . Smoking status: Former Smoker    Packs/day: 0.50    Years: 30.00    Pack years: 15.00    Types: Pipe, Cigars    Last attempt to quit: 08/20/1978    Years since quitting: 39.3  . Smokeless tobacco: Former Systems developer    Types: [place] date: 11/16/1990  Substance Use Topics  . Alcohol use: No  . Drug use: No     Allergies   Clindamycin/lincomycin and Erythromycin base   Review of Systems Review of Systems  Constitutional: Negative for chills and fever.   Respiratory: Negative for cough.   Gastrointestinal: Negative for vomiting.  Musculoskeletal: Negative for arthralgias, back pain and neck pain.  Skin: Positive for color change and wound.  Neurological: Negative for headaches.     Physical Exam Updated Vital Signs BP (!) 180/74 (BP Location: Right Arm)   Pulse 67   Temp 98.6 F (37 C) (Oral)   Resp 17   Ht 5\' 10"  (1.778 m)   Wt 77.1 kg (170 lb)   SpO2 96%   BMI 24.39 kg/m   Physical Exam  Constitutional: He is oriented to person, place, and time. He appears well-developed and well-nourished. No distress.  HENT:  Head: Normocephalic and atraumatic.  No bilateral hemotympanum.  No septal hematoma.  Hematoma to the right forehead.  Skull depression noted.  Eyes: Pupils are equal, round, and reactive to light. EOM are normal. Right eye exhibits no discharge. Left eye exhibits no discharge. No scleral icterus.  Neck: Normal range of motion. Neck supple.  No c spine midline tenderness. No paraspinal tenderness. No deformities or step offs noted. Full ROM. Supple. No nuchal rigidity.    Cardiovascular: Normal rate, regular rhythm, normal heart sounds and intact distal pulses. Exam reveals no gallop and no friction rub.  No murmur heard. Pulmonary/Chest: Effort normal and breath sounds normal. No stridor. No respiratory distress. He has no wheezes. He has no rales. He exhibits no tenderness.  Musculoskeletal: Normal range of motion.  No midline T spine or L spine tenderness. No deformities or step offs noted. Full ROM. Pelvis is stable.   Neurological: He is alert and oriented to person, place, and time.  Cranial nerves II through XII grossly intact.  Grip strength is equal bilaterally.  No drift noted.  Follows commands properly.  Oriented to person but not place and time which is baseline per family at bedside.  Skin: Skin is warm and dry. Capillary refill takes less than  2 seconds. No pallor.  Psychiatric: His behavior is normal.  Judgment and thought content normal.  Nursing note and vitals reviewed.    ED Treatments / Results  Labs (all labs ordered are listed, but only abnormal results are displayed) Labs Reviewed - No data to display  EKG None  Radiology Ct Head Wo Contrast  Result Date: 12/28/2017 CLINICAL DATA:  Witnessed fall from wheelchair. RIGHT forehead hematoma. History of dementia. EXAM: CT HEAD WITHOUT CONTRAST CT CERVICAL SPINE WITHOUT CONTRAST TECHNIQUE: Multidetector CT imaging of the head and cervical spine was performed following the standard protocol without intravenous contrast. Multiplanar CT image reconstructions of the cervical spine were also generated. COMPARISON:  CT HEAD and cervical spine June 16, 2017 FINDINGS: CT HEAD FINDINGS BRAIN: No intraparenchymal hemorrhage, mass effect nor midline shift. Moderate to severe parenchymal brain volume loss. No hydrocephalus. Patchy supratentorial white matter hypodensities within normal range for patient's age, though non-specific are most compatible with chronic small vessel ischemic disease. No acute large vascular territory infarcts. No abnormal extra-axial fluid collections. Basal cisterns are patent. VASCULAR: Moderate calcific atherosclerosis of the carotid siphons. SKULL: No skull fracture. Small RIGHT frontal scalp hematoma without subcutaneous gas or radiopaque foreign bodies. SINUSES/ORBITS: Paranasal sinus mucosal thickening with LEFT frontal sinus air-fluid level. Mastoid air cells are well aerated. Included ocular globes and orbital contents are non-suspicious. Status post bilateral ocular lens implants. OTHER: None. CT CERVICAL SPINE FINDINGS ALIGNMENT: Straightened lordosis.  Vertebral bodies in alignment. SKULL BASE AND VERTEBRAE: Cervical vertebral bodies intact. Ankylosis of the upper cervical vertebral bodies with severe disc height loss, endplate sclerosis and marginal spurring consistent with degenerative discs lower cervical vertebral  bodies. C1-2 articulation maintained. Osteopenia without destructive bony lesions. Mild likely old T4 superior endplate compression fracture. SOFT TISSUES AND SPINAL CANAL: Nonacute. Moderate calcific atherosclerosis carotid bifurcations. DISC LEVELS:  No significant canal stenosis. UPPER CHEST: Irregular consolidation bilateral upper lobes of the lungs. OTHER: None. IMPRESSION: CT HEAD: 1. No acute intracranial process. Small RIGHT frontal scalp hematoma. No skull fracture. 2. Stable moderate to severe parenchymal brain volume loss and moderate chronic small vessel ischemic disease. CT CERVICAL SPINE: 1. No acute cervical spine fracture or malalignment. Mild likely old T4 compression fracture. 2. Irregular bilateral upper lobe lung consolidations would be better characterized on CT chest as clinically indicated. Electronically Signed   By: Elon Alas M.D.   On: 12/28/2017 23:45   Ct Cervical Spine Wo Contrast  Result Date: 12/28/2017 CLINICAL DATA:  Witnessed fall from wheelchair. RIGHT forehead hematoma. History of dementia. EXAM: CT HEAD WITHOUT CONTRAST CT CERVICAL SPINE WITHOUT CONTRAST TECHNIQUE: Multidetector CT imaging of the head and cervical spine was performed following the standard protocol without intravenous contrast. Multiplanar CT image reconstructions of the cervical spine were also generated. COMPARISON:  CT HEAD and cervical spine June 16, 2017 FINDINGS: CT HEAD FINDINGS BRAIN: No intraparenchymal hemorrhage, mass effect nor midline shift. Moderate to severe parenchymal brain volume loss. No hydrocephalus. Patchy supratentorial white matter hypodensities within normal range for patient's age, though non-specific are most compatible with chronic small vessel ischemic disease. No acute large vascular territory infarcts. No abnormal extra-axial fluid collections. Basal cisterns are patent. VASCULAR: Moderate calcific atherosclerosis of the carotid siphons. SKULL: No skull fracture.  Small RIGHT frontal scalp hematoma without subcutaneous gas or radiopaque foreign bodies. SINUSES/ORBITS: Paranasal sinus mucosal thickening with LEFT frontal sinus air-fluid level. Mastoid air cells are well aerated. Included ocular globes and orbital contents are non-suspicious. Status post bilateral ocular  lens implants. OTHER: None. CT CERVICAL SPINE FINDINGS ALIGNMENT: Straightened lordosis.  Vertebral bodies in alignment. SKULL BASE AND VERTEBRAE: Cervical vertebral bodies intact. Ankylosis of the upper cervical vertebral bodies with severe disc height loss, endplate sclerosis and marginal spurring consistent with degenerative discs lower cervical vertebral bodies. C1-2 articulation maintained. Osteopenia without destructive bony lesions. Mild likely old T4 superior endplate compression fracture. SOFT TISSUES AND SPINAL CANAL: Nonacute. Moderate calcific atherosclerosis carotid bifurcations. DISC LEVELS:  No significant canal stenosis. UPPER CHEST: Irregular consolidation bilateral upper lobes of the lungs. OTHER: None. IMPRESSION: CT HEAD: 1. No acute intracranial process. Small RIGHT frontal scalp hematoma. No skull fracture. 2. Stable moderate to severe parenchymal brain volume loss and moderate chronic small vessel ischemic disease. CT CERVICAL SPINE: 1. No acute cervical spine fracture or malalignment. Mild likely old T4 compression fracture. 2. Irregular bilateral upper lobe lung consolidations would be better characterized on CT chest as clinically indicated. Electronically Signed   By: Elon Alas M.D.   On: 12/28/2017 23:45    Procedures Procedures (including critical care time)  Medications Ordered in ED Medications - No data to display   Initial Impression / Assessment and Plan / ED Course  I have reviewed the triage vital signs and the nursing notes.  Pertinent labs & imaging results that were available during my care of the patient were reviewed by me and considered in my  medical decision making (see chart for details).     Patient presents to the ED for mechanical fall.  Patient not on blood thinners.  No LOC.  History of falls and dementia.  At baseline per family.  Imaging reassuring.  Does show bilateral upper lobe consolidations however family reports he has known chronic fungal lung abscesses and is followed by pulmonology without any intervention.  Otherwise CT scan shows no acute findings.  Patient has no signs of intrathoracic or intra-abdominal trauma.  Neurovascular intact in all extremities without any focal neuro deficit on exam.  Patient at baseline.  He is hypertensive with history of hypertension and not take any blood pressure medicine this evening.  Stable for discharge back to facility with follow-up as needed.  Family is agreeable to this plan.  Patient seen by my attending who is also agreeable.  Final Clinical Impressions(s) / ED Diagnoses   Final diagnoses:  Injury of head, initial encounter  Fall, initial encounter    ED Discharge Orders    None       Doristine Devoid, PA-C 12/29/17 0013

## 2018-01-09 ENCOUNTER — Telehealth: Payer: Self-pay

## 2018-01-09 NOTE — Telephone Encounter (Signed)
Phone call placed to patient's son to offer to schedule a visit to meet with Palliative care NP. VM left.

## 2018-01-14 ENCOUNTER — Telehealth: Payer: Self-pay

## 2018-01-14 NOTE — Telephone Encounter (Signed)
Received call from patient's son, Alexander Goodwin,  to schedule meeting with Rodena Piety NP. Alexander Goodwin to check with brother and return call

## 2018-01-19 ENCOUNTER — Other Ambulatory Visit: Payer: Self-pay

## 2018-01-19 ENCOUNTER — Encounter (HOSPITAL_COMMUNITY): Payer: Self-pay | Admitting: *Deleted

## 2018-01-19 ENCOUNTER — Emergency Department (HOSPITAL_COMMUNITY)
Admission: EM | Admit: 2018-01-19 | Discharge: 2018-01-20 | Disposition: A | Discharge status: Skilled Nursing Facility | Payer: Medicare Other | Attending: Emergency Medicine | Admitting: Emergency Medicine

## 2018-01-19 DIAGNOSIS — F0390 Unspecified dementia without behavioral disturbance: Secondary | ICD-10-CM | POA: Insufficient documentation

## 2018-01-19 DIAGNOSIS — Z7982 Long term (current) use of aspirin: Secondary | ICD-10-CM | POA: Insufficient documentation

## 2018-01-19 DIAGNOSIS — Y939 Activity, unspecified: Secondary | ICD-10-CM | POA: Diagnosis not present

## 2018-01-19 DIAGNOSIS — W06XXXA Fall from bed, initial encounter: Secondary | ICD-10-CM

## 2018-01-19 DIAGNOSIS — Y92122 Bedroom in nursing home as the place of occurrence of the external cause: Secondary | ICD-10-CM | POA: Insufficient documentation

## 2018-01-19 DIAGNOSIS — I10 Essential (primary) hypertension: Secondary | ICD-10-CM | POA: Insufficient documentation

## 2018-01-19 DIAGNOSIS — S51001A Unspecified open wound of right elbow, initial encounter: Secondary | ICD-10-CM | POA: Insufficient documentation

## 2018-01-19 DIAGNOSIS — Y999 Unspecified external cause status: Secondary | ICD-10-CM | POA: Insufficient documentation

## 2018-01-19 DIAGNOSIS — Z87891 Personal history of nicotine dependence: Secondary | ICD-10-CM | POA: Insufficient documentation

## 2018-01-19 DIAGNOSIS — Z8659 Personal history of other mental and behavioral disorders: Secondary | ICD-10-CM

## 2018-01-19 DIAGNOSIS — Z79899 Other long term (current) drug therapy: Secondary | ICD-10-CM | POA: Diagnosis not present

## 2018-01-19 DIAGNOSIS — S59901A Unspecified injury of right elbow, initial encounter: Secondary | ICD-10-CM | POA: Diagnosis present

## 2018-01-19 DIAGNOSIS — S51011A Laceration without foreign body of right elbow, initial encounter: Secondary | ICD-10-CM

## 2018-01-19 MED ORDER — BACITRACIN ZINC 500 UNIT/GM EX OINT
TOPICAL_OINTMENT | Freq: Once | CUTANEOUS | Status: AC
  Administered 2018-01-19: 1 via TOPICAL
  Filled 2018-01-19: qty 1.8

## 2018-01-19 NOTE — ED Triage Notes (Signed)
Pt bib EMS and presents after an unwitnessed fall. Staff found pt beside his bed on the floor. Initially pt reports right hip pain but now only reports pain on his right elbow where he has a skin tear. Staff reports pt's mentation is baseline.  Pt hx dementia and a/o x 2.  Pt is hard of hearing and is very tired. EMS did not note any serious obvious injuries. Pt has a DNR.  EMS VS: BP: 196/94, HR: 68, 96%RA CBG: 116

## 2018-01-19 NOTE — Discharge Instructions (Addendum)
It was our pleasure to provide your ER care today - we hope that you feel better.  Fall precautions.   Keep wound very clean.   Return to ER if worse, new symptoms, other medical emergency.

## 2018-01-19 NOTE — ED Notes (Signed)
Bed: SL37 Expected date:  Expected time:  Means of arrival:  Comments: 84 yo M/ Fall

## 2018-01-19 NOTE — ED Provider Notes (Signed)
[facility] DEPT Provider Note   CSN: [id number] Arrival date & time: 01/19/18  2205     History   Chief Complaint Chief Complaint  Patient presents with  . Fall    HPI Alexander Goodwin is a 84 y.o. male.  Patient presents via ems s/p falling from bed this PM at [facility]. Pt with hx dementia. pts mental status reported consistent with baseline. No loc noted. Pt with skin tear to right elbow. Pt denies other pain. Limited historian - level 5 caveat.   The history is provided by the patient and the EMS personnel. The history is limited by the condition of the patient.  Fall     Past Medical History:  Diagnosis Date  . Abscess of lung without pneumonia ([place])    Right upper lobe chronic lung abscess and prior left upper lobe lung abscess due to chronic aspiration  . Arthritis    right knee, hips, neck. He denies any major limitation in activities  . BPH (benign prostatic hypertrophy) with urinary obstruction   . Dementia   . Diverticulitis of colon   . Dysphagia, oropharyngeal   . Esophagitis    Last EGD Fall '11   . GERD (gastroesophageal reflux disease)    well controlled with medication. No nocturnal symptoms on a regular basis  . Glaucoma    both eyes, followed closely by Dr. Janyth Contes  . History of colon polyps   . Hyperlipemia   . Hypertension   . Pancreatitis    resolved after GB surgery  . Seasonal allergies   . Stroke [facility])    TIAs - age 44    Patient Active Problem List   Diagnosis Date Noted  . Hip fracture ([place]) 06/16/2017  . Anemia 06/16/2017  . Bradycardia 06/16/2017  . Chronic pulmonary aspiration 06/16/2017  . Displaced intertrochanteric fracture of left femur, init ([place]) 06/16/2017  . Grief 07/19/2016  . Dementia, vascular 06/15/2015  . DNR (do not resuscitate) 09/15/2014  . Branch retinal vein occlusion 09/02/2014  . Cloudy posterior capsule 04/10/2013  . Pseudoaphakia 04/10/2013  . History of lung abscess now with  chronic cavitation right upper lobe secondary to chronic aspiration 03/14/2012  . Primary open angle glaucoma 12/06/2011  . Hyperlipemia   . Hypertension   . History of colon polyps   . Arthritis   . GERD (gastroesophageal reflux disease)   . Glaucoma   . History of esophagitis   . History of TIA (transient ischemic attack)   . BPH (benign prostatic hyperplasia)     Past Surgical History:  Procedure Laterality Date  . APPENDECTOMY     '88 along with [place]  . CHOLECYSTECTOMY     '88 - laparotomy ([place])  . INTRAMEDULLARY (IM) NAIL INTERTROCHANTERIC Left 06/16/2017   Procedure: INTRAMEDULLARY (IM) NAIL INTERTROCHANTRIC LEFT HIP;  Surgeon: Mcarthur Rossetti, MD;  Location: [facility];  Service: Orthopedics;  Laterality: Left;  Marland Kitchen VIDEO BRONCHOSCOPY  03/26/2012   Procedure: VIDEO BRONCHOSCOPY WITH FLUORO;  Surgeon: Elsie Stain, MD;  Location: [facility];  Service: Cardiopulmonary;  Laterality: Bilateral;        Home Medications    Prior to Admission medications   Medication Sig Start Date End Date Taking? Authorizing Provider  acetaminophen (TYLENOL) 325 MG tablet Take 2 tablets (650 mg total) by mouth every 6 (six) hours as needed for mild pain (or Fever >/= 101). 06/19/17   Mcarthur Rossetti, MD  acidophilus (RISAQUAD) CAPS  capsule Take 1 capsule by mouth daily.    [provider]  aspirin EC 325 MG EC tablet Take 1 tablet (325 mg total) by mouth daily with breakfast. 06/19/17   Mcarthur Rossetti, MD  brimonidine (ALPHAGAN P) 0.1 % SOLN Place 1 drop into both eyes 3 (three) times daily.     [provider]  brinzolamide (AZOPT) 1 % ophthalmic suspension Place 1 drop into both eyes 3 (three) times daily.     [provider]  calcium-vitamin D (OSCAL 500/200 D-3) 500-200 MG-UNIT per tablet Take 1 tablet by mouth daily.     [provider]  cholecalciferol (VITAMIN D) 1000 units tablet Take 1,000 Units by mouth  daily.    [provider]  docusate sodium (COLACE) 100 MG capsule Take 1 capsule (100 mg total) by mouth 2 (two) times daily. 06/19/17   Debbe Odea, MD  ferrous gluconate (IRON 27) 240 (27 FE) MG tablet Take 240 mg by mouth daily.    [provider]  HYDROcodone-acetaminophen (NORCO/VICODIN) 5-325 MG tablet Take 1 tablet every 6 (six) hours as needed by mouth for moderate pain. Patient not taking: Reported on 12/28/2017 06/24/17   Pete Pelt, PA-C  latanoprost (XALATAN) 0.005 % ophthalmic solution Place 1 drop into both eyes at bedtime.     [provider]  loratadine (CLARITIN) 10 MG tablet Take 10 mg by mouth daily.    [provider]  Melatonin 5 MG TABS Take 1 tablet by mouth at bedtime.    [provider]  memantine (NAMENDA) 5 MG tablet Take 5 mg by mouth 2 (two) times daily.    [provider]  OLANZapine zydis (ZYPREXA) 5 MG disintegrating tablet Take 0.5 tablets (2.5 mg total) by mouth daily as needed (severe agitation). Patient not taking: Reported on 12/28/2017 06/19/17   Debbe Odea, MD  ondansetron (ZOFRAN) 4 MG tablet Take 4 mg by mouth every 8 (eight) hours as needed for nausea or vomiting.    [provider]  pantoprazole (PROTONIX) 40 MG tablet Take 40 mg by mouth daily.    [provider]  QUEtiapine (SEROQUEL) 25 MG tablet Take 2 tablets (50 mg total) by mouth daily at 6 PM. Patient not taking: Reported on 12/28/2017 06/19/17   Debbe Odea, MD  sertraline (ZOLOFT) 50 MG tablet Take 50 mg by mouth daily.    [provider]  Skin Protectants, Misc. (EUCERIN) cream Apply 1 application topically daily as needed for dry skin.    [provider]  Skin Protectants, Misc. (MINERIN) CREA Apply 1 application topically daily as needed (to both feet for dry skin).    [provider]  sucralfate (CARAFATE) 1 G tablet Take 1 tablet by mouth daily.  01/17/15   [provider]    tamsulosin (FLOMAX) 0.4 MG CAPS capsule Take 1 capsule (0.4 mg total) by mouth daily after supper. Patient not taking: Reported on 12/28/2017 06/19/17   Debbe Odea, MD  trolamine salicylate (ASPERCREME/ALOE) 10 % cream Apply 1 application topically 2 (two) times daily.    [provider]    Family History Family History  Problem Relation Age of Onset  . Diabetes Mother   . Hypertension Mother   . Heart disease Mother   . Stroke Father   . Heart disease Father   . Diabetes Father   . Rheum arthritis Father   . Arthritis Father   . Cancer Sister  uterine cancer  . COPD Brother   . Hypertension Brother   . Cancer Brother        colon. brain, and hip  . Diabetes Brother   . Cancer Brother   . Diabetes Brother   . Cancer Sister        gyn malignancy  . Cancer Sister        pancreatic cancer  . Allergies Sister   . Allergies Son   . Asthma Sister   . Asthma Son   . Diabetes Maternal Grandmother   . Diabetes Maternal Grandfather   . Diabetes Paternal Grandmother   . Diabetes Paternal Grandfather     Social History Social History   Tobacco Use  . Smoking status: Former Smoker    Packs/day: 0.50    Years: 30.00    Pack years: 15.00    Types: Pipe, Cigars    Last attempt to quit: 08/20/1978    Years since quitting: 39.4  . Smokeless tobacco: Former Systems developer    Types: [place] date: 11/16/1990  Substance Use Topics  . Alcohol use: No  . Drug use: No     Allergies   Clindamycin/lincomycin and Erythromycin base   Review of Systems Review of Systems  Unable to perform ROS: Dementia  Constitutional: Negative for fever.  Gastrointestinal: Negative for vomiting.  Skin: Positive for wound.  level 5 caveat - dementia.    Physical Exam Updated Vital Signs BP (!) 190/74 (BP Location: Right Arm)   Pulse (!) 59   Temp (!) 97.5 F (36.4 C) (Oral)   Resp 16   SpO2 96%   Physical Exam  Constitutional: He appears well-developed and  well-nourished.  HENT:  Head: Atraumatic.  Mouth/Throat: Oropharynx is clear and moist.  No facial or scalp sts, bruising or tenderness.   Eyes: Pupils are equal, round, and reactive to light. Conjunctivae are normal.  Neck: Neck supple. No tracheal deviation present.  Cardiovascular: Normal rate, regular rhythm, normal heart sounds and intact distal pulses.  Pulmonary/Chest: Effort normal and breath sounds normal. No accessory muscle usage. No respiratory distress. He exhibits no tenderness.  Abdominal: Soft. He exhibits no distension. There is no tenderness.  Musculoskeletal: He exhibits no edema.  CTLS spine, non tender, aligned, no step off. Good rom bil extremities without pain or focal bony tenderness. Superficial skin tear to right elbow.   Neurological: He is alert.  Awake, alert, and content. Moves bil extremities purposefully with good strength.  Skin: Skin is warm and dry. He is not diaphoretic.  Psychiatric: He has a normal mood and affect.  Nursing note and vitals reviewed.    ED Treatments / Results  Labs (all labs ordered are listed, but only abnormal results are displayed) Labs Reviewed - No data to display  EKG None  Radiology No results found.  Procedures Procedures (including critical care time)  Medications Ordered in ED Medications - No data to display   Initial Impression / Assessment and Plan / ED Course  I have reviewed the triage vital signs and the nursing notes.  Pertinent labs & imaging results that were available during my care of the patient were reviewed by me and considered in my medical decision making (see chart for details).  Patient s/p rolling out of bed, with no reported loc or change in baseline level of consciousness.   On exam, no signs of trauma with exception of superficial, small skin tear to right elbow.    Reviewed nursing  notes and prior charts for additional history.   Wound cleaned, bacitracin and dressing applied.   Tetanus [place] up to date.   Patient currently appears comfortable, in no pain or distress, and stable for d/c.       Final Clinical Impressions(s) / ED Diagnoses   Final diagnoses:  None    ED Discharge Orders    None       Lajean Saver, MD 01/19/18 2251

## 2018-01-19 NOTE — ED Notes (Signed)
[facility] called for transport.  

## 2018-01-20 MED ORDER — ACETAMINOPHEN 325 MG PO TABS
650.0000 mg | ORAL_TABLET | Freq: Once | ORAL | Status: AC
  Administered 2018-01-20: 650 mg via ORAL
  Filled 2018-01-20: qty 2

## 2018-01-21 ENCOUNTER — Non-Acute Institutional Stay: Payer: Medicare Other | Admitting: Hospice and Palliative Medicine

## 2018-01-21 DIAGNOSIS — Z515 Encounter for palliative care: Secondary | ICD-10-CM

## 2018-01-22 NOTE — Progress Notes (Signed)
PALLIATIVE CARE CONSULT VISIT   PATIENT NAME: Alexander Goodwin DOB: 12-02-1920 MRN: [id number]  PRIMARY CARE PROVIDER: Patient, No Pcp Per  REFERRING PROVIDER: No referring provider defined for this encounter.  RESPONSIBLE PARTY:   Both sons   RECOMMENDATIONS and PLAN:  1. Pill burden: would recommend [place] mvt's...discuss eye drops with ophthalmologist. Patient is blind per sons. DC calcium. DC/reduce namenda. DC iron. Could possibly reduce or dc stool softener after iron dc'd.  2. Weak gait/high fall risk: request frequent checks in the evening prior to his bedtime. Consider sitters in the evening hours. 3. Memory loss: DC'ing namenda per psych NP. Continues in 7's on FAST scale. Remain in memory care unit. Recommend different colored plate for his vision loss. He is able to feed himself finger foods only. He is losing weight. He is unable to walk safely without close monitoring and assistance. Sentence structure getting more difficult for him.  4. ACP: DNR status. Changed MOST form to comfort measures. No feeding tube. No ventilator. Discuss hospitalization with sons prior to sending if able. Keep in facility unless comfort cannot be met. Discussed disease trajectory with dementia. Discussed hospice services when eligible.   I spent 60 minutes providing this consultation,  From 10  To 11 . More than 50% of the time in this consultation was spent coordinating communication.   HISTORY OF PRESENT ILLNESS:  Alexander Goodwin is a 84 y.o.  male with multiple medical problems including. Palliative Care was asked to help address symptom management and goals of care.   CODE STATUS: DNR  PPS: weak 40% HOSPICE ELIGIBILITY/DIAGNOSIS: TBD  PAST MEDICAL HISTORY:  Past Medical History:  Diagnosis Date  . Abscess of lung without pneumonia ([place])    Right upper lobe chronic lung abscess and prior left upper lobe lung abscess due to chronic aspiration  . Arthritis    right knee, hips, neck. He  denies any major limitation in activities  . BPH (benign prostatic hypertrophy) with urinary obstruction   . Dementia   . Diverticulitis of colon   . Dysphagia, oropharyngeal   . Esophagitis    Last EGD Fall '11   . GERD (gastroesophageal reflux disease)    well controlled with medication. No nocturnal symptoms on a regular basis  . Glaucoma    both eyes, followed closely by Dr. Janyth Contes  . History of colon polyps   . Hyperlipemia   . Hypertension   . Pancreatitis    resolved after GB surgery  . Seasonal allergies   . Stroke [facility])    TIAs - age 42    SOCIAL HX:  Social History   Tobacco Use  . Smoking status: Former Smoker    Packs/day: 0.50    Years: 30.00    Pack years: 15.00    Types: Pipe, Cigars    Last attempt to quit: 08/20/1978    Years since quitting: 39.4  . Smokeless tobacco: Former Systems developer    Types: [place] date: 11/16/1990  Substance Use Topics  . Alcohol use: No    ALLERGIES:  Allergies  Allergen Reactions  . Clindamycin/Lincomycin Itching  . Erythromycin Base Rash     PERTINENT MEDICATIONS:  Outpatient Encounter Medications as of 01/21/2018  Medication Sig  . acetaminophen (TYLENOL) 325 MG tablet Take 2 tablets (650 mg total) by mouth every 6 (six) hours as needed for mild pain (or Fever >/= 101).  Marland Kitchen acidophilus (RISAQUAD) CAPS capsule Take 1 capsule by mouth daily.  Marland Kitchen  aspirin EC 325 MG EC tablet Take 1 tablet (325 mg total) by mouth daily with breakfast.  . brimonidine (ALPHAGAN P) 0.1 % SOLN Place 1 drop into both eyes 3 (three) times daily.   . brinzolamide (AZOPT) 1 % ophthalmic suspension Place 1 drop into both eyes 3 (three) times daily.   . calcium-vitamin D (OSCAL 500/200 D-3) 500-200 MG-UNIT per tablet Take 1 tablet by mouth daily.   . cholecalciferol (VITAMIN D) 1000 units tablet Take 1,000 Units by mouth daily.  Marland Kitchen docusate sodium (COLACE) 100 MG capsule Take 1 capsule (100 mg total) by mouth 2 (two) times daily.  . ferrous gluconate (IRON  27) 240 (27 FE) MG tablet Take 240 mg by mouth daily.  Marland Kitchen HYDROcodone-acetaminophen (NORCO/VICODIN) 5-325 MG tablet Take 1 tablet every 6 (six) hours as needed by mouth for moderate pain. (Patient not taking: Reported on 12/28/2017)  . latanoprost (XALATAN) 0.005 % ophthalmic solution Place 1 drop into both eyes at bedtime.   Marland Kitchen loratadine (CLARITIN) 10 MG tablet Take 10 mg by mouth daily.  . Melatonin 5 MG TABS Take 1 tablet by mouth at bedtime.  . memantine (NAMENDA) 5 MG tablet Take 5 mg by mouth 2 (two) times daily.  Marland Kitchen OLANZapine zydis (ZYPREXA) 5 MG disintegrating tablet Take 0.5 tablets (2.5 mg total) by mouth daily as needed (severe agitation). (Patient not taking: Reported on 12/28/2017)  . ondansetron (ZOFRAN) 4 MG tablet Take 4 mg by mouth every 8 (eight) hours as needed for nausea or vomiting.  . pantoprazole (PROTONIX) 40 MG tablet Take 40 mg by mouth daily.  . QUEtiapine (SEROQUEL) 25 MG tablet Take 2 tablets (50 mg total) by mouth daily at 6 PM. (Patient not taking: Reported on 12/28/2017)  . sertraline (ZOLOFT) 50 MG tablet Take 50 mg by mouth daily.  . Skin Protectants, Misc. (EUCERIN) cream Apply 1 application topically daily as needed for dry skin.  . Skin Protectants, Misc. (MINERIN) CREA Apply 1 application topically daily as needed (to both feet for dry skin).  . sucralfate (CARAFATE) 1 G tablet Take 1 tablet by mouth daily.   . tamsulosin (FLOMAX) 0.4 MG CAPS capsule Take 1 capsule (0.4 mg total) by mouth daily after supper. (Patient not taking: Reported on 12/28/2017)  . trolamine salicylate (ASPERCREME/ALOE) 10 % cream Apply 1 application topically 2 (two) times daily.   No facility-administered encounter medications on file as of 01/21/2018.     PHYSICAL EXAM:   General: Frail, elderly male sitting in high back WC sleeping Cardiovascular: Irreg rhythm; reg rate Pulmonary: diminished in bases; shallow breathing Abdomen: soft, active BS Extremities: weak legs upon  walking/standing Skin: fragile; thin and easily bruising Neurological: +generalized weakness: word searching; +memory loss+  Nathanial Rancher, NP

## 2018-02-07 IMAGING — CT CT CERVICAL SPINE W/O CM
3 of 4 series · 14 of 33 positions shown, 17 images · non-contrast
Comparison: Head CT 02/06/2015

CLINICAL DATA: Unwitnessed fall.  Initial encounter.

EXAM:
CT HEAD WITHOUT CONTRAST
CT CERVICAL SPINE WITHOUT CONTRAST
TECHNIQUE: Multidetector CT imaging of the head and cervical spine was
performed following the standard protocol without intravenous
contrast. Multiplanar CT image reconstructions of the cervical spine
were also generated.

[Series 4: head bone · axial · 0.41mm/px · z∈[-172,-52]mm · 6 of 78 slices shown, 8 images]
[im 9/78  soft-tissue]
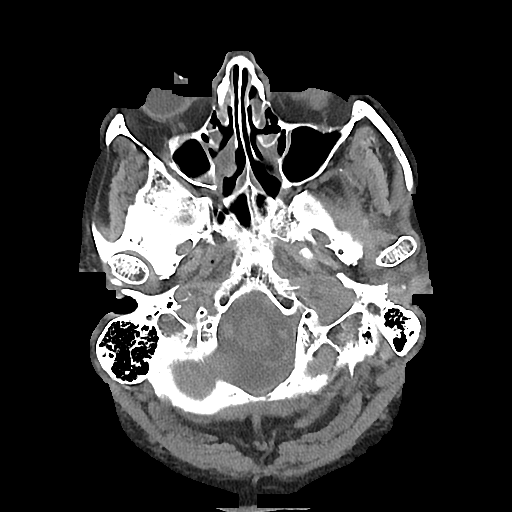
[im 9/78  bone]
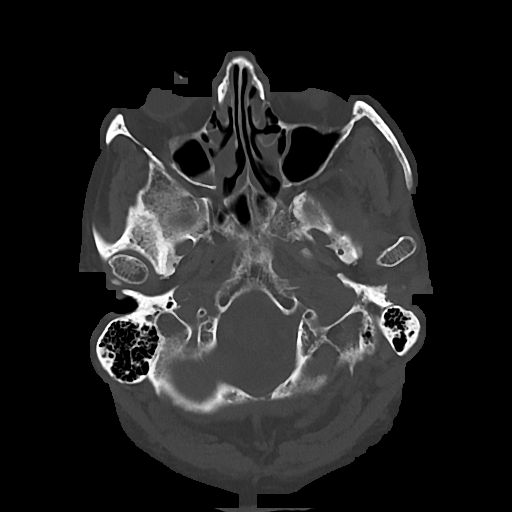
[im 26/78  bone]
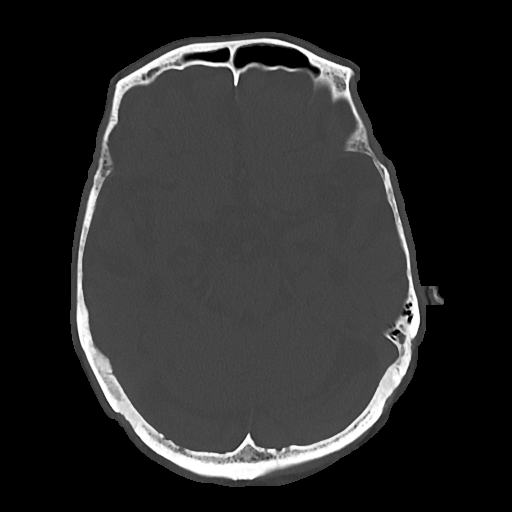
[im 35/78  bone]
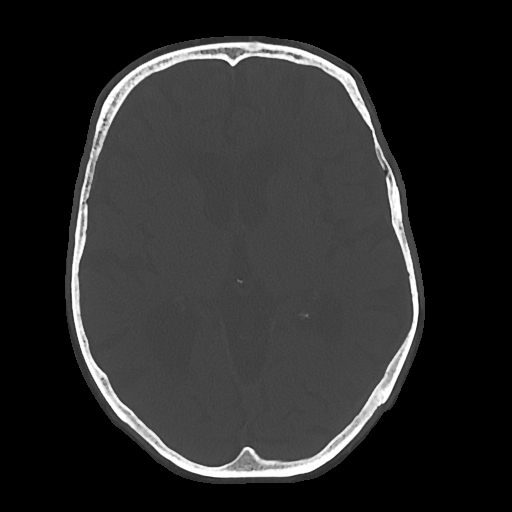
[im 43/78  bone]
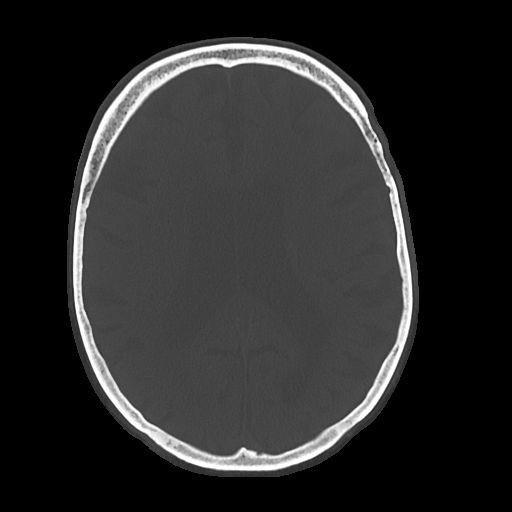
[im 60/78  soft-tissue]
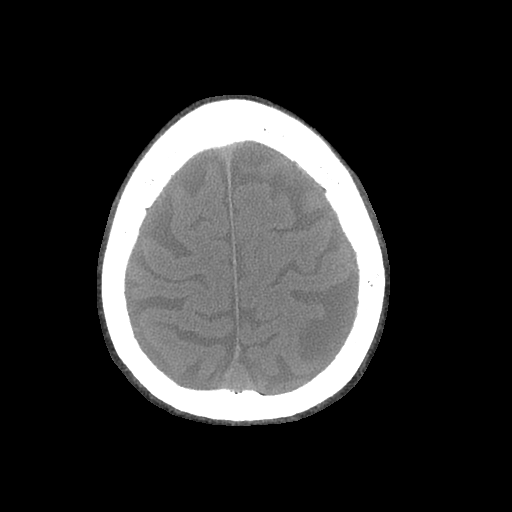
[im 60/78  bone]
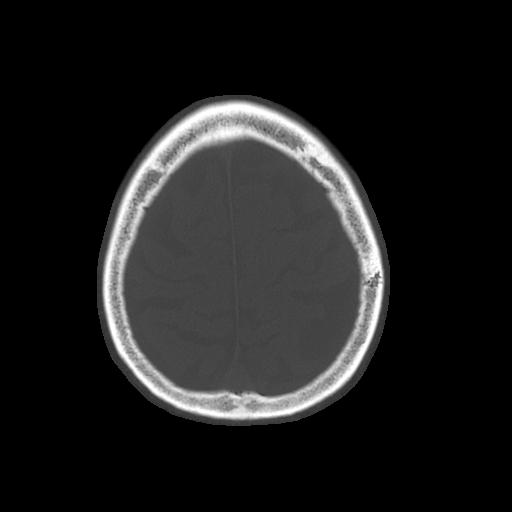
[im 69/78  bone]
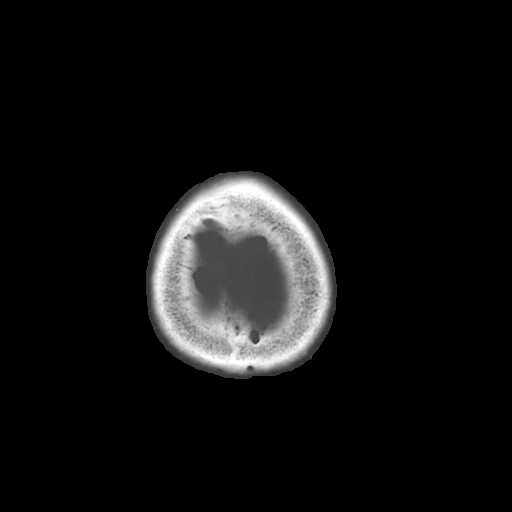

[Series 5: cor soft · coronal · 0.32mm/px · 3 of 67 slices shown]
[im 14/67  bone]
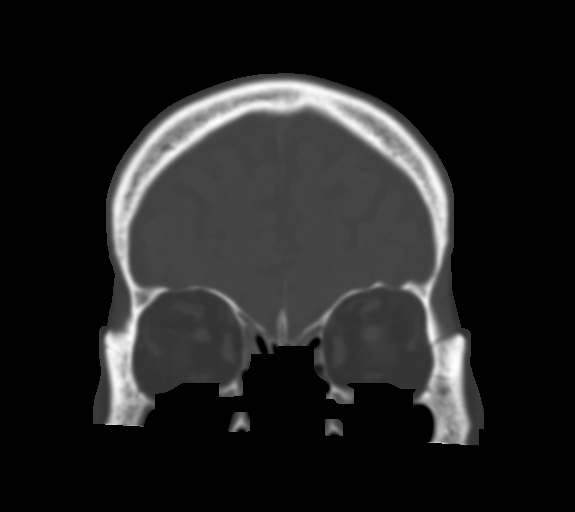
[im 27/67  bone]
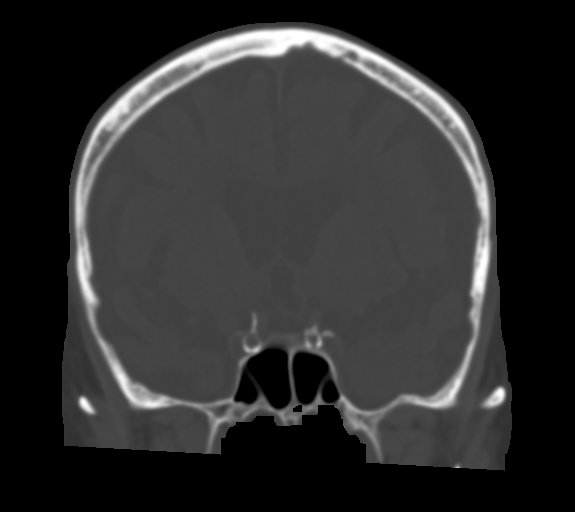
[im 40/67  bone]
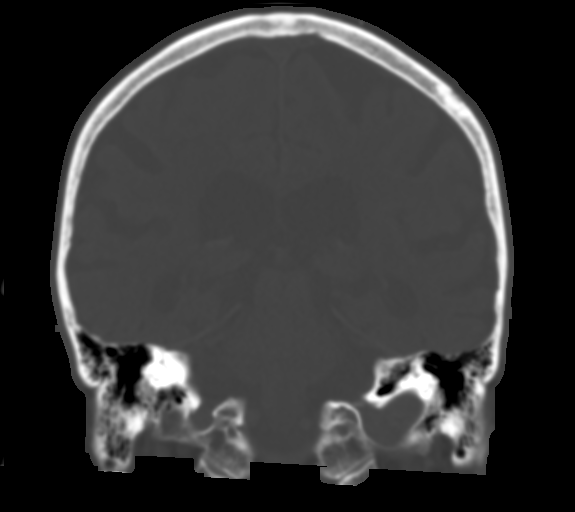

[Series 6: sag soft · sagittal · 0.32mm/px · 5 of 60 slices shown, 6 images]
[im 20/60  bone]
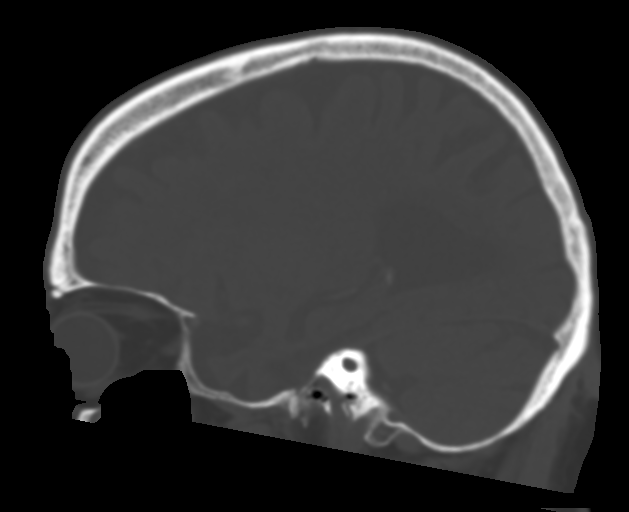
[im 25/60  bone]
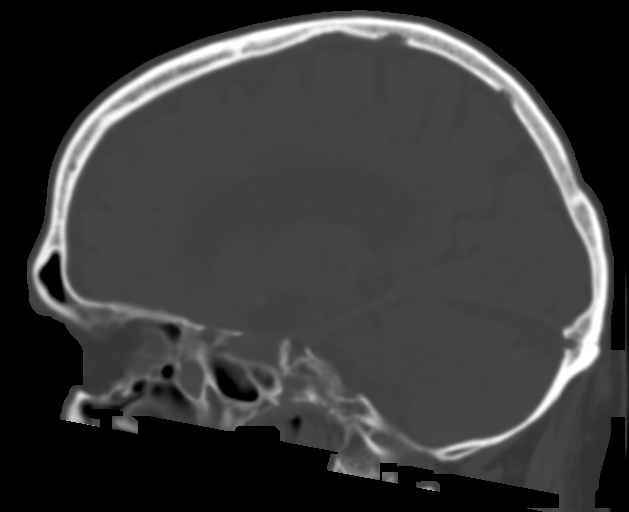
[im 30/60  soft-tissue]
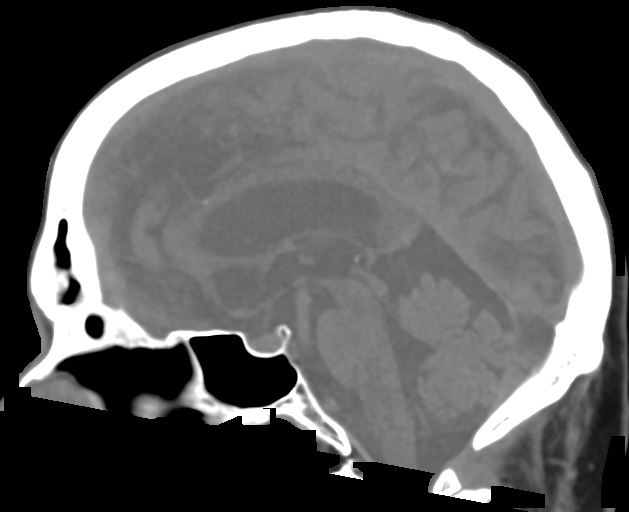
[im 30/60  bone]
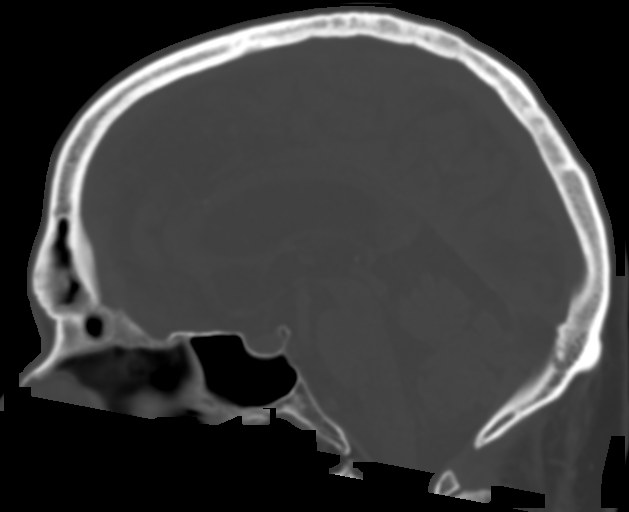
[im 35/60  bone]
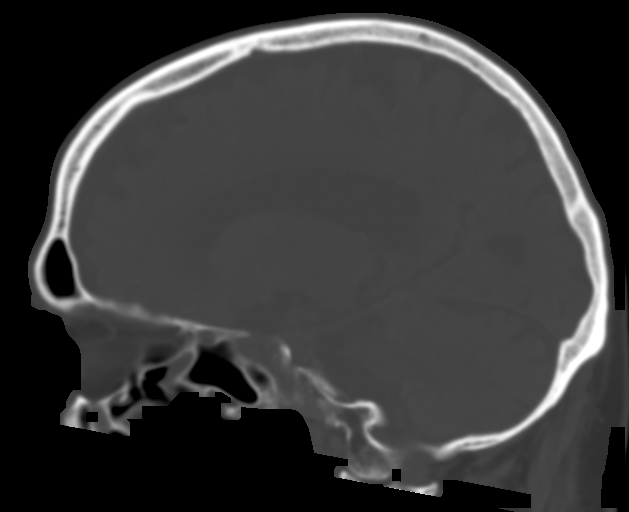
[im 40/60  bone]
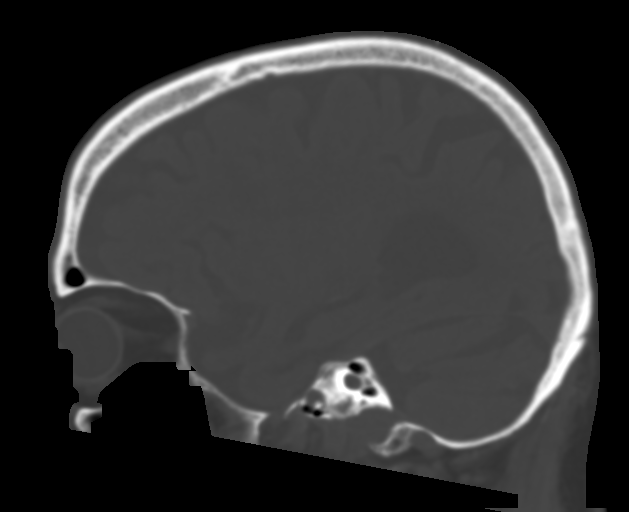

[14 of 33 positions shown; findings below may reference images not displayed]

FINDINGS: CT HEAD FINDINGS

Brain: Moderate generalized cerebral atrophy is unchanged.
Periventricular white matter hypodensities are stable to minimally
increased in are nonspecific but compatible with chronic small
vessel ischemic disease, mild for age. There is no evidence of acute
infarct, intracranial hemorrhage, mass, midline shift, or
extra-axial fluid collection.

Vascular: Calcified atherosclerosis at the skullbase. No hyperdense
vessel.

Skull: No fracture or focal osseous lesion.

Sinuses/Orbits: Bilateral cataract extraction. Moderate bilateral
ethmoid air cell opacification. Milder bilateral frontal sinus
mucosal thickening. Clear mastoid air cells.

Other: None.

CT CERVICAL SPINE FINDINGS

Alignment: Reversal of the normal cervical lordosis. 2 mm
anterolisthesis of C7 on T1, likely degenerative given facet
arthrosis.

Skull base and vertebrae: No evidence of acute fracture or
destructive osseous lesion.

Soft tissues and spinal canal: No prevertebral fluid or swelling. No
visible canal hematoma.

Disc levels: Advanced disc degeneration throughout the cervical
spine. Interbody ankylosis at C2-3 and C3-4. Facet ankylosis
bilaterally at C2-3 and on the right at C3-4. Degenerative endplate
sclerosis and spurring from C4-C7. Asymmetric moderate left facet
arthrosis at C7-T1.

Upper chest: Partially visualized pleural thickening/ scarring in
the right greater than left lung apices.

Other: Calcified atherosclerosis at the carotid bifurcations.
IMPRESSION: 1. No evidence of acute intracranial abnormality.
2. Cerebral atrophy and mild chronic small vessel ischemic disease.
3. No evidence of acute fracture or traumatic subluxation in the
cervical spine. Advanced diffuse disc degeneration.

## 2018-02-07 IMAGING — CR DG HIP (WITH OR WITHOUT PELVIS) 2-3V*L*
3 series · 3 of 3 positions shown · non-contrast
Comparison: None.

CLINICAL DATA: Status post fall, with left hip pain. Initial
encounter.

EXAM:
DG HIP (WITH OR WITHOUT PELVIS) 2-3V LEFT

[pelvis ap]
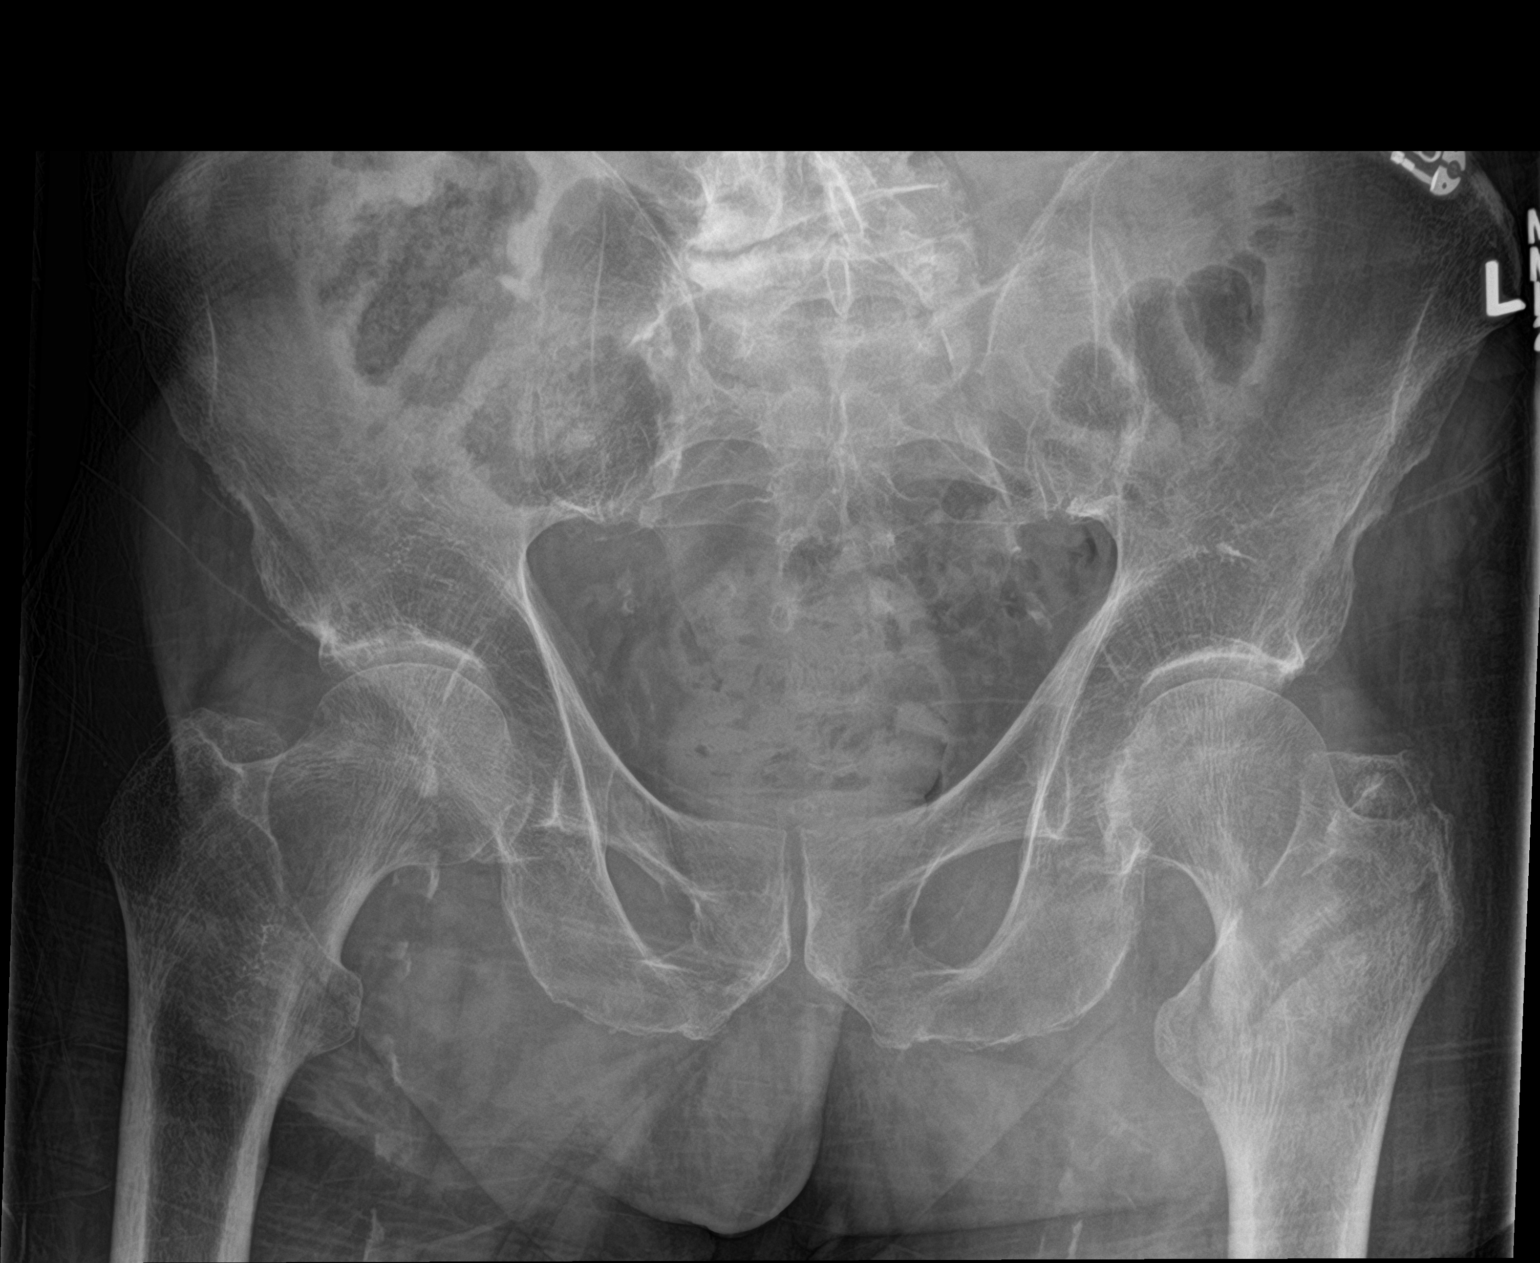

[hip ap]
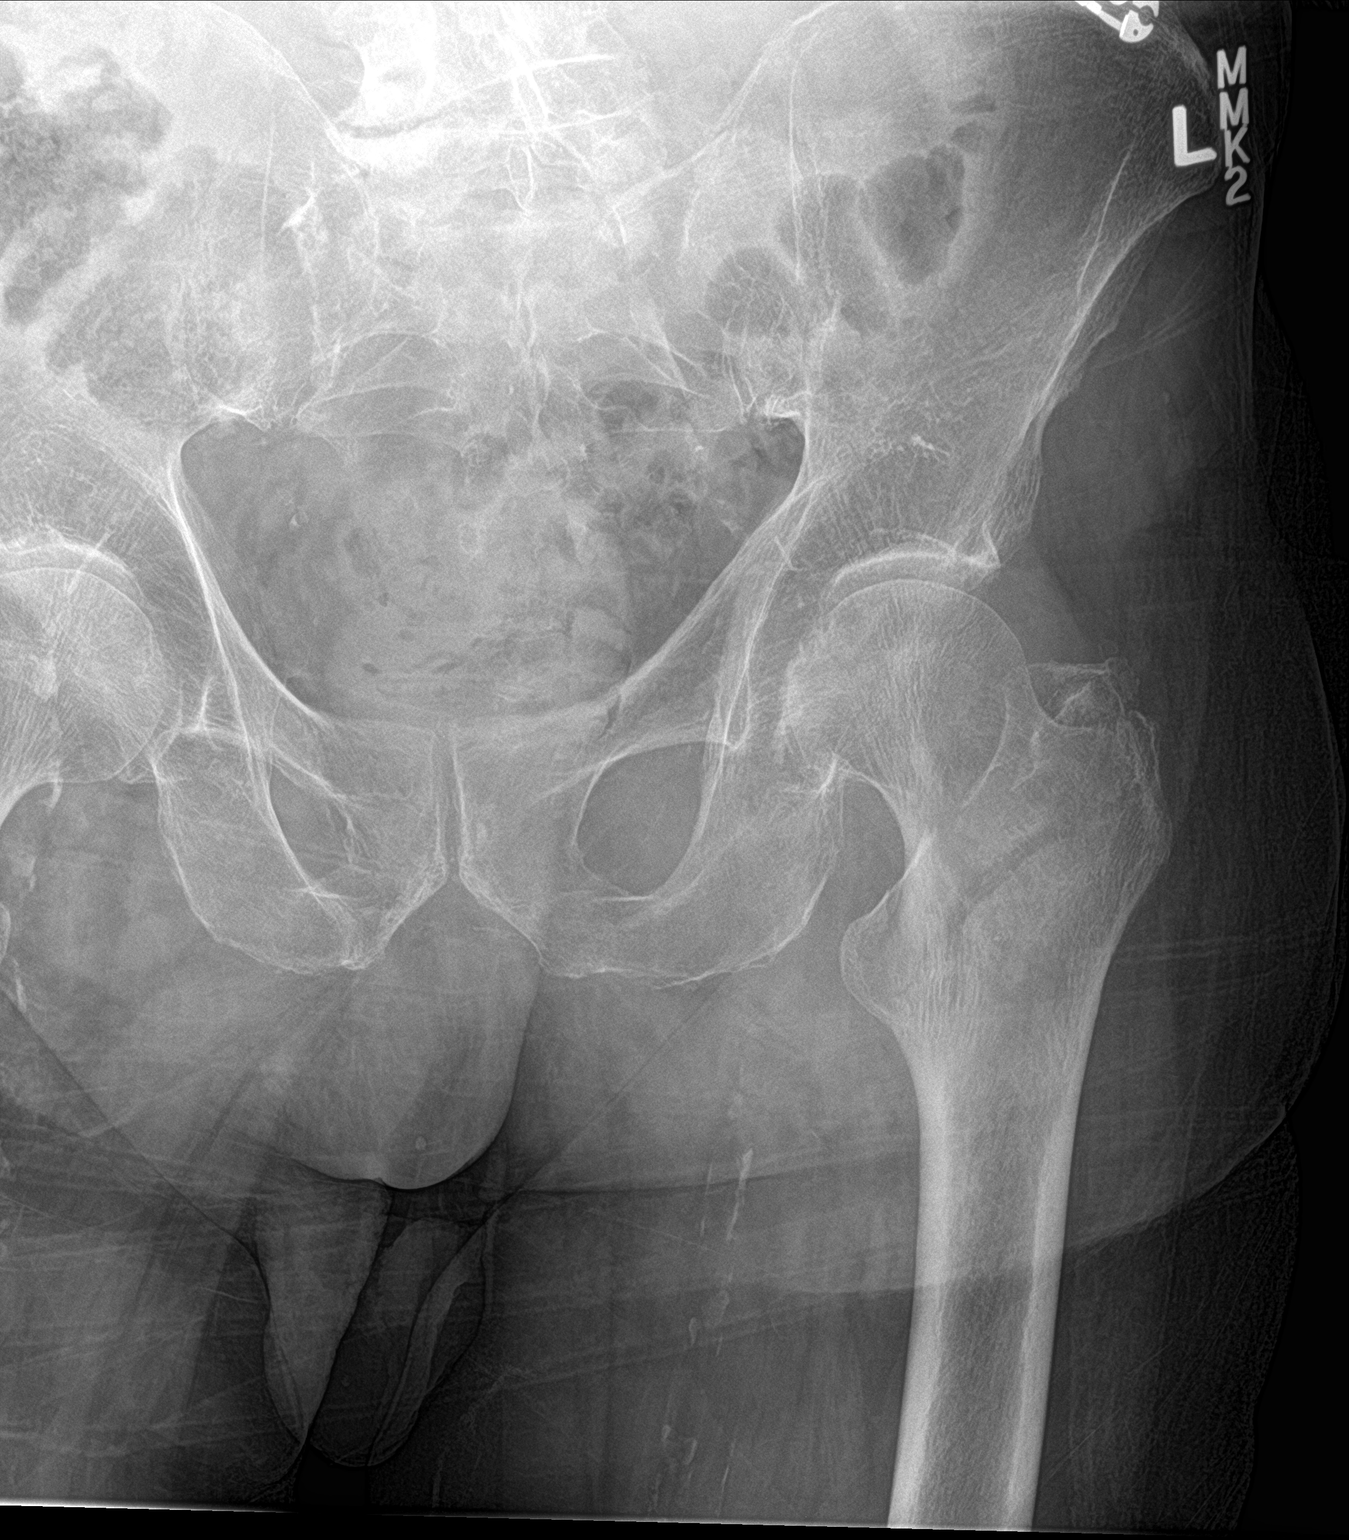

[hip x-table]
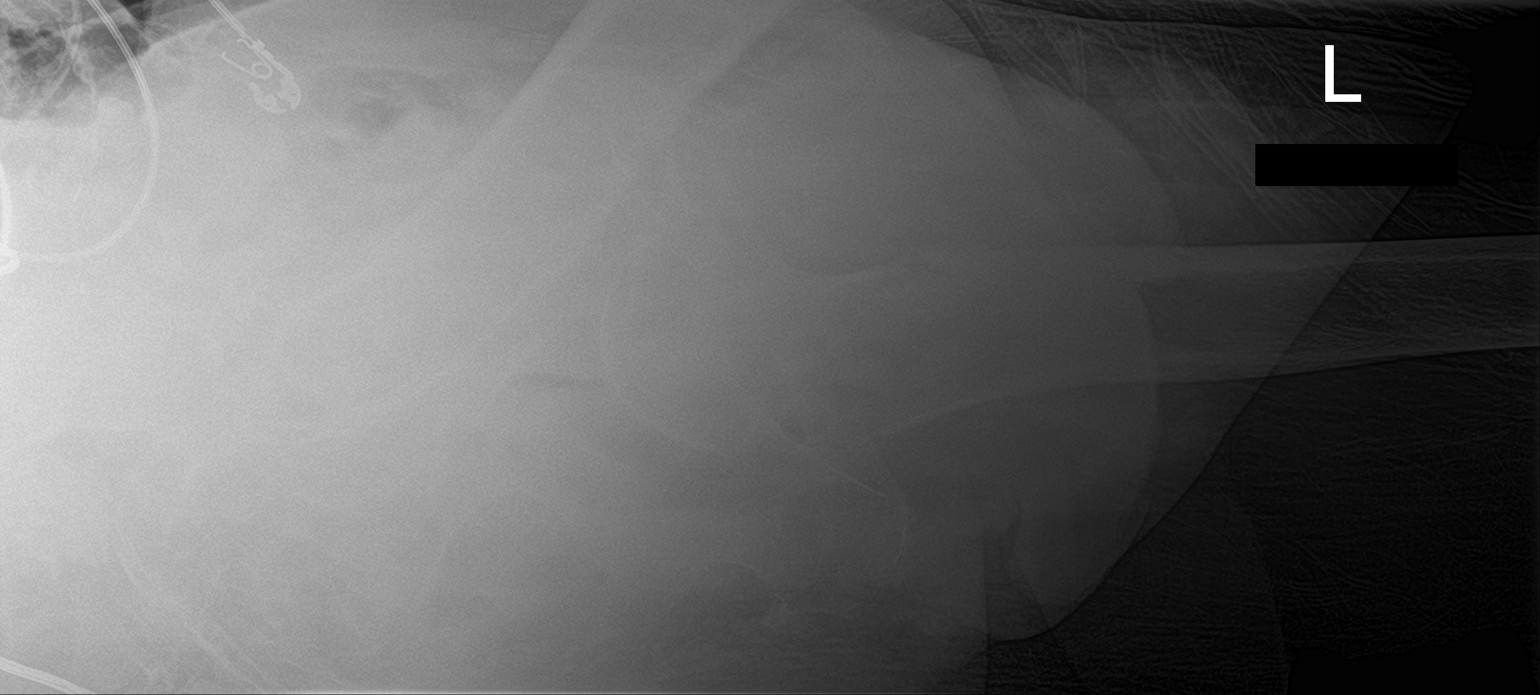

[3 of 3 positions shown; findings below may reference images not displayed]

FINDINGS: There is a mildly displaced left femoral intertrochanteric fracture.
The left femoral head remains seated at the acetabulum. The right
hip joint is unremarkable in appearance.

Mild degenerative change is noted at the lower lumbar spine. The
sacroiliac joints are grossly unremarkable. The visualized bowel gas
pattern is unremarkable in appearance. Scattered vascular
calcifications are seen.
IMPRESSION: 1. Mildly displaced left femoral intertrochanteric fracture.
2. Scattered vascular calcifications seen.

## 2018-02-09 IMAGING — DX DG CHEST 1V PORT
1 series · 1 of 1 positions shown · non-contrast
Comparison: 06/16/2017.  03/12/2016.  10/16/2015.

CLINICAL DATA: Cough.  Shortness of breath .

EXAM:
PORTABLE CHEST 1 VIEW

[chest ap]
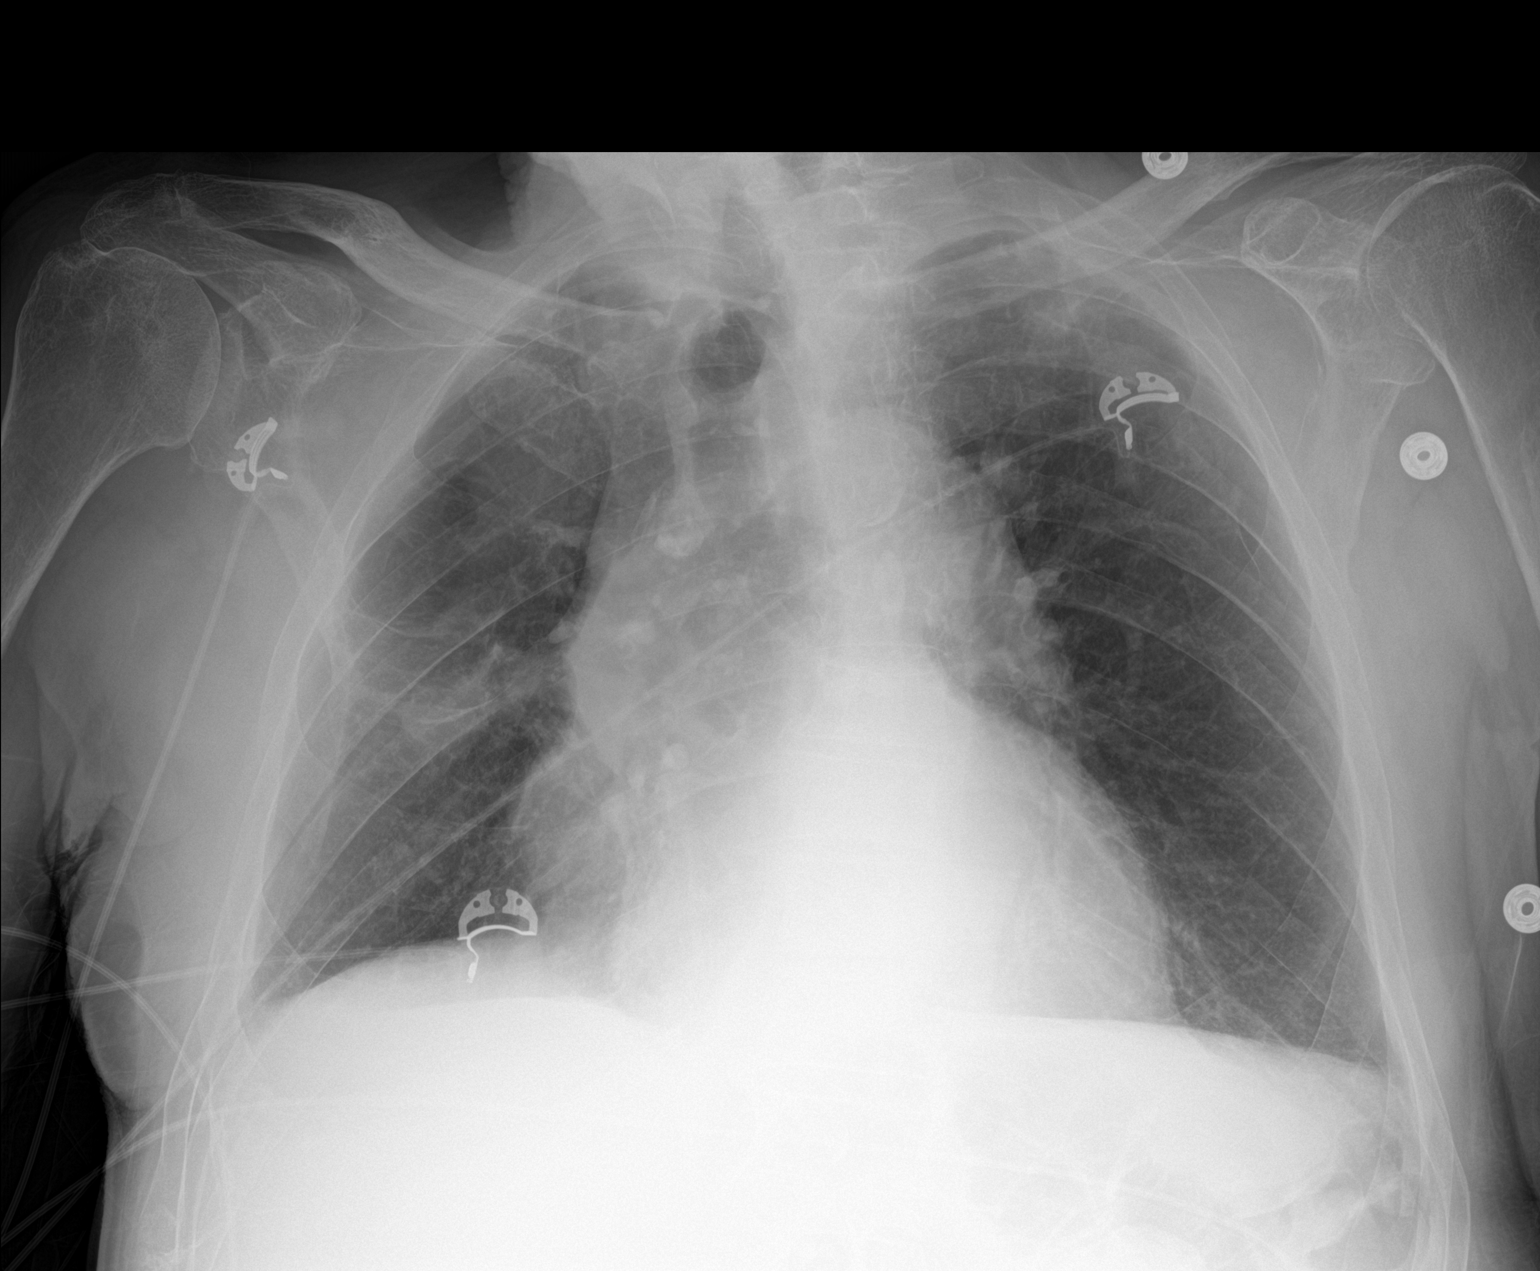

[1 of 1 positions shown; findings below may reference images not displayed]

FINDINGS: Mediastinum and hilar structures are stable. Stable cardiomegaly.
Persistent rounded densities noted over the right upper and mid
chest most likely pleural-parenchymal scarring. Similar findings
noted on multiple prior exams. Stable calcified nodular densities
are noted bilaterally consistent with granulomas. No acute
infiltrate. No prominent pleural effusion or pneumothorax . No acute
bony abnormality.
IMPRESSION: Stable rounded opacities noted over the right upper and mid chest
most likely related pleural-parenchymal scarring. No change from
multiple prior exams. Stable calcifications noted both lungs most
likely granulomas. No acute abnormality identified.

## 2018-02-10 ENCOUNTER — Non-Acute Institutional Stay: Payer: Medicare Other | Admitting: Hospice and Palliative Medicine

## 2018-02-10 DIAGNOSIS — Z515 Encounter for palliative care: Secondary | ICD-10-CM

## 2018-02-10 NOTE — Progress Notes (Signed)
PALLIATIVE CARE CONSULT VISIT   PATIENT NAME: Alexander Goodwin DOB: 1920-09-08 MRN: [id number]  PRIMARY CARE PROVIDER: Delle Reining, NP Onsite  REFERRING PROVIDER: Delle Reining, NP   RESPONSIBLE PARTY:   sons   RECOMMENDATIONS and PLAN:  1. Weakness: secondary to advanced age, hx of CVA and worsening dementia as well as others outlined below. He continues in the memory care unit at [facility] getting excellent care. No falls have been reported recently. Staff making extra checks at night. Would continue high back WC. Would advise changing the color of his plate so that he can better see his food due to his vision loss. Would encourage protein shakes. Continue to monitor from palliative care perspective. No weight loss since last visit. 2. ACP: DNR on chart. Avoid hospital if able. MOST form completed with both sons present on previous visit. Comfort measures requested. No IV fluids/IV abx, feeding tube or ventilator. He does desire PO abx if needed within the AL setting.  I spent 15 minutes providing this consultation,  from 12:30pm to 12:45pm. More than 50% of the time in this consultation was spent interviewing staff, assessing patient and coordinating communication.   HISTORY OF PRESENT ILLNESS:  Alexander Goodwin is a 84 y.o. year old male with multiple medical problems who resides in the memory care setting at [facility]. Palliative Care was asked to help address symtpm management needs and goals of care as his diseases progress.   CODE STATUS: DNR  PPS: 30% HOSPICE ELIGIBILITY/DIAGNOSIS: TBD  PAST MEDICAL HISTORY:  Past Medical History:  Diagnosis Date  . Abscess of lung without pneumonia ([place])    Right upper lobe chronic lung abscess and prior left upper lobe lung abscess due to chronic aspiration  . Arthritis    right knee, hips, neck. He denies any major limitation in activities  . BPH (benign prostatic hypertrophy) with urinary obstruction   . Dementia   . Diverticulitis of  colon   . Dysphagia, oropharyngeal   . Esophagitis    Last EGD Fall '11   . GERD (gastroesophageal reflux disease)    well controlled with medication. No nocturnal symptoms on a regular basis  . Glaucoma    both eyes, followed closely by Dr. Janyth Contes  . History of colon polyps   . Hyperlipemia   . Hypertension   . Pancreatitis    resolved after GB surgery  . Seasonal allergies   . Stroke [facility])    TIAs - age 67    SOCIAL HX:  Social History   Tobacco Use  . Smoking status: Former Smoker    Packs/day: 0.50    Years: 30.00    Pack years: 15.00    Types: Pipe, Cigars    Last attempt to quit: 08/20/1978    Years since quitting: 39.5  . Smokeless tobacco: Former Systems developer    Types: [place] date: 11/16/1990  Substance Use Topics  . Alcohol use: No    ALLERGIES:  Allergies  Allergen Reactions  . Clindamycin/Lincomycin Itching  . Erythromycin Base Rash     PERTINENT MEDICATIONS:  Outpatient Encounter Medications as of 02/10/2018  Medication Sig  . acetaminophen (TYLENOL) 325 MG tablet Take 2 tablets (650 mg total) by mouth every 6 (six) hours as needed for mild pain (or Fever >/= 101).  Marland Kitchen acidophilus (RISAQUAD) CAPS capsule Take 1 capsule by mouth daily.  Marland Kitchen aspirin EC 325 MG EC tablet Take 1 tablet (325 mg total) by mouth  daily with breakfast.  . brimonidine (ALPHAGAN P) 0.1 % SOLN Place 1 drop into both eyes 3 (three) times daily.   . brinzolamide (AZOPT) 1 % ophthalmic suspension Place 1 drop into both eyes 3 (three) times daily.   . calcium-vitamin D (OSCAL 500/200 D-3) 500-200 MG-UNIT per tablet Take 1 tablet by mouth daily.   . cholecalciferol (VITAMIN D) 1000 units tablet Take 1,000 Units by mouth daily.  Marland Kitchen docusate sodium (COLACE) 100 MG capsule Take 1 capsule (100 mg total) by mouth 2 (two) times daily.  . ferrous gluconate (IRON 27) 240 (27 FE) MG tablet Take 240 mg by mouth daily.  Marland Kitchen HYDROcodone-acetaminophen (NORCO/VICODIN) 5-325 MG tablet Take 1 tablet every 6  (six) hours as needed by mouth for moderate pain. (Patient not taking: Reported on 12/28/2017)  . latanoprost (XALATAN) 0.005 % ophthalmic solution Place 1 drop into both eyes at bedtime.   Marland Kitchen loratadine (CLARITIN) 10 MG tablet Take 10 mg by mouth daily.  . Melatonin 5 MG TABS Take 1 tablet by mouth at bedtime.  . memantine (NAMENDA) 5 MG tablet Take 5 mg by mouth 2 (two) times daily.  Marland Kitchen OLANZapine zydis (ZYPREXA) 5 MG disintegrating tablet Take 0.5 tablets (2.5 mg total) by mouth daily as needed (severe agitation). (Patient not taking: Reported on 12/28/2017)  . ondansetron (ZOFRAN) 4 MG tablet Take 4 mg by mouth every 8 (eight) hours as needed for nausea or vomiting.  . pantoprazole (PROTONIX) 40 MG tablet Take 40 mg by mouth daily.  . QUEtiapine (SEROQUEL) 25 MG tablet Take 2 tablets (50 mg total) by mouth daily at 6 PM. (Patient not taking: Reported on 12/28/2017)  . sertraline (ZOLOFT) 50 MG tablet Take 50 mg by mouth daily.  . Skin Protectants, Misc. (EUCERIN) cream Apply 1 application topically daily as needed for dry skin.  . Skin Protectants, Misc. (MINERIN) CREA Apply 1 application topically daily as needed (to both feet for dry skin).  . sucralfate (CARAFATE) 1 G tablet Take 1 tablet by mouth daily.   . tamsulosin (FLOMAX) 0.4 MG CAPS capsule Take 1 capsule (0.4 mg total) by mouth daily after supper. (Patient not taking: Reported on 12/28/2017)  . trolamine salicylate (ASPERCREME/ALOE) 10 % cream Apply 1 application topically 2 (two) times daily.   No facility-administered encounter medications on file as of 02/10/2018.     PHYSICAL EXAM:   General: Elderly, Caucasian male sitting at table eating slowly in NAD Cardiovascular: irreg rhythm; reg rate Pulmonary: breathing easily; diminished in bases Abdomen: soft, active Bs  Extremities: weak upon standing; better posture in high back WC Skin: thin, easily bruised Neurological: alert; HOH, can answer in sentences; word searches at times  + generalized weakness  Alexander Rancher, NP

## 2018-05-15 ENCOUNTER — Non-Acute Institutional Stay: Payer: Medicare Other

## 2018-05-15 VITALS — BP 132/60 | HR 66 | Resp 16 | Wt 146.4 lb

## 2018-05-15 DIAGNOSIS — Z515 Encounter for palliative care: Secondary | ICD-10-CM

## 2018-05-18 NOTE — Progress Notes (Signed)
Palliative Care RN Note Alexander Goodwin Date: 05/15/18  Reason for Visit: Visit made at the request of Palliative NP  Assessment: out of bed to wheelchair in facility dining room. Poor trunk/neck control, sitting with head slumped over. Minimal verbalizations that are like a whisper. Difficult to understand at times.  Meal intake has declined, per report of facility caregivers. Recent diet change to finger foods.   Patient appears fatigued with  weakness.    Code Status: DNR, MOST form noting comfort measures, no feeding tube, no ventilator. Would like to avoid hospitalizations unless comfort can not be maintained.   PPS: 30% Poor safety awareness, chair alarm in place.  Chart Review:  04/29/18- Namenda D/C  05/02/18- Urine C&S, no evidence of infection.  Physical Exam: Vitals: 132/60 66 16 SPO2 99% RA  Weight: 146.4 lbs. 10 pound weight loss over past 6 months (6% weight loss)  Sons have opted for no supplements  Lungs: Diminished, unlabored respirations Cardiac: irregular rhythm,  Extremities: no edema present Skin: fragile, no reported concerns Neuro: awake, attempts to interact with this RN Time Spent:  45 minutes with documentation.                    Maxwell Caul, RN, BSN,               Palliative Care Clinical Navigator

## 2018-06-19 ENCOUNTER — Non-Acute Institutional Stay: Payer: Medicare Other | Admitting: Internal Medicine

## 2018-06-19 VITALS — BP 134/86 | HR 88 | Resp 16 | Ht 69.0 in | Wt 134.0 lb

## 2018-06-19 DIAGNOSIS — R634 Abnormal weight loss: Secondary | ICD-10-CM

## 2018-06-19 DIAGNOSIS — F01518 Vascular dementia, unspecified severity, with other behavioral disturbance: Secondary | ICD-10-CM

## 2018-06-19 DIAGNOSIS — F0151 Vascular dementia with behavioral disturbance: Secondary | ICD-10-CM

## 2018-06-19 DIAGNOSIS — R2689 Other abnormalities of gait and mobility: Secondary | ICD-10-CM

## 2018-06-19 NOTE — Progress Notes (Signed)
PALLIATIVE CARE CONSULT VISIT   PATIENT NAME: Alexander Goodwin DOB: 03/28/1921 MRN: [id number]  PRIMARY CARE PROVIDER: Farrel Conners NP ([facility]) REFERRING PROVIDER:  Farrel Conners NP ([facility]) RESPONSIBLE PARTY:   (son) Jayen Bromwell [phone]    IMPRESSIONS / RECOMMENDATIONS:  1. Terminal vascular dementia.  Patients PPS is 30%. He is dependent for hygiene, dressing, transfers and now for feeding. He is incontinent of bowel and bladder. He is constantly confused, and oriented to name only. He is legally blind and hard of hearing. Staff report increased daytime somnolence. He in increasingly hard to understand, and his speech is garbled or a word salad. Over this last week he has been progressively anxious and agitated. He is both verbally and physically aggressive towards staff.   2. Weight loss. Patient's oral intake has decreased, and he can no longer feed himself. His weight is 134lbs, which is a weight loss of 11.7lbs (9% of body weight) over previous 3 weeks, and 23.8lbs (15% of body weight) over the last 5 months. At a height of 5'9" his BMI is 19.8kg/m2.  3. Decreased mobility. He is a 1-2 person transfer and is no longer ambulating. He has poor safety awareness. He fell 1 week ago when attempting to get out of his wheelchair.  4. Goals of Care. I spoke with son [name], who wishes to pursue comfort care. We discussed that patient is eligible for hospice services, and [name] wishes for this referral. Memory Care director Tad Moore had previously been in contact with the facility NP Farrel Conners, and has her prior agreement for patient referral and agreemnt to continue as the patient's  Attending physician.   I spent 70 minutes providing this consultation,  from 3 to 4:10pm. More than 50% of the time in this consultation was spent coordinating communication.   HISTORY OF PRESENT ILLNESS: [name] is a 84 y.o. year old resident of [facility] (room 411) with h/o  vascular dementia, prior strokes, pneumonia, dysphagia   chronic pulmonary aspiration (with secondary chronic R lung abscess cavitation and prior left upper lobe lung abscess) HTN, GERD, and BPH. Palliative Care was asked to help address goals of care.   CODE STATUS: DNR  PPS: 30% HOSPICE ELIGIBILITY/DIAGNOSIS: yes / terminal dementia  PAST MEDICAL HISTORY:  Past Medical History:  Diagnosis Date  . Abscess of lung without pneumonia ([place])    Right upper lobe chronic lung abscess and prior left upper lobe lung abscess due to chronic aspiration  . Arthritis    right knee, hips, neck. He denies any major limitation in activities  . BPH (benign prostatic hypertrophy) with urinary obstruction   . Dementia   . Diverticulitis of colon   . Dysphagia, oropharyngeal   . Esophagitis    Last EGD Fall '11   . GERD (gastroesophageal reflux disease)    well controlled with medication. No nocturnal symptoms on a regular basis  . Glaucoma    both eyes, followed closely by Dr. Janyth Contes  . History of colon polyps   . Hyperlipemia   . Hypertension   . Pancreatitis    resolved after GB surgery  . Seasonal allergies   . Stroke [facility])    TIAs - age 33    SOCIAL HX:  Social History   Tobacco Use  . Smoking status: Former Smoker    Packs/day: 0.50    Years: 30.00    Pack years: 15.00    Types: Pipe, Cigars  Last attempt to quit: 08/20/1978    Years since quitting: 39.8  . Smokeless tobacco: Former Systems developer    Types: [place] date: 11/16/1990  Substance Use Topics  . Alcohol use: No    ALLERGIES:  Allergies  Allergen Reactions  . Clindamycin/Lincomycin Itching  . Erythromycin Base Rash     PERTINENT MEDICATIONS:  Outpatient Encounter Medications as of 06/19/2018  Medication Sig  . acetaminophen (TYLENOL) 325 MG tablet Take 2 tablets (650 mg total) by mouth every 6 (six) hours as needed for mild pain (or Fever >/= 101).  Marland Kitchen acidophilus (RISAQUAD) CAPS capsule Take 1 capsule by mouth  daily.  Marland Kitchen aspirin EC 325 MG EC tablet Take 1 tablet (325 mg total) by mouth daily with breakfast.  . brimonidine (ALPHAGAN P) 0.1 % SOLN Place 1 drop into both eyes 3 (three) times daily.   . brinzolamide (AZOPT) 1 % ophthalmic suspension Place 1 drop into both eyes 3 (three) times daily.   . calcium-vitamin D (OSCAL 500/200 D-3) 500-200 MG-UNIT per tablet Take 1 tablet by mouth daily.   . cholecalciferol (VITAMIN D) 1000 units tablet Take 1,000 Units by mouth daily.  Marland Kitchen docusate sodium (COLACE) 100 MG capsule Take 1 capsule (100 mg total) by mouth 2 (two) times daily.  . ferrous gluconate (IRON 27) 240 (27 FE) MG tablet Take 240 mg by mouth daily.  Marland Kitchen HYDROcodone-acetaminophen (NORCO/VICODIN) 5-325 MG tablet Take 1 tablet every 6 (six) hours as needed by mouth for moderate pain. (Patient not taking: Reported on 12/28/2017)  . latanoprost (XALATAN) 0.005 % ophthalmic solution Place 1 drop into both eyes at bedtime.   Marland Kitchen loratadine (CLARITIN) 10 MG tablet Take 10 mg by mouth daily.  . Melatonin 5 MG TABS Take 1 tablet by mouth at bedtime.  . memantine (NAMENDA) 5 MG tablet Take 5 mg by mouth 2 (two) times daily.  Marland Kitchen OLANZapine zydis (ZYPREXA) 5 MG disintegrating tablet Take 0.5 tablets (2.5 mg total) by mouth daily as needed (severe agitation). (Patient not taking: Reported on 12/28/2017)  . ondansetron (ZOFRAN) 4 MG tablet Take 4 mg by mouth every 8 (eight) hours as needed for nausea or vomiting.  . pantoprazole (PROTONIX) 40 MG tablet Take 40 mg by mouth daily.  . QUEtiapine (SEROQUEL) 25 MG tablet Take 2 tablets (50 mg total) by mouth daily at 6 PM. (Patient not taking: Reported on 12/28/2017)  . sertraline (ZOLOFT) 50 MG tablet Take 50 mg by mouth daily.  . Skin Protectants, Misc. (EUCERIN) cream Apply 1 application topically daily as needed for dry skin.  . Skin Protectants, Misc. (MINERIN) CREA Apply 1 application topically daily as needed (to both feet for dry skin).  . sucralfate (CARAFATE) 1 G  tablet Take 1 tablet by mouth daily.   . tamsulosin (FLOMAX) 0.4 MG CAPS capsule Take 1 capsule (0.4 mg total) by mouth daily after supper. (Patient not taking: Reported on 12/28/2017)  . trolamine salicylate (ASPERCREME/ALOE) 10 % cream Apply 1 application topically 2 (two) times daily.   No facility-administered encounter medications on file as of 06/19/2018.     PHYSICAL EXAM:  VS: BP 134/86, HR: 88, RR 16 General: NAD, frail appearing, thin Cardiovascular: regular rate and rhythm Pulmonary: clear ant fields Abdomen: soft, nontender, + bowel sounds GU: no suprapubic tenderness Extremities: no edema, no joint deformities Skin: no rashes Neurological: Weakness but otherwise nonfocal  Julianne Handler, NP

## 2018-06-20 ENCOUNTER — Encounter: Payer: Self-pay | Admitting: Internal Medicine

## 2018-08-20 DEATH — deceased
